# Patient Record
Sex: Female | Born: 1950 | Race: White | Hispanic: No | Marital: Married | Smoking: Never smoker
Health system: Southern US, Community
[De-identification: ages and names within clinical notes are randomized; demographics above are authoritative.]

## PROBLEM LIST (undated history)

## (undated) DIAGNOSIS — F419 Anxiety disorder, unspecified: Secondary | ICD-10-CM

## (undated) DIAGNOSIS — E785 Hyperlipidemia, unspecified: Secondary | ICD-10-CM

## (undated) DIAGNOSIS — D649 Anemia, unspecified: Secondary | ICD-10-CM

## (undated) DIAGNOSIS — C801 Malignant (primary) neoplasm, unspecified: Secondary | ICD-10-CM

## (undated) DIAGNOSIS — K649 Unspecified hemorrhoids: Secondary | ICD-10-CM

## (undated) DIAGNOSIS — M810 Age-related osteoporosis without current pathological fracture: Secondary | ICD-10-CM

## (undated) DIAGNOSIS — K5730 Diverticulosis of large intestine without perforation or abscess without bleeding: Secondary | ICD-10-CM

## (undated) DIAGNOSIS — I10 Essential (primary) hypertension: Secondary | ICD-10-CM

## (undated) DIAGNOSIS — K648 Other hemorrhoids: Secondary | ICD-10-CM

## (undated) DIAGNOSIS — H3530 Unspecified macular degeneration: Secondary | ICD-10-CM

## (undated) DIAGNOSIS — T7840XA Allergy, unspecified, initial encounter: Secondary | ICD-10-CM

## (undated) DIAGNOSIS — Z5189 Encounter for other specified aftercare: Secondary | ICD-10-CM

## (undated) DIAGNOSIS — H269 Unspecified cataract: Secondary | ICD-10-CM

## (undated) HISTORY — DX: Anemia, unspecified: D64.9

## (undated) HISTORY — PX: OTHER SURGICAL HISTORY: SHX169

## (undated) HISTORY — DX: Unspecified macular degeneration: H35.30

## (undated) HISTORY — DX: Encounter for other specified aftercare: Z51.89

## (undated) HISTORY — PX: CHOLECYSTECTOMY: SHX55

## (undated) HISTORY — DX: Other hemorrhoids: K64.8

## (undated) HISTORY — DX: Essential (primary) hypertension: I10

## (undated) HISTORY — DX: Diverticulosis of large intestine without perforation or abscess without bleeding: K57.30

## (undated) HISTORY — DX: Hyperlipidemia, unspecified: E78.5

## (undated) HISTORY — DX: Age-related osteoporosis without current pathological fracture: M81.0

## (undated) HISTORY — PX: COLONOSCOPY: SHX174

## (undated) HISTORY — PX: CATARACT EXTRACTION: SUR2

## (undated) HISTORY — DX: Malignant (primary) neoplasm, unspecified: C80.1

## (undated) HISTORY — DX: Unspecified cataract: H26.9

## (undated) HISTORY — DX: Allergy, unspecified, initial encounter: T78.40XA

## (undated) HISTORY — DX: Anxiety disorder, unspecified: F41.9

## (undated) HISTORY — PX: ABDOMINAL HYSTERECTOMY: SHX81

## (undated) HISTORY — DX: Unspecified hemorrhoids: K64.9

---

## 1974-03-19 DIAGNOSIS — Z5189 Encounter for other specified aftercare: Secondary | ICD-10-CM

## 1974-03-19 HISTORY — DX: Encounter for other specified aftercare: Z51.89

## 2001-07-07 ENCOUNTER — Encounter: Payer: Self-pay | Admitting: Internal Medicine

## 2001-07-07 ENCOUNTER — Encounter: Admission: RE | Admit: 2001-07-07 | Discharge: 2001-07-07 | Payer: Self-pay | Admitting: Internal Medicine

## 2001-09-08 ENCOUNTER — Encounter: Payer: Self-pay | Admitting: Gastroenterology

## 2001-09-08 ENCOUNTER — Encounter: Admission: RE | Admit: 2001-09-08 | Discharge: 2001-09-08 | Payer: Self-pay | Admitting: Gastroenterology

## 2004-02-02 ENCOUNTER — Ambulatory Visit: Payer: Self-pay | Admitting: Internal Medicine

## 2004-03-29 ENCOUNTER — Ambulatory Visit: Payer: Self-pay | Admitting: Internal Medicine

## 2004-07-12 ENCOUNTER — Ambulatory Visit: Payer: Self-pay | Admitting: Internal Medicine

## 2004-08-09 ENCOUNTER — Ambulatory Visit: Payer: Self-pay | Admitting: Internal Medicine

## 2005-03-28 ENCOUNTER — Ambulatory Visit: Payer: Self-pay | Admitting: Internal Medicine

## 2005-04-18 ENCOUNTER — Ambulatory Visit: Payer: Self-pay | Admitting: Internal Medicine

## 2005-05-09 ENCOUNTER — Ambulatory Visit: Payer: Self-pay | Admitting: Internal Medicine

## 2005-07-25 ENCOUNTER — Ambulatory Visit: Payer: Self-pay | Admitting: Family Medicine

## 2006-03-19 HISTORY — PX: COLON SURGERY: SHX602

## 2006-05-07 ENCOUNTER — Ambulatory Visit: Payer: Self-pay | Admitting: Family Medicine

## 2006-07-04 ENCOUNTER — Ambulatory Visit: Payer: Self-pay | Admitting: Internal Medicine

## 2006-08-02 ENCOUNTER — Ambulatory Visit: Payer: Self-pay | Admitting: Gastroenterology

## 2006-08-22 ENCOUNTER — Encounter: Payer: Self-pay | Admitting: Internal Medicine

## 2006-08-22 ENCOUNTER — Ambulatory Visit: Payer: Self-pay | Admitting: Internal Medicine

## 2006-10-02 ENCOUNTER — Ambulatory Visit: Payer: Self-pay | Admitting: Internal Medicine

## 2006-10-02 LAB — CONVERTED CEMR LAB
BUN: 13 mg/dL (ref 6–23)
Creatinine, Ser: 0.9 mg/dL (ref 0.4–1.2)

## 2006-10-07 ENCOUNTER — Ambulatory Visit: Payer: Self-pay | Admitting: Cardiovascular Disease

## 2006-12-19 ENCOUNTER — Encounter: Payer: Self-pay | Admitting: Internal Medicine

## 2006-12-19 ENCOUNTER — Ambulatory Visit: Payer: Self-pay | Admitting: Internal Medicine

## 2006-12-19 DIAGNOSIS — K6389 Other specified diseases of intestine: Secondary | ICD-10-CM

## 2006-12-19 DIAGNOSIS — I10 Essential (primary) hypertension: Secondary | ICD-10-CM | POA: Insufficient documentation

## 2007-01-09 ENCOUNTER — Inpatient Hospital Stay (HOSPITAL_COMMUNITY): Admission: RE | Admit: 2007-01-09 | Discharge: 2007-01-15 | Payer: Self-pay | Admitting: General Surgery

## 2007-01-09 ENCOUNTER — Encounter (INDEPENDENT_AMBULATORY_CARE_PROVIDER_SITE_OTHER): Payer: Self-pay | Admitting: General Surgery

## 2007-01-22 ENCOUNTER — Encounter: Payer: Self-pay | Admitting: Internal Medicine

## 2007-06-03 DIAGNOSIS — E785 Hyperlipidemia, unspecified: Secondary | ICD-10-CM

## 2007-06-03 DIAGNOSIS — Z9089 Acquired absence of other organs: Secondary | ICD-10-CM | POA: Insufficient documentation

## 2007-06-03 DIAGNOSIS — Z8659 Personal history of other mental and behavioral disorders: Secondary | ICD-10-CM

## 2007-06-03 DIAGNOSIS — Z860100 Personal history of colon polyps, unspecified: Secondary | ICD-10-CM | POA: Insufficient documentation

## 2007-06-04 ENCOUNTER — Ambulatory Visit: Payer: Self-pay | Admitting: Internal Medicine

## 2007-06-04 DIAGNOSIS — M79609 Pain in unspecified limb: Secondary | ICD-10-CM

## 2007-06-09 ENCOUNTER — Telehealth: Payer: Self-pay | Admitting: Internal Medicine

## 2007-07-16 ENCOUNTER — Encounter: Payer: Self-pay | Admitting: Internal Medicine

## 2007-07-27 ENCOUNTER — Encounter: Payer: Self-pay | Admitting: Internal Medicine

## 2007-12-15 IMAGING — CT CT PELVIS W/ CM
2 of 5 series · 16 of 46 positions shown, 18 images · IV contrast (omnipaque)
Comparison: CT abdomen and pelvis 09/08/2001 from [REDACTED].

CLINICAL DATA: Left lower quadrant abdominal pain and left pelvic pain.
History of diverticulitis. Remote history of colonic resection in 6953.

CT ABDOMEN AND PELVIS WITH CONTRAST 10/07/2006:
TECHNIQUE: Multidetector helical CT of the abdomen and pelvis was performed
during bolus administration of intravenous contrast. Oral contrast was given.
Delayed imaging through the kidneys was performed.
Contrast:  150 cc Omnipaque 300. Of note, the patient did experience difficulty
swallowing approximately 5 minutes after contrast administration for which she
was given 50 mg of oral Benadryl and 60 mg of intravenous Solu-Medrol per Dr.
Doby.

[Series 2: abd_pel_xxl 5.0 b10f st · axial · 0.95mm/px · z∈[-453,-28]mm · 13 of 96 slices shown, 15 images]
[im 6/96  soft-tissue]
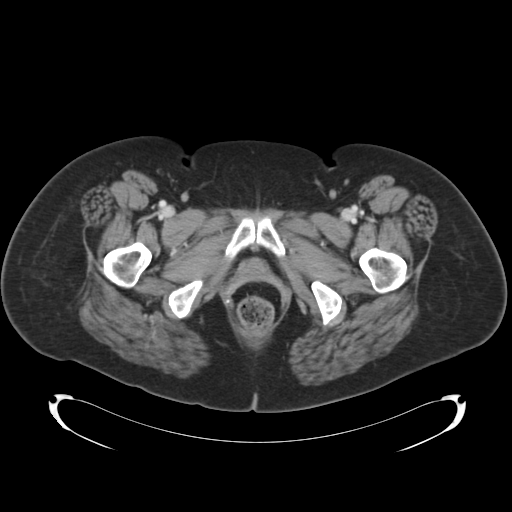
[im 6/96  bone]
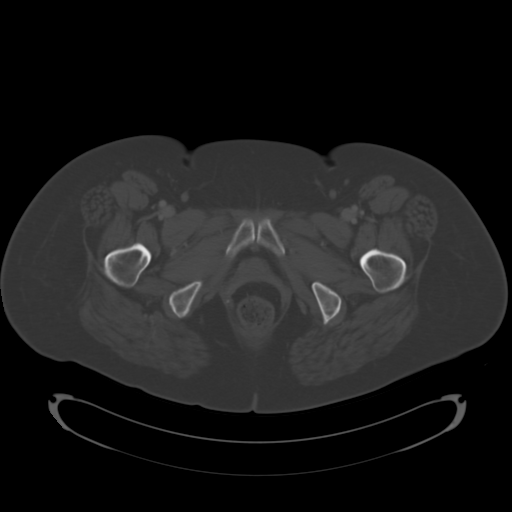
[im 16/96  soft-tissue]
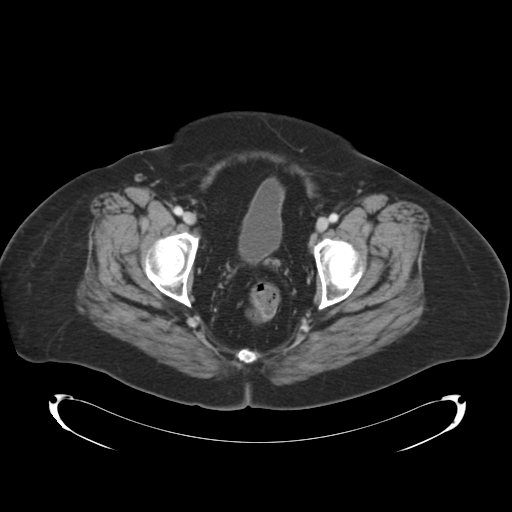
[im 21/96  soft-tissue]
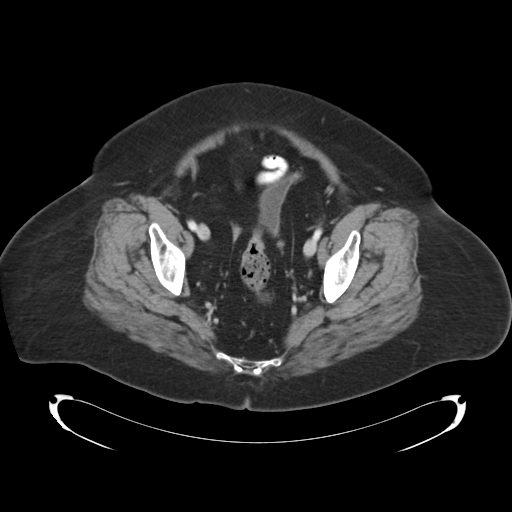
[im 26/96  soft-tissue]
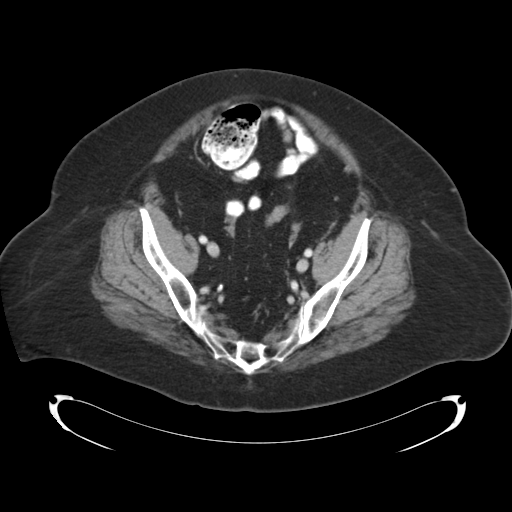
[im 36/96  soft-tissue]
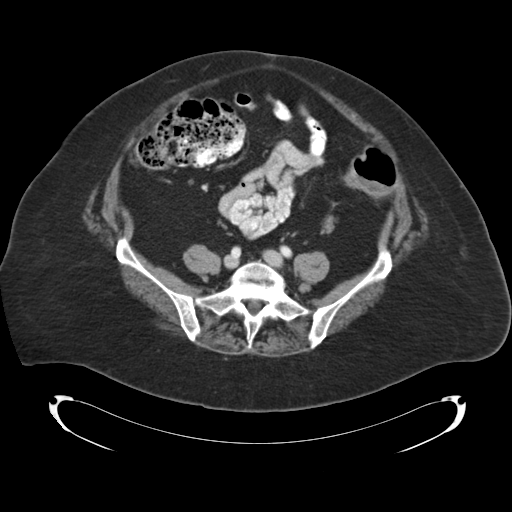
[im 41/96  soft-tissue]
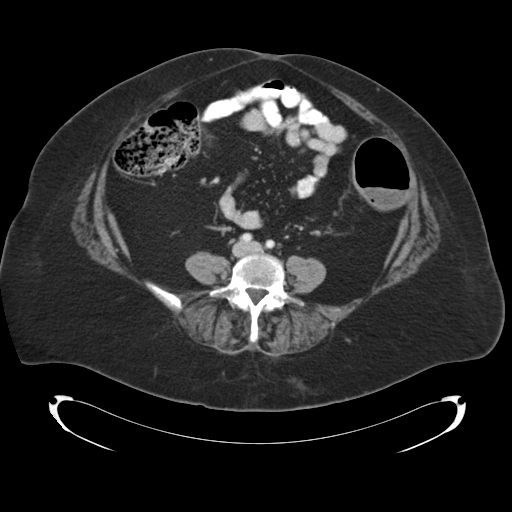
[im 51/96  soft-tissue]
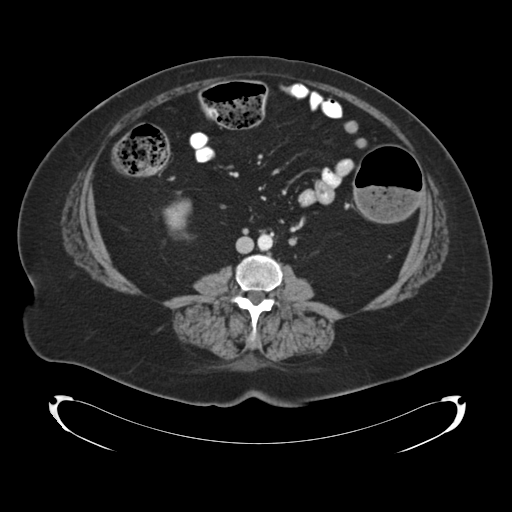
[im 56/96  soft-tissue]
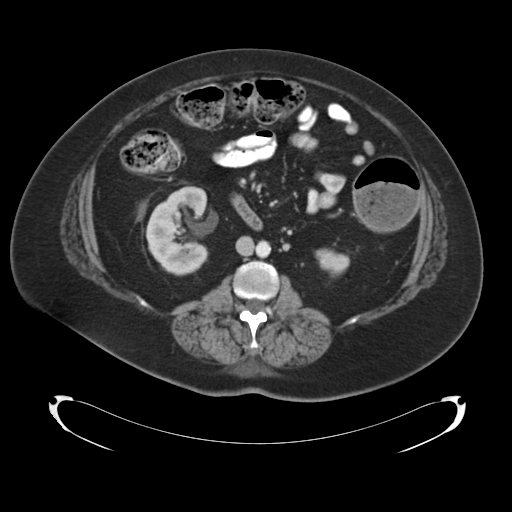
[im 61/96  soft-tissue]
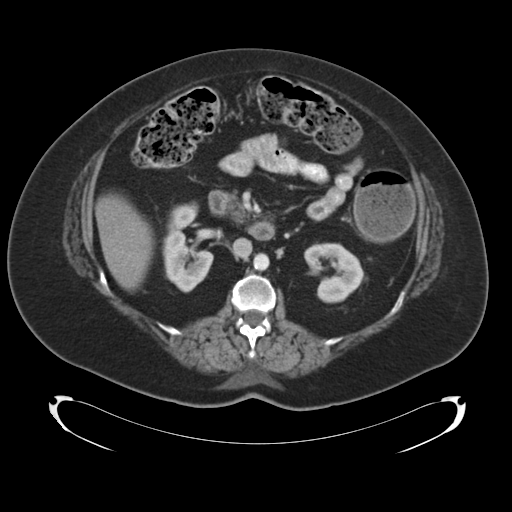
[im 61/96  bone]
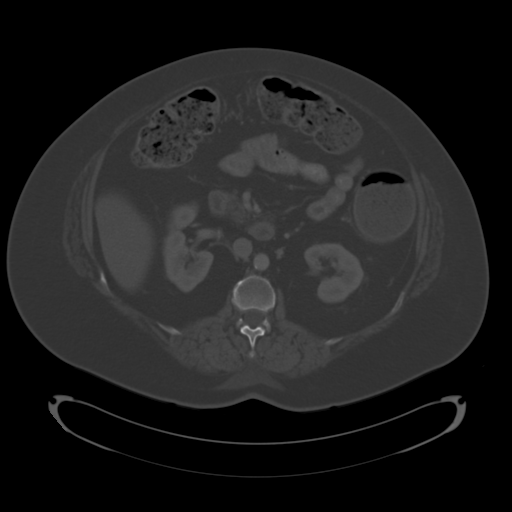
[im 71/96  soft-tissue]
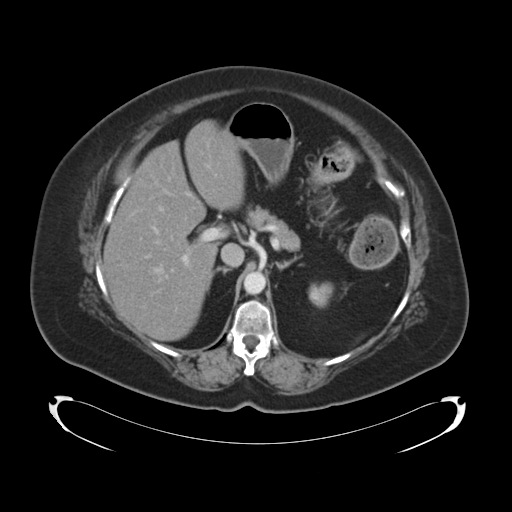
[im 76/96  soft-tissue]
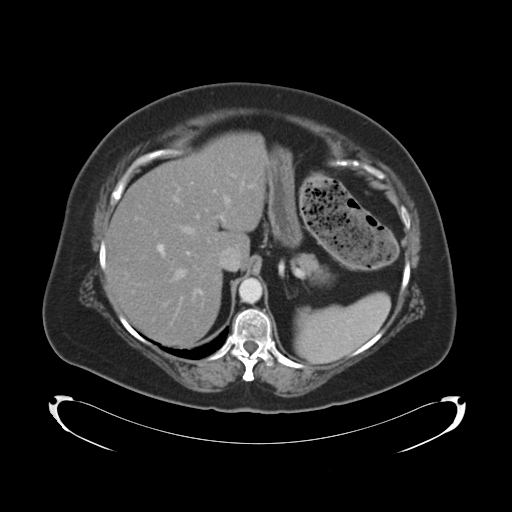
[im 81/96  soft-tissue]
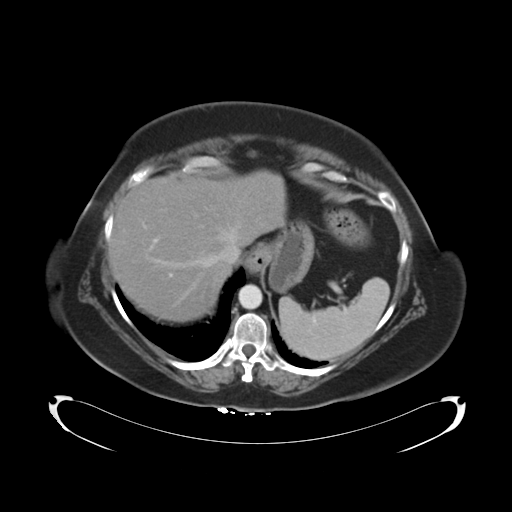
[im 91/96  soft-tissue]
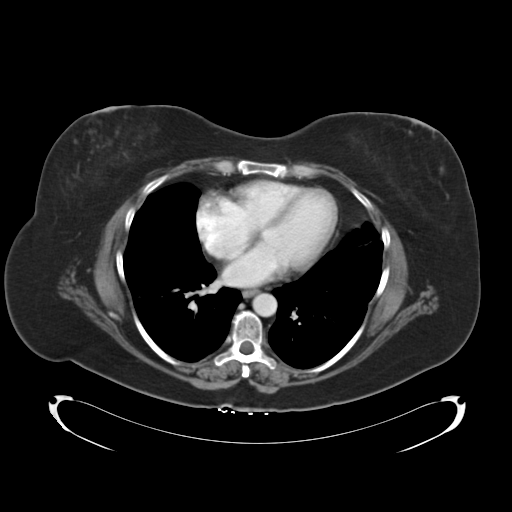

[Series 602: <mpr thick range> · coronal · 0.98mm/px · 3 of 109 slices shown]
[im 37/109  soft-tissue]
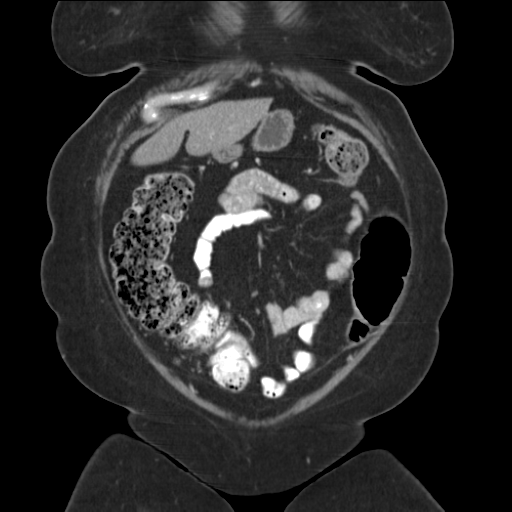
[im 49/109  soft-tissue]
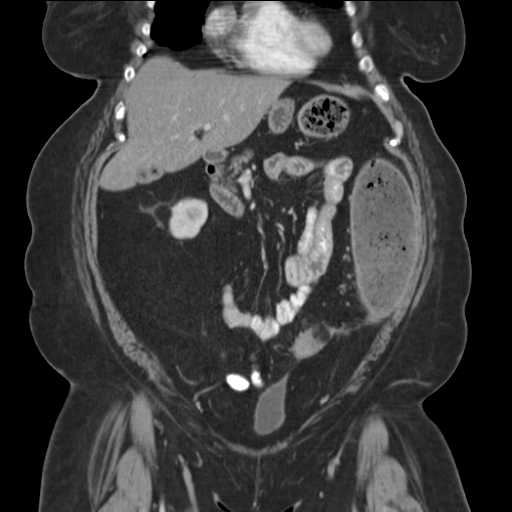
[im 61/109  soft-tissue]
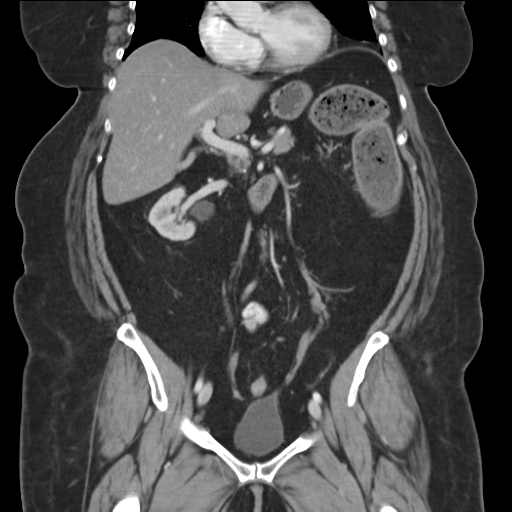

[16 of 46 positions shown; findings below may reference images not displayed]

CT ABDOMEN:

Mild diffuse fatty infiltration of the liver without focal hepatic
abnormalities. Normal appearing spleen, pancreas, adrenal glands, and kidneys.
Numerous cholesterol gallstones again demonstrated within a contracted
gallbladder. Small calcified stone within the cystic duct again demonstrated. No
CT evidence for acute cholecystitis. No biliary ductal dilation. Stomach and
visualized small bowel unremarkable. Large amount of stool throughout the
visualized colon, with scattered diverticula noted involving the ascending and
transverse colon. No ascites. Minimal abdominal aortic atherosclerosis. No
significant lymphadenopathy. Linear scarring again demonstrated in both lower
lobes, the right middle lobe, and lingula. Degenerative changes noted throughout
the lower thoracic and lumbar spine.
IMPRESSION: 1. Mild diffuse fatty infiltration of the liver without focal hepatic
abnormalities.
2. Constipation.
3. Scattered ascending and transverse colonic diverticula without evidence of
acute diverticulitis.
4. Cholelithiasis with cholesterol gallstones in the gallbladder and small
calcified gallstone in the cystic duct, unchanged since the prior CT from August 2001. No CT evidence for cholecystitis or biliary ductal dilation.

CT PELVIS:

Segmental narrowing of the proximal sigmoid colon, and several small diverticula
in this region. Scarring in the soft tissues adjacent to the sigmoid colon, but
no convincing evidence for acute inflammation to confirm acute diverticulitis.
Stool is noted within normal caliber rectum. Descending colon proximal to the
narrowed sigmoid mildly distended containing liquid stool. Visualized small
bowel unremarkable. Uterus surgically absent. No adnexal masses or free fluid.
Urinary bladder somewhat tethered by the scarring in the retroperitoneum but
otherwise unremarkable. No significant lymphadenopathy.
IMPRESSION: 1. Segmental narrowing of the proximal sigmoid colon, with mild distention of
the descending colon proximal to this. Stool is present within normal caliber
distal sigmoid colon and rectum, so high-grade obstruction not suspected. 
2. Scarring from chronic diverticulitis is favored over this representing an
acute diverticulitis, as no pericolonic edema or inflammation is identified.
3. Otherwise unremarkable CT pelvis, status post hysterectomy.

## 2008-03-03 ENCOUNTER — Ambulatory Visit: Payer: Self-pay | Admitting: Internal Medicine

## 2008-03-03 DIAGNOSIS — K432 Incisional hernia without obstruction or gangrene: Secondary | ICD-10-CM | POA: Insufficient documentation

## 2008-07-26 ENCOUNTER — Encounter: Payer: Self-pay | Admitting: Internal Medicine

## 2008-12-22 ENCOUNTER — Ambulatory Visit: Payer: Self-pay | Admitting: Internal Medicine

## 2008-12-22 LAB — CONVERTED CEMR LAB
Albumin: 4 g/dL (ref 3.5–5.2)
Basophils Absolute: 0.1 10*3/uL (ref 0.0–0.1)
Chloride: 98 meq/L (ref 96–112)
Cholesterol: 186 mg/dL (ref 0–200)
Creatinine, Ser: 0.8 mg/dL (ref 0.4–1.2)
Direct LDL: 111.1 mg/dL
HDL: 42 mg/dL (ref >39.00)
Lymphocytes Relative: 26.4 % (ref 12.0–46.0)
Monocytes Relative: 7.2 % (ref 3.0–12.0)
Platelets: 260 10*3/uL (ref 150.0–400.0)
Potassium: 3.7 meq/L (ref 3.5–5.1)
RDW: 12.5 % (ref 11.5–14.6)
Total Protein: 7.9 g/dL (ref 6.0–8.3)
Triglycerides: 268 mg/dL — ABNORMAL HIGH (ref 0.0–149.0)
VLDL: 53.6 mg/dL — ABNORMAL HIGH (ref 0.0–40.0)

## 2009-06-30 ENCOUNTER — Ambulatory Visit: Payer: Self-pay | Admitting: Internal Medicine

## 2009-06-30 DIAGNOSIS — M810 Age-related osteoporosis without current pathological fracture: Secondary | ICD-10-CM | POA: Insufficient documentation

## 2009-07-28 ENCOUNTER — Encounter: Payer: Self-pay | Admitting: Internal Medicine

## 2009-09-09 ENCOUNTER — Telehealth: Payer: Self-pay | Admitting: Internal Medicine

## 2010-04-20 NOTE — Progress Notes (Signed)
Summary: Results  Phone Note Other Incoming   Caller: pt  Summary of Call: Bone Density and mammogram results.  Initial call taken by: Ami Bullins CMA,  September 09, 2009 2:48 PM  Follow-up for Phone Call        both done a [facility] and reviewed; 1) normal bone density 2) normal mammogram Follow-up by: Jacques Navy MD,  September 09, 2009 4:05 PM  Additional Follow-up for Phone Call Additional follow up Details #1::        Patient notified, offered to mail and patient notified that she already has a copy. Additional Follow-up by: Lucious Groves,  September 09, 2009 4:10 PM

## 2010-04-20 NOTE — Assessment & Plan Note (Signed)
Summary: per pt/very stressed and want to see MD-lb   Vital Signs:  Patient profile:   61 year old female Height:      63 inches (160.02 cm) Weight:      229.50 pounds (104.32 kg) BMI:     40.80 O2 Sat:      97 % on Room air Temp:     97.8 degrees F (36.56 degrees C) oral Pulse rate:   59 / minute Pulse rhythm:   regular BP sitting:   120 / 72  (left arm) Cuff size:   large  Vitals Entered By: Brenton Grills (June 30, 2009 9:24 AM)  O2 Flow:  Room air CC: pt here to discuss stress/wants to know if she can have bone density and mammogram on same day since both are due/refill request on clorazepate/aj   Primary Care Provider:  [facility]  CC:  pt here to discuss stress/wants to know if she can have bone density and mammogram on same day since both are due/refill request on clorazepate/aj.  History of Present Illness: Patient presents for management of stress: taxes, husbands back trouble. She does take clorazepate 7.5 mg qid. With this she still is having trouble with anxiety. She does feel overwhelmed and somewhat helpless. She has a history of panic disorder with agraphobia.   She is due for DXA and needs a referral   Her gynecologist has recommended that she take vitamin D. She is not taking any supplementation. She had a pelvic/PAP last year.      Current Medications (verified): 1)  Simvastatin 10 Mg  Tabs (Simvastatin) .... Once Daily 2)  Clorazepate Dipotassium 7.5 Mg  Tabs (Clorazepate Dipotassium) .... 4 Times A Day 3)  Bisoprolol Fumarate 10 Mg Tabs (Bisoprolol Fumarate) .Marland Kitchen.. 1 By Mouth Once Daily 4)  Fosinopril Sodium-Hctz 20-12.5 Mg  Tabs (Fosinopril Sodium-Hctz) .... Once Daily 5)  Bufferin 325 Mg  Tabs (Aspirin Buf(Cacarb-Mgcarb-Mgo)) .... As Needed  Allergies: 1)  ! Sulfa 2)  ! * Minocycline 3)  ! 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) HYPERTENSION (ICD-401.9) ANXIETY, CHRONIC, HX OF (ICD-V11.2)      Past Surgical History: Last updated: 07/04/2007 * LAPAROTOMY AND REMOVAL OF COLON POLYP CHOLECYSTECTOMY, HX OF (ICD-V45.79) * SIGMOID COLECTOMY * Hx of HYSTERECTOMY  Social History: Last updated: 12/19/2006 married 1968 2 sons, 1 daughter   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  The patient denies anorexia, fever, weight loss, weight gain, chest pain, syncope, prolonged cough, abdominal pain, severe indigestion/heartburn, hematuria, muscle weakness, difficulty walking, depression, enlarged lymph nodes, and angioedema.    Physical Exam  General:  alert, well-developed, well-nourished, and well-hydrated.   Head:  normocephalic and atraumatic.   Eyes:  vision grossly intact, pupils equal, pupils round, corneas and lenses clear, and no injection.   Neck:  supple, full ROM, and no thyromegaly.   Lungs:  normal respiratory effort and normal breath sounds.   Heart:  normal rate and regular rhythm.   Abdomen:  obese, BS +, no tenderness Msk:  normal ROM, no joint swelling, no joint warmth, and no joint instability.   Pulses:  2+ radial pulse Neurologic:  alert & oriented X3, cranial nerves II-XII intact, gait normal, and DTRs symmetrical and normal.   Skin:  turgor normal, color normal, no rashes, and no suspicious lesions.   Cervical Nodes:  no anterior cervical adenopathy and no posterior cervical adenopathy.   Psych:  Oriented X3, memory intact for recent and remote, normally interactive, and good eye  contact.     Impression & Recommendations:  Problem # 1:  ANXIETY, CHRONIC, HX OF (ICD-V11.2) Patient with increased anxiety and worry.  Plan - start sertraline 50mg  once daily            continue clorazepate 7.5  Problem # 2:  Preventive Health Care (ICD-V70.0) recommend Vit D supplementation 800iu daily  Complete Medication List: 1)  Simvastatin 10 Mg Tabs (Simvastatin) .... Once daily 2)  Clorazepate Dipotassium 7.5 Mg Tabs (Clorazepate dipotassium) .... 4 times a day 3)  Bisoprolol Fumarate 10 Mg Tabs (Bisoprolol fumarate) .Marland Kitchen.. 1 by mouth once daily 4)  Fosinopril Sodium-hctz 20-12.5 Mg Tabs (Fosinopril sodium-hctz) .... Once daily 5)  Bufferin 325 Mg Tabs (Aspirin buf(cacarb-mgcarb-mgo)) .... As needed 6)  Sertraline Hcl 50 Mg Tabs (Sertraline hcl) .Marland Kitchen.. 1 by mouth once daily  Other Orders: Radiology Referral (Radiology)  Patient Instructions: 1)  anxiety - too much stress and worry. Plan - continue clorazepate. Add sertraline 50mg  daily. 2)  Bone health - you should be getting 1200mg  daily of calcium as diet and supplement combined. You need Vitamin D 400iu twice a day (800 international units daily) Prescriptions: CLORAZEPATE DIPOTASSIUM 7.5 MG  TABS (CLORAZEPATE DIPOTASSIUM) 4 times a day  #360 x 1   Entered and Authorized by:   Jacques Navy MD   Signed by:   Jacques Navy MD on 06/30/2009   Method used:   Print then Give to Patient   RxID:   [id number] SERTRALINE HCL 50 MG TABS (SERTRALINE HCL) 1 by mouth once daily  #30 x 12   Entered and Authorized by:   Jacques Navy MD   Signed by:   Jacques Navy MD on 06/30/2009   Method used:   Print then Give to Patient   RxID:   [id number]

## 2010-05-25 ENCOUNTER — Other Ambulatory Visit: Payer: 59

## 2010-05-25 ENCOUNTER — Encounter: Payer: Self-pay | Admitting: Internal Medicine

## 2010-05-25 ENCOUNTER — Other Ambulatory Visit: Payer: Self-pay | Admitting: Internal Medicine

## 2010-05-25 ENCOUNTER — Ambulatory Visit (INDEPENDENT_AMBULATORY_CARE_PROVIDER_SITE_OTHER): Payer: 59 | Admitting: Internal Medicine

## 2010-05-25 DIAGNOSIS — Z8659 Personal history of other mental and behavioral disorders: Secondary | ICD-10-CM

## 2010-05-25 DIAGNOSIS — N39498 Other specified urinary incontinence: Secondary | ICD-10-CM

## 2010-05-25 DIAGNOSIS — I10 Essential (primary) hypertension: Secondary | ICD-10-CM

## 2010-05-25 DIAGNOSIS — E785 Hyperlipidemia, unspecified: Secondary | ICD-10-CM

## 2010-05-25 DIAGNOSIS — K644 Residual hemorrhoidal skin tags: Secondary | ICD-10-CM

## 2010-05-25 LAB — CBC WITH DIFFERENTIAL/PLATELET
Basophils Relative: 0.6 % (ref 0.0–3.0)
Eosinophils Relative: 3.6 % (ref 0.0–5.0)
Lymphocytes Relative: 24.9 % (ref 12.0–46.0)
MCV: 92.2 fl (ref 78.0–100.0)
Monocytes Relative: 7.9 % (ref 3.0–12.0)
Neutrophils Relative %: 63 % (ref 43.0–77.0)
RBC: 4.2 Mil/uL (ref 3.87–5.11)
WBC: 5.1 10*3/uL (ref 4.5–10.5)

## 2010-05-25 LAB — LIPID PANEL
Cholesterol: 173 mg/dL (ref 0–200)
HDL: 43.5 mg/dL (ref >39.00)
VLDL: 60.4 mg/dL — ABNORMAL HIGH (ref 0.0–40.0)

## 2010-05-25 LAB — HEPATIC FUNCTION PANEL
Albumin: 3.9 g/dL (ref 3.5–5.2)
Alkaline Phosphatase: 82 U/L (ref 39–117)
Total Protein: 7.3 g/dL (ref 6.0–8.3)

## 2010-05-25 LAB — BASIC METABOLIC PANEL
Chloride: 103 mEq/L (ref 96–112)
Creatinine, Ser: 0.9 mg/dL (ref 0.4–1.2)
GFR: 65.26 mL/min (ref >60.00)

## 2010-05-30 NOTE — Assessment & Plan Note (Signed)
Summary: DISCUSS BLADDER LEAKAGE---STC   Vital Signs:  Patient profile:   60 year old female Height:      63 inches Weight:      231 pounds BMI:     41.07 O2 Sat:      97 % on Room air Temp:     98.1 degrees F oral Pulse rate:   63 / minute BP sitting:   132 / 82  (left arm) Cuff size:   large  Vitals Entered By: Bill Salinas CMA (May 25, 2010 10:49 AM)  O2 Flow:  Room air CC: ov to discuss bladder leakage.ab   Primary Care Provider:  Trinitee Horgan  CC:  ov to discuss bladder leakage.ab.  History of Present Illness: Mrs. Paulson prsents with a complaint of incontinence especially when she first gets up. She also has urinary frequency. She is concerned about irritible bladder. No signs of infection.  She last had lab in October '10 and is due. She has been taking her medications.   She is current with mammograms and bone density study.  She has no general medical complaints except as above.   Current Medications (verified): 1)  Simvastatin 10 Mg  Tabs (Simvastatin) .... Once Daily 2)  Clorazepate Dipotassium 7.5 Mg  Tabs (Clorazepate Dipotassium) .... 4 Times A Day 3)  Bisoprolol Fumarate 10 Mg Tabs (Bisoprolol Fumarate) .Marland Kitchen.. 1 By Mouth Once Daily 4)  Fosinopril Sodium-Hctz 20-12.5 Mg  Tabs (Fosinopril Sodium-Hctz) .... Once Daily 5)  Bufferin 325 Mg  Tabs (Aspirin Buf(Cacarb-Mgcarb-Mgo)) .... As Needed 6)  Sertraline Hcl 50 Mg Tabs (Sertraline Hcl) .Marland Kitchen.. 1 By Mouth Once Daily  Allergies (verified): 1)  ! Sulfa 2)  ! * Minocycline 3)  ! 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) HYPERTENSION (ICD-401.9) ANXIETY, CHRONIC, HX OF (ICD-V11.2)      Past Surgical History: Last updated: 07/04/2007 *  LAPAROTOMY AND REMOVAL OF COLON POLYP CHOLECYSTECTOMY, HX OF (ICD-V45.79) * SIGMOID COLECTOMY * Hx of HYSTERECTOMY  Family History: Last updated: July 04, 2009 father - deceased @ 14: CAD/MI, DM mother - 1932: dillusional disorder, fibromyalgia, DDD, HTN Neg- colon cancer MAunt - breast cancer Brother - DM  Social History: Last updated: 12/19/2006 married 1968 2 sons, 1 daughter   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    Physical Exam  General:  overweight white woman in no distress Head:  normocephalic and atraumatic.   Eyes:  C&S clear Neck:  supple and full ROM.   Lungs:  normal respiratory effort, normal breath sounds, no crackles, and no wheezes.   Heart:  normal rate and regular rhythm.   Abdomen:  Bowel sounds positive,abdomen soft and non-tender without masses, organomegaly or hernias noted. Msk:  normal ROM, no joint tenderness, no joint swelling, and no joint warmth.   Pulses:  2+  radial pulse Extremities:  No peripheral edema Neurologic:  alert & oriented X3, cranial nerves II-XII intact, strength normal in all extremities, gait normal, and DTRs symmetrical and normal.   Skin:  turgor normal and color normal.   Psych:  Oriented X3, memory intact for recent and remote, normally interactive,  and good eye contact.     Impression & Recommendations:  Problem # 1:  DEPRESSION, CHRONIC, HX OF (ICD-V11.8) Stable - meds renewed  Problem # 2:  DYSLIPIDEMIA (ICD-272.4) Due for lab with recommendations to follow.  Her updated medication list for this problem includes:    Simvastatin 10 Mg Tabs (Simvastatin) ..... Once daily  Orders: TLB-Lipid Panel (80061-LIPID) TLB-Hepatic/Liver Function Pnl  (80076-HEPATIC)  Addendum - LDL 99 - at goal  Plan - continue present meds  Problem # 3:  HYPERTENSION (ICD-401.9)  Her updated medication list for this problem includes:    Bisoprolol Fumarate 10 Mg Tabs (Bisoprolol fumarate) .Marland Kitchen... 1 by mouth once daily    Fosinopril Sodium-hctz 20-12.5 Mg Tabs (Fosinopril sodium-hctz) ..... Once daily  Orders: TLB-BMP (Basic Metabolic Panel-BMET) (80048-METABOL)  BP today: 132/82 Prior BP: 120/72 (06/30/2009)  Adequate control. Will check basic metabolic panel  Problem # 4:  STRESS INCONTINENCE (ICD-788.39) By descripton patient has stress incontinence. she is concerned for irritible bladder having seen an add and clipped a coupon for a 30 day supply of vesicare.   Plan - patient needs to do [place] exercise (available by going to [employer])           trial of vesicare 5mg  once daily (#7 given as sample)  Complete Medication List: 1)  Simvastatin 10 Mg Tabs (Simvastatin) .... Once daily 2)  Clorazepate Dipotassium 7.5 Mg Tabs (Clorazepate dipotassium) .... 4 times a day 3)  Bisoprolol Fumarate 10 Mg Tabs (Bisoprolol fumarate) .Marland Kitchen.. 1 by mouth once daily 4)  Fosinopril Sodium-hctz 20-12.5 Mg Tabs (Fosinopril sodium-hctz) .... Once daily 5)  Bufferin 325 Mg Tabs (Aspirin buf(cacarb-mgcarb-mgo)) .... As needed 6)  Sertraline Hcl 50 Mg Tabs (Sertraline hcl) .Marland Kitchen.. 1 by mouth once daily  Other Orders: TLB-CBC Platelet - w/Differential (85025-CBCD)  Patient: Stephanie Pham Note: All result statuses are Final unless otherwise noted.  Tests: (1) Lipid Panel (LIPID)   Cholesterol               173 mg/dL                   0-454     ATP III Classification            Desirable:  < 200 mg/dL                    Borderline High:  200 - 239 mg/dL               High:  > = 240 mg/dL   Triglycerides        [H]  302.0 mg/dL                 0.9-811.9     Normal:  <150 mg/dL     Borderline High:  147 - 199 mg/dL   HDL                       82.95 mg/dL                  >62.13   VLDL Cholesterol     [H]  60.4 mg/dL                  0.8-65.7  CHO/HDL Ratio:  CHD Risk                             4  Men          Women     1/2 Average Risk     3.4          3.3     Average Risk          5.0          4.4     2X Average Risk          9.6          7.1     3X Average Risk          15.0          11.0                           Tests: (2) Hepatic/Liver Function Panel (HEPATIC)   Total Bilirubin           0.9 mg/dL                   7.5-6.4   Direct Bilirubin          0.2 mg/dL                   3.3-2.9   Alkaline Phosphatase      82 U/L                      39-117   AST                  [H]  57 U/L                      0-37   ALT                  [H]  67 U/L                      0-35   Total Protein             7.3 g/dL                    5.1-8.8   Albumin                   3.9 g/dL                    4.1-6.6  Tests: (3) BMP (METABOL)   Sodium                    141 mEq/L                   135-145   Potassium                 3.7 mEq/L                   3.5-5.1   Chloride                  103 mEq/L                   96-112   Carbon Dioxide            29 mEq/L                    19-32   Glucose  99 mg/dL                    16-10   BUN                       10 mg/dL                    9-60   Creatinine                0.9 mg/dL                   4.5-4.0   Calcium                   9.0 mg/dL                   9.8-11.9   GFR                       65.26 mL/min                >60.00  Tests: (4) CBC Platelet w/Diff (CBCD)   White Cell Count          5.1 K/uL                    4.5-10.5   Red Cell Count            4.20 Mil/uL                 3.87-5.11   Hemoglobin                13.5 g/dL                   14.7-82.9   Hematocrit                38.8 %                      36.0-46.0   MCV                       92.2 fl                     78.0-100.0   MCHC                      34.8 g/dL                   56.2-13.0   RDW                        13.1 %                      11.5-14.6   Platelet Count            265.0 K/uL                  150.0-400.0   Neutrophil %              63.0 %                      43.0-77.0   Lymphocyte %              24.9 %  12.0-46.0   Monocyte %                7.9 %                       3.0-12.0   Eosinophils%              3.6 %                       0.0-5.0   Basophils %               0.6 %                       0.0-3.0   Neutrophill Absolute      3.2 K/uL                    1.4-7.7   Lymphocyte Absolute       1.3 K/uL                    0.7-4.0   Monocyte Absolute         0.4 K/uL                    0.1-1.0  Eosinophils, Absolute                             0.2 K/uL                    0.0-0.7   Basophils Absolute        0.0 K/uL                    0.0-0.1  Tests: (5) Cholesterol LDL - Direct (DIRLDL)  Cholesterol LDL - Direct                             99.7 mg/dL     Optimal:  <161 mg/dL     Near or Above Optimal:  100-129 mg/dL     Borderline High:  096-045 mg/dL     High:  409-811 mg/dL     Very High:  >914 mg/dL Prescriptions: CLORAZEPATE DIPOTASSIUM 7.5 MG  TABS (CLORAZEPATE DIPOTASSIUM) 4 times a day  #360 x 1   Entered and Authorized by:   Jacques Navy MD   Signed by:   Jacques Navy MD on 05/25/2010   Method used:   Print then Give to Patient   RxID:   [id number]    Orders Added: 1)  TLB-Lipid Panel [80061-LIPID] 2)  TLB-Hepatic/Liver Function Pnl [80076-HEPATIC] 3)  TLB-BMP (Basic Metabolic Panel-BMET) [80048-METABOL] 4)  TLB-CBC Platelet - w/Differential [85025-CBCD] 5)  Est. Patient Level IV [69629]

## 2010-06-05 ENCOUNTER — Telehealth: Payer: Self-pay | Admitting: Internal Medicine

## 2010-06-15 NOTE — Progress Notes (Signed)
Summary: Rx request  Phone Note Call from Patient Call back at Home Phone [phone]   Caller: Patient Reason for Call: Refill Medication Summary of Call: Pt states that she was given samples of Vesicare to try for bladder incontinence. Pt states that this med is working and is requesting that a #30 Rx be called into [facility] [phone]. Is Ok to send Rx to pharmacy.? Initial call taken by: Burnard Leigh [facility]),  June 05, 2010 10:49 AM  Follow-up for Phone Call        ok for vesicare 5mg  # 30 , sig 1 by mouth once daily; as needed refills Follow-up by: Jacques Navy MD,  June 05, 2010 12:57 PM    New/Updated Medications: VESICARE 5 MG TABS (SOLIFENACIN SUCCINATE) 1 once daily Prescriptions: VESICARE 5 MG TABS (SOLIFENACIN SUCCINATE) 1 once daily  #90 x 3   Entered by:   Lamar Sprinkles, CMA   Authorized by:   Jacques Navy MD   Signed by:   Lamar Sprinkles, CMA on 06/05/2010   Method used:   Electronically to        [facility]  [address] [phone]* (retail)       [address]       [place], [place]  [address]       Ph: [phone]       Fax: [phone]   RxID:   [id number]

## 2010-08-01 NOTE — Discharge Summary (Signed)
Stephanie Pham, Stephanie Pham              ACCOUNT NO.:  [id number]   MEDICAL RECORD NO.:  [id number]          PATIENT TYPE:  INP   LOCATION:  5742                         FACILITY:  [facility]   PHYSICIAN:  Gabrielle Dare. Janee Morn, M.D.DATE OF BIRTH:  [birth year]   DATE OF ADMISSION:  01/09/2007  DATE OF DISCHARGE:  01/15/2007                               DISCHARGE SUMMARY   DISCHARGE DIAGNOSES:  1. Symptomatic cholelithiasis.  2. 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  3. Status post cholecystectomy and sigmoid colectomy.  4. Hypertension.  5. Anxiety disorder.   HISTORY OF PRESENT ILLNESS:  Stephanie Pham is a 60 year old female who I  evaluated in the office for several mild bouts of diverticulitis.  A CT  scan revealed a stricture in her proximal sigmoid colon.  In addition,  she has had gallstones for some time with symptoms on an intermittent  basis.  She presented for elective cholecystectomy and sigmoid  colectomy.   HOSPITAL COURSE:  The patient underwent an uncomplicated cholecystectomy  and sigmoid colectomy, but did require some adhesiolysis and  mobilization of the splenic flexure.  However, primary anastomosis was  done.  Postoperatively, pain was well controlled.  She had the expected  postoperative ileus for 3-4 days.  Nasogastric tube was left in  overnight and removed the following day.  Hypertension was controlled by  phasing in her bisoprolol/HCT and fosinopril.  However initially, these  needed to be held as her pressure ran around 95 systolic.  She continued  to maintain excellent urine output and was afebrile throughout her stay.  Follow up CBCs were normal.  Her ileus resolved.  She tolerated gradual  advancement of her diet and mobilized quite well.  She had multiple  bowel movements.  She is discharged on postoperative day 6 in stable  condition.   DISCHARGE DIET:  Regular.   DISCHARGE ACTIVITY:  No lifting.   DISCHARGE MEDICATIONS:  1. Percocet 5/325 one  every 6 hours as needed for pain.  2. In addition, she is to resume her home medications:  Simvastatin 10      mg daily.  3. Clorazepate 7.5 mg p.o. q.i.d.  4. Bisoprolol/HCT 10/6.25 one daily.  5. Fosinopril 20/12.5 daily.  6. Nexium 40 mg daily.   FOLLOW UP:  Followup is in 1 week with myself.      Gabrielle Dare Janee Morn, M.D.  Electronically Signed     BET/MEDQ  D:  01/15/2007  T:  01/15/2007  Job:  [id number]   cc:   Rosalyn Gess. Norins, MD  Iva Boop, MD,FACG

## 2010-08-01 NOTE — Op Note (Signed)
NAMEKEMARIA, DEDIC              ACCOUNT NO.:  [id number]   MEDICAL RECORD NO.:  [id number]          PATIENT TYPE:  INP   LOCATION:  2550                         FACILITY:  [facility]   PHYSICIAN:  Gabrielle Dare. Janee Morn, M.D.DATE OF BIRTH:  May 07, 1950   DATE OF PROCEDURE:  01/09/2007  DATE OF DISCHARGE:                               OPERATIVE REPORT   PREOPERATIVE DIAGNOSES:  1. Symptomatic cholelithiasis.  2. 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   POSTOPERATIVE DIAGNOSES:  1. Symptomatic cholelithiasis.  2. 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   PROCEDURE:  1. Cholecystectomy.  2. Sigmoid colectomy.  3. Mobilization of splenic flexure.  4. Lysis of adhesions for 30 minutes.   SURGEON:  Gabrielle Dare. Janee Morn, M.D.   ASSISTANT:  Adolph Pollack, M.D.   ANESTHESIA:  General.   HISTORY OF PRESENT ILLNESS:  Ms.  Zarling is a 60 year old female who I  evaluated in the office for several mild bouts of diverticulitis.  A CT  scan revealed a stricture in her proximal sigmoid colon.  In addition,  she has had gallstones for some time and has been having intermittent  symptoms from those.  She underwent a bowel prep which she had some  difficulty tolerating, but was able to take and presents for elective  cholecystectomy and sigmoid colon resection.   PROCEDURE IN DETAIL:  Informed consent was obtained.  The patient was  identified in the preoperative holding area.  She received intravenous  antibiotics.  She was brought to the operating room.  General anesthesia  was administered by the anesthesia staff.  She had very poor intravenous  access so the anesthesia staff went on to place an internal jugular  central line as well.  Her abdomen was prepped and draped in a sterile  fashion.  Midline incision was made.  Subcutaneous tissues were  dissected down revealing the anterior fascia.  This was divided sharply  and the peritoneal cavity was gradually entered  under direct vision  without difficulty.  The fascia was opened to the length of the  incision.  Exploration revealed a large amount of filmy adhesions on  both sides of the abdomen.  These were mostly to the omentum.  Gradual  careful adhesiolysis was then accomplished for a total of approximately  30 minutes.  We initially were able to gain access into the right upper  quadrant.  Further adhesiolysis from her previous colon surgery was  necessary.  We then placed the Bookwalter retractor.  The dome of the  gallbladder was retracted superiorly and the gallbladder was dissected  off the liver bed with Bovie cautery getting excellent hemostasis.  We  continued down to the infundibulum.  The cystic artery was identified  and clipped 3 times proximally and then divided distally.  The cystic  duct was still quite large as it tapered from the infundibulum so it did  not seem safe to occlude it with a clip.  We did visualize further down  to the junction of the common bile duct.  We stayed well above the  cystic duct and then divided the cystic  duct with a TA-30 stapler.  The  anterior branch of the cystic artery was then clipped twice proximally  achieving excellent hemostasis.  The gallbladder was removed.  The area  around the liver bed was copiously irrigated.  Hemostasis was assured.   Attention was then directed to the left lower quadrant.  There was still  a matted wad of omentum adherent to her proximal sigmoid colon with  evidence of extreme chronic inflammation with the colon proximal to this  area being quite dilated.  The proximal colon was checked and a healthy  area was selected.  It was circumferentially dissected above the woody  inflammation and divided with a GIA-75 stapler.  We then carefully  gradually took down the inflamed sigmoid colon from the lateral  peritoneal attachments.  We stayed right along the colon wall especially  along the deep area to avoid the ureter.   There has been a significant  amount of chronic inflammation in this area.  We continued this careful,  slow dissection, taking the mesentery down with the LigaSure and clamps  and 2-0 silk ties as needed.  The dissection continued down until the  healthy distal sigmoid that was not diseased whatsoever.  This was  dissected circumferentially.  The mesentery taken down as previously and  then we divided the area below all of the disease with a GIA-75 stapler.  The specimen was marked for pathology and passed off.   At this time inspecting each end of the colon, it was felt unsafe to do  an end-to-end hand sewn anastomosis due to the disparity of the size of  the lumens, therefore, we mobilized the left colon from its lateral  peritoneal attachments and we gradually worked our way up and mobilized  the splenic flexure, staying away from the spleen and not having any  bleeding complications, bringing down the splenic flexure.  This gave [place]  a lot of length of left colon facilitating a side to side anastomosis.  We then created a side-to-side anastomosis of the left colon to the  distal sigmoid colon with a GIA-75 stapler.  A crotch stitch of 2-0 silk  was placed prior to firing.  The resultant enterotomy was then closed in  a hand sewn fashion as this was then safest choice due to the anatomy.  This was done in 2 layers with the enterotomy closed with running 3-0  Vicryl suture and then a series of interrupted 3-0 silk Lemberts were  placed, burying that suture line completely.  The anastomosis was patent  and palpable.  Hemostasis in the area was achieved.  There was no  significant mesenteric rent to close.  Please note the addition that  prior to completing our side-to-side anastomosis, the distal 2 cm of the  left colon which had appeared very slightly dusky was again divided off  with the GIA-75 stapler, leaving a viable and healthy appearing end of  the colon.  The abdomen was then  copiously irrigated.  All laparotomy  packs were removed.  Left upper quadrant and right upper quadrant were  doubly checked for hemostasis.  Prior to irrigating, we changed our  gloves.  Bowel was returned to anatomic position.  The NG tube was  verified to be in good position.  The omentum was brought back down over  the bowel and the distal portion was draped over our anastomosis.  The  fascia was then closed with 2 links of running #1 looped PDS including a  repair of her small umbilical hernia during the closure and the  subcutaneous tissues were irrigated and the skin was closed with  staples.  Sponge, needle, and instrument counts were all correct.  Sterile dressings were applied.  The patient tolerated the procedure  well without apparent complication and was taken to the recovery room in  stable condition.      Gabrielle Dare Janee Morn, M.D.  Electronically Signed     BET/MEDQ  D:  01/09/2007  T:  01/10/2007  Job:  [id number]   cc:   Rosalyn Gess. Norins, MD  Iva Boop, MD,FACG

## 2010-08-01 NOTE — Letter (Signed)
December 19, 2006    Gabrielle Dare. Janee Morn, M.D.  [facility]  [address] [place], [place] [address]   RE:  [name], [name]  MRN:  [id number]  /  DOB:  [birth year]   Dear Dr. Janee Morn:   I understand [name] is being scheduled for resection of her colonic  stricture with a cholecystectomy and hernia repair, at the same  intervention.   I have seen the patient today and examined her.  I show that she is  medically fit and stable for general anesthesia and surgery.  She did  have a 12-lead electrocardiogram in our office in 2007 which was normal.  A followup EKG by anesthesiology pre-op would certainly be appropriate.   Enclosed with this letter, would be a copy of my computer-generated  office note from December 19, 2006, which outlines the patient's past  medical history.   If I can provide any additional information, please do not hesitate to  contact me.    Sincerely,      Stephanie Gess. Norins, MD  Electronically Signed    MEN/MedQ  DD: 12/19/2006  DT: 12/19/2006  Job #: [id number]

## 2010-08-01 NOTE — Assessment & Plan Note (Signed)
[facility]                            CARDIOLOGY OFFICE NOTE   NAME:Kleinman, EVOLETH NORDMEYER                     MRN:          [id number]  DATE:10/07/2006                            DOB:          03-Sep-1950    [name] is a 61 year old female that presented to the office today  for a CT scan of her abdomen for possible diverticulitis.  The study was  performed with contrast.  Immediately following the study she complained  of difficulty swallowing.  She also felt that her tongue was swelling.  There was no chest pain or shortness of breath.  She had no other  complaints.   She had a blood pressure of 130/82 and her pulse was 76.  Her  respiratory rate was 16.  Her saturations were 96 to 97%.  HEENT:  Normal on exam.  NECK:  No stridor.  CHEST:  Clear with no evidence of wheezing.  CARDIOVASCULAR:  She had a regular rate and rhythm.  EXTREMITIES:  Showed no edema.   We initially gave the patient Benadryl 50 mg p.o. as her IV had been  removed. However, her symptoms persisted and an IV was placed and she  received 60 mg of Solu Medrol for probable contrast allergy. She was  watched in the office for approximately 1-2 hours following the  procedure. At the time of discharge her symptoms of difficulty  swallowing and tongue swelling had improved.  She continued to deny any  shortness of breath.  We discussed that this was most likely a contrast  allergy and that she should be premedicated in the future prior to any  procedures that involve contrast.     Madolyn Frieze. Jens Som, MD, [facility]  Electronically Signed    BSC/MedQ  DD: 10/07/2006  DT: 10/07/2006  Job #: [id number]   cc:   Iva Boop, MD,FACG

## 2010-08-01 NOTE — Assessment & Plan Note (Signed)
[facility]                         GASTROENTEROLOGY OFFICE NOTE   NAME:Stephanie Pham, Stephanie Pham                     MRN:          [id number]  DATE:10/02/2006                            DOB:          1950/06/22    GASTROENTEROLOGY NOTE   CHIEF COMPLAINT:  Abdominal pain, diverticulosis, and colonic stricture.   I had Ms. Noy see me.  I performed a colonoscopy August 22, 2006 for  surveillance of colorectal cancer many years ago.  Dr. Corinda Gubler and I, as  well as Dr. Debby Bud have questioned whether or not she truly has had  colon cancer based upon today's standards.  She had a right colon  resection.  She has a stricture in the sigmoid colon and has lots of  recurrent pain in the left lower quadrant and worsening constipation.  She has had diverticulitis in the past.  Biopsies from the stricture  showed no active colitis.  She had some inflammatory changes there.  The  colon was very fixed in that area.  She had a tubular adenoma in the  cecum.   Her GI review of systems at this time is notable for bloating,  constipation, and gas.  She has some history of heartburn and  indigestion as well.  The heartburn is rare and intermittent.  She does  report a history of gall stones, though I do not know that they are  symptomatic at this point.   MEDICATIONS:  1. Simvastatin 10 mg daily.  2. Clorazepate 7.5 mg 4 daily.  3. Bisoprolol hydrochlorothiazide 10/6.25 mg daily.  4. Lisinopril 20 mg daily.   ALLERGIES:  SULFA and MINOCYCLINE.   PAST MEDICAL HISTORY:  1. Chronic anxiety.  2. Hypertension.  3. Prior hysterectomy.  4. Prior laparotomy and removal of colon polyp, question cancer at      hepatic flexure many years ago.  We do not have pathology on that.      She was told she had cancer.  5. Diverticulosis with stricture formation in the sigmoid.  6. Internal hemorrhoids.  7. Adenomatous colon polyp.  8. Obesity.  9. Dyslipidemia.   FAMILY HISTORY:   Breast cancer in an aunt.  Father and brother had  diabetes and heart disease in her father.   ADDITIONAL MEDICAL PROBLEMS:  Chronic depression and anxiety.   SOCIAL HISTORY:  She is married.  She is on disability.  She lives with  her husband, who is about to probably have C spine surgery again.  No  alcohol, tobacco, or drugs.   REVIEW OF SYSTEMS:  Allergies, pedal edema, dyspnea at times.  All other  systems negative.   PHYSICAL EXAM:  Reveals an obese white woman.  Height 5 feet 4 inches, weight 230 pounds, blood pressure 124/78, pulse  60.  EYES:  Anicteric.  ABDOMEN:  Obese and soft.  She is tender in the left lower quadrant and  groin/pelvic area with deep palpation.  There is no rebound.  There are  multiple surgical scars.  SKIN:  Looks to be free of any acute rash in that area.  She is alert and oriented x3.  Ears and nose look normal to external inspection.   ASSESSMENT:  This lady has a diverticular stricture, I think.  Probably  from repeated episodes of diverticulitis.  She may even have some  diverticulitis now.  She had some tenderness and worsening bowel habits  related to this.  I think surgical evaluation would be appropriate.  She  also relates to me a history of gall stones, though I do not have that  information at hand right now.  They do not seem to be symptomatic.  She  says that she has had repeated abnormal urinalyses at Dr. Marzetta Board  office with her gynecologic visits.  Perhaps that could be related to  sympathetic irritation of the bladder from the diverticular disease.   RECOMMENDATIONS AND PLAN:  1. CT of the abdomen and pelvis with IV and oral contrast to evaluate      for persistent diverticulitis.  2. She may very well need a barium enema.  3. Surgical referral to Dr. Violeta Gelinas for consideration of      surgical treatment of her disease.  She was told to ask him about      the gall stone issue (i.e., if she had a laparotomy, would       cholecystectomy make sense).  4. Further plans pending clinical course.  She may need a round of      antibiotics.  I would not do that empirically at this time.  5. Repeat colonoscopy in 5 years because of the adenomatous polyp in      the cecum.  6. MiraLax to be used daily to help promote better bowel movements at      this time.     Iva Boop, MD,FACG  Electronically Signed    CEG/MedQ  DD: 10/02/2006  DT: 10/03/2006  Job #: [id number]   cc:   Rosalyn Gess. Norins, MD  Ilda Mori, M.D.  Gabrielle Dare Janee Morn, M.D.

## 2010-08-07 ENCOUNTER — Encounter: Payer: Self-pay | Admitting: Internal Medicine

## 2010-11-07 ENCOUNTER — Ambulatory Visit (INDEPENDENT_AMBULATORY_CARE_PROVIDER_SITE_OTHER): Payer: 59 | Admitting: Internal Medicine

## 2010-11-07 ENCOUNTER — Encounter: Payer: Self-pay | Admitting: Internal Medicine

## 2010-11-07 DIAGNOSIS — E785 Hyperlipidemia, unspecified: Secondary | ICD-10-CM

## 2010-11-07 DIAGNOSIS — Z8659 Personal history of other mental and behavioral disorders: Secondary | ICD-10-CM

## 2010-11-07 MED ORDER — FOSINOPRIL SODIUM-HCTZ 20-12.5 MG PO TABS: 1.0000 | ORAL_TABLET | Freq: Every day | ORAL | Status: DC

## 2010-11-07 MED ORDER — CLORAZEPATE DIPOTASSIUM 7.5 MG PO TABS: 7.5000 mg | ORAL_TABLET | Freq: Four times a day (QID) | ORAL | Status: DC

## 2010-11-07 MED ORDER — BISOPROLOL FUMARATE 10 MG PO TABS: 10.0000 mg | ORAL_TABLET | Freq: Every day | ORAL | Status: DC

## 2010-11-07 MED ORDER — SIMVASTATIN 10 MG PO TABS: 10.0000 mg | ORAL_TABLET | Freq: Every day | ORAL | Status: DC

## 2010-11-08 NOTE — Assessment & Plan Note (Signed)
Heightened anxiety with husbands up-coming surgery. Refill on meds.

## 2010-11-08 NOTE — Assessment & Plan Note (Signed)
Due for lab in 6-8 weeks. Recommendation to follow - watching tgy's

## 2010-11-08 NOTE — Progress Notes (Signed)
  Subjective:    Patient ID: Stephanie Pham, female    DOB: [birth year], 60 y.o.   MRN: [id number]  HPI Stephanie Pham presents for medication refill only. She has been doing well with no change in her medical condition. Her husband has been ill and is having TKR August 24th. She was last seen March 8, '12. Labs at that time revealed elevated Tgy and minimal elevation of AST/ALT. She will be due for lab in September or October.   Review of Systems System review is negative for any constitutional, cardiac, pulmonary, GI or neuro symptoms or complaints       Objective:   Physical Exam Vitals stable Neuro - A&O x 3 Cor- RRR Chest- Clear        Assessment & Plan:

## 2010-12-11 ENCOUNTER — Other Ambulatory Visit: Payer: Self-pay

## 2010-12-11 MED ORDER — SOLIFENACIN SUCCINATE 5 MG PO TABS: 5.0000 mg | ORAL_TABLET | Freq: Every day | ORAL | Status: DC

## 2010-12-11 NOTE — Telephone Encounter (Signed)
Pt informed

## 2010-12-11 NOTE — Telephone Encounter (Signed)
Ok to renew vesicare Rx #11

## 2010-12-11 NOTE — Telephone Encounter (Signed)
Pt called requesting Rx for Vesicare 5 mg # 30 x 5 to [facility] [address]. Pt was previous given samples. Okay to fill?

## 2010-12-27 LAB — COMPREHENSIVE METABOLIC PANEL
ALT: 81 — ABNORMAL HIGH
Albumin: 4.1
BUN: 8
CO2: 27
Calcium: 8.2 — ABNORMAL LOW
Creatinine, Ser: 0.8
Creatinine, Ser: 0.82
GFR calc non Af Amer: >60
Glucose, Bld: 167 — ABNORMAL HIGH
Sodium: 138
Total Bilirubin: 0.9
Total Protein: 7.5

## 2010-12-27 LAB — CBC
HCT: 38.3
Hemoglobin: 10.7 — ABNORMAL LOW
Hemoglobin: 11.2 — ABNORMAL LOW
MCHC: 33.7
MCHC: 33.9
MCHC: 34.1
MCV: 92.1
MCV: 92.7
Platelets: 298
Platelets: 334
RBC: 3.36 — ABNORMAL LOW
RBC: 3.38 — ABNORMAL LOW
RBC: 3.6 — ABNORMAL LOW
RDW: 13.2
WBC: 10.9 — ABNORMAL HIGH
WBC: 9.4

## 2010-12-27 LAB — BASIC METABOLIC PANEL
Calcium: 8.7
Creatinine, Ser: 0.82
GFR calc Af Amer: >60

## 2010-12-27 LAB — DIFFERENTIAL
Basophils Relative: 0
Basophils Relative: 0
Lymphs Abs: 1
Lymphs Abs: 1.5
Monocytes Absolute: 0.6
Monocytes Relative: 6
Monocytes Relative: 7
Neutro Abs: 7.6
Neutro Abs: 8.7 — ABNORMAL HIGH
Neutrophils Relative %: 79 — ABNORMAL HIGH

## 2011-07-16 ENCOUNTER — Encounter: Payer: Self-pay | Admitting: Internal Medicine

## 2011-09-07 ENCOUNTER — Encounter: Payer: Self-pay | Admitting: Internal Medicine

## 2011-10-04 ENCOUNTER — Other Ambulatory Visit: Payer: Self-pay | Admitting: *Deleted

## 2011-10-04 ENCOUNTER — Ambulatory Visit (INDEPENDENT_AMBULATORY_CARE_PROVIDER_SITE_OTHER): Payer: Medicare Other | Admitting: Internal Medicine

## 2011-10-04 ENCOUNTER — Encounter: Payer: Self-pay | Admitting: Internal Medicine

## 2011-10-04 VITALS — BP 124/80 | HR 60 | Temp 98.6°F | Resp 16 | Wt 236.0 lb

## 2011-10-04 DIAGNOSIS — N951 Menopausal and female climacteric states: Secondary | ICD-10-CM

## 2011-10-04 DIAGNOSIS — R899 Unspecified abnormal finding in specimens from other organs, systems and tissues: Secondary | ICD-10-CM

## 2011-10-04 DIAGNOSIS — M899 Disorder of bone, unspecified: Secondary | ICD-10-CM

## 2011-10-04 DIAGNOSIS — Z8659 Personal history of other mental and behavioral disorders: Secondary | ICD-10-CM

## 2011-10-04 DIAGNOSIS — E785 Hyperlipidemia, unspecified: Secondary | ICD-10-CM

## 2011-10-04 DIAGNOSIS — I10 Essential (primary) hypertension: Secondary | ICD-10-CM

## 2011-10-04 DIAGNOSIS — R6889 Other general symptoms and signs: Secondary | ICD-10-CM

## 2011-10-04 MED ORDER — SIMVASTATIN 10 MG PO TABS: 10.0000 mg | ORAL_TABLET | Freq: Every day | ORAL | Status: DC

## 2011-10-04 MED ORDER — CLORAZEPATE DIPOTASSIUM 7.5 MG PO TABS: 7.5000 mg | ORAL_TABLET | Freq: Four times a day (QID) | ORAL | Status: DC

## 2011-10-04 MED ORDER — FOSINOPRIL SODIUM-HCTZ 20-12.5 MG PO TABS: 1.0000 | ORAL_TABLET | Freq: Every day | ORAL | Status: DC

## 2011-10-04 MED ORDER — SOLIFENACIN SUCCINATE 5 MG PO TABS: 5.0000 mg | ORAL_TABLET | Freq: Every day | ORAL | Status: DC

## 2011-10-04 MED ORDER — BISOPROLOL FUMARATE 10 MG PO TABS: 10.0000 mg | ORAL_TABLET | Freq: Every day | ORAL | Status: DC

## 2011-10-04 NOTE — Assessment & Plan Note (Addendum)
BP Readings from Last 3 Encounters:  10/04/11 124/80  11/07/10 110/72  05/25/10 132/82   Good control on present medications. Declines labs today. Bmet on file to drawn at her convenience

## 2011-10-04 NOTE — Assessment & Plan Note (Signed)
Lab Results  Component Value Date   CHOL 173 05/25/2010   HDL 43.50 05/25/2010   LDLDIRECT 99.7 05/25/2010   TRIG 302.0* 05/25/2010   CHOLHDL 4 05/25/2010   Tolerating Zocor well. Declines lab today - order on file for later draw at her convenience.  Continue present medications.

## 2011-10-04 NOTE — Assessment & Plan Note (Signed)
Had DXA July 17th - recommendations will be based on results

## 2011-10-04 NOTE — Progress Notes (Signed)
Subjective:    Patient ID: Stephanie Pham, female    DOB: [birth year], 61 y.o.   MRN: [id number]  HPI Stephanie Pham presents for medication refills. She has had a lot of stress: her mother-in-law died recently; her mother died unexpectedly 2022/08/11 - cardiac arrest/acute respiratory failure. 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 Her health has been OK. She had mammogram and DXA July 17th. She is scheduled for f/u colonoscopy.   Past Medical History  Diagnosis Date  . Chronic depression   . Dyslipidemia   . Internal hemorrhoid   . Diverticulosis of colon   . Hypertension   . Chronic anxiety    Past Surgical History  Procedure Date  . Laparotomy and removal of polyp   . Cholecystectomy   . Sigmoid colectomy   . Abdominal hysterectomy    Family History  Problem Relation Age of Onset  . Fibromyalgia Mother   . Hypertension Mother   . Heart disease Father   . Diabetes Father   . Cancer Maternal Aunt     Breast cancer   History   Social History  . Marital Status: Married    Spouse Name: N/A    Number of Children: N/A  . Years of Education: N/A   Occupational History  . Not on file.   Social History Main Topics  . Smoking status: Never Smoker   . Smokeless tobacco: Not on file  . Alcohol Use: Not on file  . Drug Use: Not on file  . Sexually Active: Not on file   Other Topics Concern  . 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 aspirin 325 MG tablet Take 325 mg by mouth daily.        . bisoprolol (ZEBETA) 10 MG tablet Take 1 tablet (10 mg total) by mouth daily.  90 tablet  3  . clorazepate (TRANXENE) 7.5 MG tablet Take 1 tablet (7.5 mg total) by mouth 4 (four) times daily.  360 tablet  1  . fosinopril-hydrochlorothiazide (MONOPRIL-HCT) 20-12.5 MG per tablet Take 1 tablet by  mouth daily.  90 tablet  3  . simvastatin (ZOCOR) 10 MG tablet Take 1 tablet (10 mg total) by mouth at bedtime.  90 tablet  3  . solifenacin (VESICARE) 5 MG tablet Take 1 tablet (5 mg total) by mouth daily.  30 tablet  11      Review of Systems System review is negative for any constitutional, cardiac, pulmonary, GI or neuro symptoms or complaints other than as described in the HPI.     Objective:   Physical Exam Filed Vitals:   10/04/11 1057  BP: 124/80  Pulse: 60  Temp: 98.6 F (37 C)  Resp: 16   Wt Readings from Last 3 Encounters:  10/04/11 236 lb (107.049 kg)  11/07/10 232 lb (105.235 kg)  05/25/10 231 lb (104.781 kg)   Gen'l - obese white woman in no distress HEENT- C&S clear, PERRLA Cor 2+ radial pulse, RRR Pulm - normal respirations. Neuro - A&O x 3, normal cognition, normal gait.   Lab Results  Component Value Date   WBC 5.1 05/25/2010   HGB 13.5 05/25/2010   HCT 38.8 05/25/2010   PLT 265.0 05/25/2010   GLUCOSE 99 05/25/2010   CHOL 173 05/25/2010   TRIG 302.0*  05/25/2010   HDL 43.50 05/25/2010   LDLDIRECT 99.7 05/25/2010   ALT 67* 05/25/2010   AST 57* 05/25/2010   NA 141 05/25/2010   K 3.7 05/25/2010   CL 103 05/25/2010   CREATININE 0.9 05/25/2010   BUN 10 05/25/2010   CO2 29 05/25/2010         Assessment & Plan:

## 2011-10-04 NOTE — Assessment & Plan Note (Signed)
Continues on tranxene. She has increased stress (see HPI) but seems to be doing well.

## 2011-10-04 NOTE — Telephone Encounter (Signed)
Pt needs rx for Tran[facility]ene to [facility] [address]. Okay to refill-please advise.

## 2011-10-05 ENCOUNTER — Other Ambulatory Visit: Payer: Self-pay

## 2011-10-05 ENCOUNTER — Other Ambulatory Visit: Payer: Self-pay | Admitting: *Deleted

## 2011-10-05 MED ORDER — CLORAZEPATE DIPOTASSIUM 7.5 MG PO TABS: 7.5000 mg | ORAL_TABLET | Freq: Four times a day (QID) | ORAL | Status: DC

## 2011-10-05 NOTE — Telephone Encounter (Signed)
Personally called in Rx

## 2011-10-10 ENCOUNTER — Telehealth: Payer: Self-pay | Admitting: *Deleted

## 2011-10-10 ENCOUNTER — Encounter: Payer: Self-pay | Admitting: Internal Medicine

## 2011-10-10 ENCOUNTER — Ambulatory Visit (AMBULATORY_SURGERY_CENTER): Payer: Medicare Other | Admitting: *Deleted

## 2011-10-10 VITALS — Ht 63.0 in | Wt 236.0 lb

## 2011-10-10 DIAGNOSIS — Z1211 Encounter for screening for malignant neoplasm of colon: Secondary | ICD-10-CM

## 2011-10-10 MED ORDER — MOVIPREP 100 G PO SOLR: ORAL | Status: DC

## 2011-10-10 NOTE — Telephone Encounter (Signed)
[facility]  LEFT MESSAGE ON PATIENT MOBILE VOICE MAIL OF THIS

## 2011-10-24 ENCOUNTER — Encounter: Payer: 59 | Admitting: Internal Medicine

## 2011-10-29 ENCOUNTER — Encounter: Payer: Self-pay | Admitting: Internal Medicine

## 2011-10-30 ENCOUNTER — Encounter: Payer: Self-pay | Admitting: Internal Medicine

## 2011-11-06 ENCOUNTER — Ambulatory Visit (AMBULATORY_SURGERY_CENTER): Payer: Medicare Other | Admitting: Internal Medicine

## 2011-11-06 ENCOUNTER — Encounter: Payer: Self-pay | Admitting: Internal Medicine

## 2011-11-06 VITALS — BP 125/93 | HR 65 | Temp 97.3°F | Resp 14 | Ht 63.0 in | Wt 236.0 lb

## 2011-11-06 DIAGNOSIS — Z85038 Personal history of other malignant neoplasm of large intestine: Secondary | ICD-10-CM

## 2011-11-06 DIAGNOSIS — D126 Benign neoplasm of colon, unspecified: Secondary | ICD-10-CM

## 2011-11-06 DIAGNOSIS — Z860100 Personal history of colon polyps, unspecified: Secondary | ICD-10-CM

## 2011-11-06 DIAGNOSIS — Z1211 Encounter for screening for malignant neoplasm of colon: Secondary | ICD-10-CM

## 2011-11-06 LAB — HM COLONOSCOPY

## 2011-11-06 MED ORDER — SODIUM CHLORIDE 0.9 % IV SOLN: 500.0000 mL | INTRAVENOUS | Status: DC

## 2011-11-06 NOTE — Progress Notes (Signed)
Patient did not experience any of the following events: a burn prior to discharge; a fall within the facility; wrong site/side/patient/procedure/implant event; or a hospital transfer or hospital admission upon discharge from the facility. (G8907) Patient did not have preoperative order for IV antibiotic SSI prophylaxis. (G8918)  

## 2011-11-06 NOTE — Op Note (Addendum)
[facility] [address] [place], [address]   COLONOSCOPY PROCEDURE REPORT  PATIENT: Stephanie Pham, Stephanie Pham  MR#: [id number] BIRTHDATE: Jun 18, 1950 , 60  yrs. old GENDER: Female ENDOSCOPIST: Iva Boop, MD, [facility] REFERRED BY: PROCEDURE DATE:  11/06/2011 PROCEDURE:   Colonoscopy with biopsy ASA CLASS:   Class II INDICATIONS:high risk patient with personal history of colonic polyps and high risk patient with personal history of colon cancer. resection 1976 MEDICATIONS: MAC sedation, administered by CRNA, These medications were titrated to patient response per physician's verbal order, propofol (Diprivan) 150mg  IV  DESCRIPTION OF PROCEDURE:   After the risks benefits and alternatives of the procedure were thoroughly explained, informed consent was obtained.  A digital rectal exam revealed no abnormalities of the rectum.   The LB CF-[id number]  endoscope was introduced through the anus  and advanced to the cecum, which was identified by both the appendix and ileocecal valve , limited by No adverse events experienced.   The quality of the prep was excellent, using MoviPrep . The instrument was then slowly withdrawn as the colon was fully examined.      COLON FINDINGS: There was evidence of a prior surgical anastomosis. In left colon.  She is s/p segmental resection in 1976 (cancer?)and 2008 (and benign stricture)A diminutive polyp was found in the transverse colon.  A biopsy was performed using cold forceps. Removing the polyp  The colon mucosa was otherwise normal. Retroflexed views revealed no abnormalities. Rectum and right colon. The time to cecum = 0.55 minutes . The scope was then withdrawn in 6:09 minutes   from the cecum and the procedure completed. COMPLICATIONS: There were no complications. ENDOSCOPIC IMPRESSION: 1.   There was evidence of a prior surgical anastomosis 2.   Diminutive polyp was found in the transverse colon; biopsy was performed  using cold forceps polyp removed 3.   The colon mucosa was otherwise normal Excellent prep. 4.   Personal history of colon polyps - 2 adenomas 2008 and possible colorectal cancer 1976  RECOMMENDATIONS: 1.  Await biopsy results 2.   will notify.  eSigned:  Iva Boop, MD, [facility] 11/06/2011 8:33 AM   cc:The Patient   PATIENT NAME:  Stephanie Pham, Stephanie Pham MR#: [id number]

## 2011-11-06 NOTE — Patient Instructions (Addendum)
One tiny polyp was removed. I will let you know the results. Expect next colonoscopy in about 5 years.  Thank you for choosing me and [facility].  Iva Boop, MD, FACG YOU HAD AN ENDOSCOPIC PROCEDURE TODAY AT THE  ENDOSCOPY CENTER: Refer to the procedure report that was given to you for any specific questions about what was found during the examination.  If the procedure report does not answer your questions, please call your gastroenterologist to clarify.  If you requested that your care partner not be given the details of your procedure findings, then the procedure report has been included in a sealed envelope for you to review at your convenience later.  YOU SHOULD EXPECT: Some feelings of bloating in the abdomen. Passage of more gas than usual.  Walking can help get rid of the air that was put into your GI tract during the procedure and reduce the bloating. If you had a lower endoscopy (such as a colonoscopy or flexible sigmoidoscopy) you may notice spotting of blood in your stool or on the toilet paper. If you underwent a bowel prep for your procedure, then you may not have a normal bowel movement for a few days.  DIET: Your first meal following the procedure should be a light meal and then it is ok to progress to your normal diet.  A half-sandwich or bowl of soup is an example of a good first meal.  Heavy or fried foods are harder to digest and may make you feel nauseous or bloated.  Likewise meals heavy in dairy and vegetables can cause extra gas to form and this can also increase the bloating.  Drink plenty of fluids but you should avoid alcoholic beverages for 24 hours.  ACTIVITY: Your care partner should take you home directly after the procedure.  You should plan to take it easy, moving slowly for the rest of the day.  You can resume normal activity the day after the procedure however you should NOT DRIVE or use heavy machinery for 24 hours (because of the sedation  medicines used during the test).    SYMPTOMS TO REPORT IMMEDIATELY: A gastroenterologist can be reached at any hour.  During normal business hours, 8:30 AM to 5:00 PM Monday through Friday, call [phone].  After hours and on weekends, please call the GI answering service at [phone] who will take a message and have the physician on call contact you.   Following lower endoscopy (colonoscopy or flexible sigmoidoscopy):  Excessive amounts of blood in the stool  Significant tenderness or worsening of abdominal pains  Swelling of the abdomen that is new, acute  Fever of 100F or higher  Following upper endoscopy (EGD)  Vomiting of blood or coffee ground material  New chest pain or pain under the shoulder blades  Painful or persistently difficult swallowing  New shortness of breath  Fever of 100F or higher  Black, tarry-looking stools  FOLLOW UP: If any biopsies were taken you will be contacted by phone or by letter within the next 1-3 weeks.  Call your gastroenterologist if you have not heard about the biopsies in 3 weeks.  Our staff will call the home number listed on your records the next business day following your procedure to check on you and address any questions or concerns that you may have at that time regarding the information given to you following your procedure. This is a courtesy call and so if there is no answer at the home  number and we have not heard from you through the emergency physician on call, we will assume that you have returned to your regular daily activities without incident.  SIGNATURES/CONFIDENTIALITY: You and/or your care partner have signed paperwork which will be entered into your electronic medical record.  These signatures attest to the fact that that the information above on your After Visit Summary has been reviewed and is understood.  Full responsibility of the confidentiality of this discharge information lies with you and/or your care-partner.

## 2011-11-07 ENCOUNTER — Telehealth: Payer: Self-pay | Admitting: *Deleted

## 2011-11-07 NOTE — Telephone Encounter (Signed)
  Follow up Call-  Call back number 11/06/2011  Post procedure Call Back phone  # [phone]  Permission to leave phone message Yes     Patient questions:  Do you have a fever, pain , or abdominal swelling? no Pain Score  0 *  Have you tolerated food without any problems? yes  Have you been able to return to your normal activities? yes  Do you have any questions about your discharge instructions: Diet   no Medications  no Follow up visit  no  Do you have questions or concerns about your Care? no  Actions: * If pain score is 4 or above: No action needed, pain <4.

## 2011-11-14 ENCOUNTER — Encounter: Payer: Self-pay | Admitting: Internal Medicine

## 2011-11-14 NOTE — Progress Notes (Signed)
Quick Note:  Diminutive adenoma Repeat colonoscopy about 11/2016 ______

## 2011-12-07 ENCOUNTER — Other Ambulatory Visit: Payer: Self-pay | Admitting: *Deleted

## 2011-12-07 MED ORDER — CLORAZEPATE DIPOTASSIUM 7.5 MG PO TABS: 7.5000 mg | ORAL_TABLET | Freq: Four times a day (QID) | ORAL | Status: DC

## 2012-01-10 ENCOUNTER — Telehealth: Payer: Self-pay | Admitting: Internal Medicine

## 2012-01-10 NOTE — Telephone Encounter (Signed)
[facility] in [place], [place] informed.

## 2012-01-10 NOTE — Telephone Encounter (Signed)
Caller: Christina/Pharmacist [facility]; Patient Name: Stephanie Pham; PCP: Sonda Primes (Adults only); Best Callback Phone Number: [phone] Pharmacist Trula Ore  is calling because pt had a rx for Tranxene faxed to local pharmacy in [place] that has now been faxed  to them in [place], [place] to fill.  They want to verify rx because it is a transfer across state lines and it  was written in 11/2011.  Per EPIC rx was written 12/07/11, message sent to make sure provider has no concerns with her getting rx filled  out of town.

## 2012-01-10 NOTE — Telephone Encounter (Signed)
Please advise on behalf of Dr.Plotnikov 

## 2012-01-10 NOTE — Telephone Encounter (Signed)
pls ask Dr Debby Bud if ok Thx

## 2012-01-10 NOTE — Telephone Encounter (Signed)
Yes, we did write this prescription and it is ok to fill.

## 2012-06-29 ENCOUNTER — Other Ambulatory Visit: Payer: Self-pay | Admitting: Internal Medicine

## 2012-06-30 NOTE — Telephone Encounter (Signed)
Clorazepate called to pharmacy

## 2012-08-21 ENCOUNTER — Other Ambulatory Visit: Payer: Self-pay | Admitting: Internal Medicine

## 2012-08-27 ENCOUNTER — Ambulatory Visit (INDEPENDENT_AMBULATORY_CARE_PROVIDER_SITE_OTHER): Payer: Medicare Other

## 2012-08-27 DIAGNOSIS — N951 Menopausal and female climacteric states: Secondary | ICD-10-CM

## 2012-08-27 DIAGNOSIS — E785 Hyperlipidemia, unspecified: Secondary | ICD-10-CM

## 2012-08-27 DIAGNOSIS — R899 Unspecified abnormal finding in specimens from other organs, systems and tissues: Secondary | ICD-10-CM

## 2012-08-27 DIAGNOSIS — I10 Essential (primary) hypertension: Secondary | ICD-10-CM

## 2012-08-27 DIAGNOSIS — R6889 Other general symptoms and signs: Secondary | ICD-10-CM

## 2012-08-27 LAB — COMPREHENSIVE METABOLIC PANEL
ALT: 57 U/L — ABNORMAL HIGH (ref 0–35)
AST: 60 U/L — ABNORMAL HIGH (ref 0–37)
CO2: 26 mEq/L (ref 19–32)
Chloride: 106 mEq/L (ref 96–112)
GFR: 74.91 mL/min (ref >60.00)
Sodium: 141 mEq/L (ref 135–145)
Total Bilirubin: 0.6 mg/dL (ref 0.3–1.2)
Total Protein: 7.7 g/dL (ref 6.0–8.3)

## 2012-08-27 LAB — LIPID PANEL
HDL: 43.8 mg/dL (ref >39.00)
Total CHOL/HDL Ratio: 4
Triglycerides: 242 mg/dL — ABNORMAL HIGH (ref 0.0–149.0)
VLDL: 48.4 mg/dL — ABNORMAL HIGH (ref 0.0–40.0)

## 2012-08-27 LAB — HEPATIC FUNCTION PANEL
AST: 60 U/L — ABNORMAL HIGH (ref 0–37)
Albumin: 3.8 g/dL (ref 3.5–5.2)
Alkaline Phosphatase: 66 U/L (ref 39–117)
Total Protein: 7.7 g/dL (ref 6.0–8.3)

## 2012-08-27 LAB — LDL CHOLESTEROL, DIRECT: Direct LDL: 100.6 mg/dL

## 2012-08-27 LAB — HEMOGLOBIN A1C: Hgb A1c MFr Bld: 5.8 % (ref 4.6–6.5)

## 2012-08-29 ENCOUNTER — Encounter: Payer: Self-pay | Admitting: Internal Medicine

## 2012-10-27 ENCOUNTER — Other Ambulatory Visit: Payer: Self-pay

## 2012-10-27 MED ORDER — FOSINOPRIL SODIUM-HCTZ 20-12.5 MG PO TABS: 1.0000 | ORAL_TABLET | Freq: Every day | ORAL | Status: DC

## 2012-10-27 MED ORDER — BISOPROLOL FUMARATE 10 MG PO TABS: 10.0000 mg | ORAL_TABLET | Freq: Every day | ORAL | Status: DC

## 2012-10-27 MED ORDER — SIMVASTATIN 10 MG PO TABS: 10.0000 mg | ORAL_TABLET | Freq: Every day | ORAL | Status: DC

## 2012-11-14 ENCOUNTER — Encounter: Payer: Self-pay | Admitting: Internal Medicine

## 2012-11-20 ENCOUNTER — Other Ambulatory Visit: Payer: Self-pay | Admitting: Internal Medicine

## 2012-11-20 NOTE — Telephone Encounter (Signed)
Clorazepate called to pharmacy

## 2013-02-09 ENCOUNTER — Encounter: Payer: Self-pay | Admitting: Internal Medicine

## 2013-02-09 ENCOUNTER — Ambulatory Visit (INDEPENDENT_AMBULATORY_CARE_PROVIDER_SITE_OTHER): Payer: Medicare Other | Admitting: Internal Medicine

## 2013-02-09 VITALS — BP 136/92 | HR 64 | Temp 97.5°F | Ht 64.0 in | Wt 243.0 lb

## 2013-02-09 DIAGNOSIS — Z0000 Encounter for general adult medical examination without abnormal findings: Secondary | ICD-10-CM | POA: Insufficient documentation

## 2013-02-09 DIAGNOSIS — I10 Essential (primary) hypertension: Secondary | ICD-10-CM

## 2013-02-09 DIAGNOSIS — Z8659 Personal history of other mental and behavioral disorders: Secondary | ICD-10-CM

## 2013-02-09 DIAGNOSIS — E785 Hyperlipidemia, unspecified: Secondary | ICD-10-CM

## 2013-02-09 DIAGNOSIS — Z23 Encounter for immunization: Secondary | ICD-10-CM

## 2013-02-09 MED ORDER — ATENOLOL 50 MG PO TABS: 50.0000 mg | ORAL_TABLET | Freq: Every day | ORAL | Status: DC

## 2013-02-09 MED ORDER — CLORAZEPATE DIPOTASSIUM 7.5 MG PO TABS: ORAL_TABLET | ORAL | Status: DC

## 2013-02-09 NOTE — Assessment & Plan Note (Signed)
BP Readings from Last 3 Encounters:  02/09/13 136/92  11/06/11 125/93  10/04/11 124/80   Good enough control. Due to coverage will change from Bystolic 10 mg to atenolol 50 mg

## 2013-02-09 NOTE — Assessment & Plan Note (Signed)
In good spirits and doing well.

## 2013-02-09 NOTE — Assessment & Plan Note (Signed)
Discussed with patient - she is aware that she is obese and that this is a health risk.  Plan Diet management: smart food choices, PORTION SIZE CONTROL, regular exercise. Goal - to loose 1-2 lbs.month. [employer] weight - 175 lbs ( 5 year program)

## 2013-02-09 NOTE — Assessment & Plan Note (Signed)
Interval medical history is unremarkable. Physical exam, sans breast and pelvic, is notable for obesity but otherwise normal. Labs reviewed an OK. She is current with colorectal and breast cancer screening. Immunizations - Tetanus today, she will check on coverage for shingles, due for Pneumonia vaccines at 64. She is advised to cut back on starch and to develop a regular exercise program.  In summary A nice woman who is medically stable with her biggest health risk being her weight.

## 2013-02-09 NOTE — Progress Notes (Signed)
Subjective:    Patient ID: Stephanie Pham, female    DOB: 04-18-1950, 62 y.o.   MRN: [id number]  HPI The patient is here for annual Medicare wellness examination and management of other chronic and acute problems.  Mr. Marner in the interval has had normal gyn exam, had mammogram and has seen her ophthalmologist and she is due for bilateral cataract surgery. She has been to her dentist.    The risk factors are reflected in the social history.  The roster of all physicians providing medical care to patient - is listed in the Snapshot section of the chart.  Activities of daily living:  The patient is 100% inedpendent in all ADLs: dressing, toileting, feeding as well as independent mobility  Home safety : The patient has smoke detectors in the home. Needs to change batteries in detectors. They wear seatbelts. No firearms at home. There is no violence in the home.   There is no risks for hepatitis, STDs or HIV. There is no history of blood transfusion. They have no travel history to infectious disease endemic areas of the world.  The patient has seen their dentist in the last six month. They have seen their eye doctor in the last year. They deny any hearing difficulty and have not had audiologic testing in the last year.    They do not  have excessive sun exposure. Discussed the need for sun protection: hats, long sleeves and use of sunscreen if there is significant sun exposure.   Diet: the importance of a healthy diet is discussed. They do have a unhealthy-high fat/fast food diet. Too much starch in the diet.   The patient has no regular exercise program..  The benefits of regular aerobic exercise were discussed.  Depression screen: there are no signs or vegative symptoms of depression- irritability, change in appetite, anhedonia, sadness/tearfullness.  Cognitive assessment: the patient manages all their financial and personal affairs and is actively engaged.   The following portions  of the patient's history were reviewed and updated as appropriate: allergies, current medications, past family history, past medical history,  past surgical history, past social history  and problem list.  Vision, hearing, body mass index were assessed and reviewed.   During the course of the visit the patient was educated and counseled about appropriate screening and preventive services including : fall prevention , diabetes screening, nutrition counseling, colorectal cancer screening, and recommended immunizations.  Past Medical History  Diagnosis Date  . Chronic depression   . Dyslipidemia   . Internal hemorrhoid   . Diverticulosis of colon   . Hypertension   . Chronic anxiety    Past Surgical History  Procedure Laterality Date  . Laparotomy and removal of polyp    . Cholecystectomy    . Sigmoid colectomy    . Abdominal hysterectomy     Family History  Problem Relation Age of Onset  . Fibromyalgia Mother   . Hypertension Mother   . Heart disease Father   . Diabetes Father   . Cancer Maternal Aunt     Breast cancer  . Colon cancer Neg Hx   . Rectal cancer Neg Hx   . Stomach cancer Neg Hx   . Esophageal cancer Neg Hx    History   Social History  . Marital Status: Married    Spouse Name: N/A    Number of Children: N/A  . Years of Education: N/A   Occupational History  . Not on file.   Social History  Main Topics  . Smoking status: Never Smoker   . Smokeless tobacco: Never Used  . Alcohol Use: No  . Drug Use: No  . Sexual Activity: Not on file   Other Topics Concern  . Not on file   Social History Narrative   Married 1968   2 sons, 1 daughter lost one son in MVA   Full-time homemaker    Current Outpatient Prescriptions on File Prior to Visit  Medication Sig Dispense Refill  . acetaminophen (TYLENOL) 325 MG tablet Take 650 mg by mouth every 6 (six) hours as needed.      . bisoprolol (ZEBETA) 10 MG tablet Take 1 tablet (10 mg total) by mouth daily.  90  tablet  3  . clorazepate (TRANXENE) 7.5 MG tablet TAKE 1 TABLET BY MOUTH 4 TIMES A DAY  360 tablet  0  . fosinopril-hydrochlorothiazide (MONOPRIL-HCT) 20-12.5 MG per tablet Take 1 tablet by mouth daily.  90 tablet  3  . simvastatin (ZOCOR) 10 MG tablet Take 1 tablet (10 mg total) by mouth at bedtime.  90 tablet  3   No current facility-administered medications on file prior to visit.     Review of Systems Constitutional:  Negative for fever, chills, activity change and unexpected weight change.  HEENT:  Negative for hearing loss, ear pain, congestion, neck stiffness and postnasal drip. Negative for sore throat or swallowing problems. Negative for dental complaints.   Eyes: Negative for vision loss or change in visual acuity.  Respiratory: Negative for chest tightness and wheezing. Negative for DOE.   Cardiovascular: Negative for chest pain or palpitations. No decreased exercise tolerance Gastrointestinal: No change in bowel habit. No bloating or gas. No reflux or indigestion Genitourinary: Negative for urgency, frequency, flank pain and difficulty urinating.  Musculoskeletal: Negative for myalgias, back pain, arthralgias and gait problem.  Neurological: Negative for dizziness, tremors, weakness and headaches.  Hematological: Negative for adenopathy.  Psychiatric/Behavioral: Negative for behavioral problems and dysphoric mood.       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 C&S clear, PERRLA, fundi - normal Neck - supple, no  thyromegaly Nodes- negative submental, cervical, supraclavicular regions Chest - no deformity, no CVAT Lungs - clear without rales, wheezes. No increased work of breathing Breast - deferred to mammography Cardiovascular - regular rate and rhythm, quiet precordium, no murmurs, rubs or gallops, 2+ radial, DP and PT pulses Abdomen - BS+ x 4, no HSM, no guarding or rebound or tenderness Pelvic - deferred to gyn Rectal - deferred to gyn Extremities - no clubbing, cyanosis, edema or deformity.  Neuro - A&O x 3, CN II-XII normal, motor strength normal and equal, DTRs 2+ and symmetrical biceps, radial, and patellar tendons. Cerebellar - no tremor, no rigidity, fluid movement and normal gait. Derm - Head, neck, back, abdomen and extremities without suspicious lesions  Reviewed labs from June '14 - in normal range with LDL 100.6 HDL 43.8, Tgy 242       Assessment & Plan:

## 2013-02-09 NOTE — Patient Instructions (Signed)
Thanks for coming in. Congratulations on the great-grandbaby  Your exam is ok except for the weight.  Your labs were good but watch the fats and really watch the starch  Immunizations : Flu and tetanus today. Please check on coverage for shingles vaccine.  You are current with health maintenance.  Please sign up for MyChart.

## 2013-02-09 NOTE — Progress Notes (Signed)
Pre visit review using our clinic review tool, if applicable. No additional management support is needed unless otherwise documented below in the visit note. 

## 2013-02-09 NOTE — Assessment & Plan Note (Signed)
Last lab in June with good control except minor elevation in Tgy.  Plan Continue present meds.

## 2013-05-27 ENCOUNTER — Encounter: Payer: Self-pay | Admitting: Internal Medicine

## 2013-05-27 ENCOUNTER — Ambulatory Visit (INDEPENDENT_AMBULATORY_CARE_PROVIDER_SITE_OTHER): Payer: Medicare Other | Admitting: Internal Medicine

## 2013-05-27 VITALS — BP 120/90 | HR 70 | Temp 97.8°F | Resp 16 | Ht 64.0 in | Wt 241.5 lb

## 2013-05-27 DIAGNOSIS — M25571 Pain in right ankle and joints of right foot: Secondary | ICD-10-CM

## 2013-05-27 DIAGNOSIS — M25579 Pain in unspecified ankle and joints of unspecified foot: Secondary | ICD-10-CM

## 2013-05-27 DIAGNOSIS — R252 Cramp and spasm: Secondary | ICD-10-CM

## 2013-05-27 MED ORDER — UNABLE TO FIND: Status: DC

## 2013-05-27 NOTE — Patient Instructions (Signed)
Heel pain is likely minor achilles tendonitis. Plan Ankle stretch exercises  Topical rub, e.g. aspercreme  Hand cramping - normal exam, i.e. No sign of carpal tunnel or any neuropathy. Plan  use a roller ball instead of a mouse    New PCP - Dr. Dorita Sciara.

## 2013-05-27 NOTE — Progress Notes (Signed)
Subjective:    Patient ID: Stephanie Pham, female    DOB: [birth year], 63 y.o.   MRN: [id number]  HPI Ms. Neiss presents for minor ankle pain left - at the attachment of the achilles. She has also had right hand cramping and tremor with intentions. She does have a h/o essential tremor.  She wants to know who to see.   Past Medical History  Diagnosis Date  . Chronic depression   . Dyslipidemia   . Internal hemorrhoid   . Diverticulosis of colon   . Hypertension   . Chronic anxiety    Past Surgical History  Procedure Laterality Date  . Laparotomy and removal of polyp    . Cholecystectomy    . Sigmoid colectomy    . Abdominal hysterectomy    . Cataract extraction      left eye 05-13-2013   Family History  Problem Relation Age of Onset  . Fibromyalgia Mother   . Hypertension Mother   . Heart disease Father   . Diabetes Father   . Cancer Maternal Aunt     Breast cancer  . Colon cancer Neg Hx   . Rectal cancer Neg Hx   . Stomach cancer Neg Hx   . Esophageal cancer Neg Hx    History   Social History  . Marital Status: Married    Spouse Name: N/A    Number of Children: N/A  . Years of Education: N/A   Occupational History  . Not on file.   Social History Main Topics  . Smoking status: Never Smoker   . Smokeless tobacco: Never Used  . Alcohol Use: No  . Drug Use: No  . Sexual Activity: Not on file   Other Topics Concern  . Not on file   Social History Narrative   Married 1968   2 sons, 1 daughter lost one son in [place]   Full-time homemaker    Current Outpatient Prescriptions on File Prior to Visit  Medication Sig Dispense Refill  . acetaminophen (TYLENOL) 325 MG tablet Take 650 mg by mouth every 6 (six) hours as needed.      Marland Kitchen atenolol (TENORMIN) 50 MG tablet Take 1 tablet (50 mg total) by mouth daily.  90 tablet  3  . clorazepate (TRANXENE) 7.5 MG tablet TAKE 1 TABLET BY MOUTH 4 TIMES A DAY  360 tablet  3  . fosinopril-hydrochlorothiazide  (MONOPRIL-HCT) 20-12.5 MG per tablet Take 1 tablet by mouth daily.  90 tablet  3  . simvastatin (ZOCOR) 10 MG tablet Take 1 tablet (10 mg total) by mouth at bedtime.  90 tablet  3   No current facility-administered medications on file prior to visit.      Review of Systems System review is negative for any constitutional, cardiac, pulmonary, GI or neuro symptoms or complaints other than as described in the HPI.     Objective:   Physical Exam Filed Vitals:   05/27/13 0917  BP: 120/90  Pulse: 70  Temp: 97.8 F (36.6 C)  Resp: 16   Wt Readings from Last 3 Encounters:  05/27/13 241 lb 8 oz (109.544 kg)  02/09/13 243 lb (110.224 kg)  11/06/11 236 lb (107.049 kg)   Gen'l- obese woman in no acute distress HEENT - normal Cor - RRR Pulm - normal respirations\ MSK - tender at the calcaneal insertion of the achilles right ankle       Assessment & Plan:  Heel pain is likely minor achilles tendonitis. Plan Ankle  stretch exercises  Topical rub, e.g. aspercreme  Hand cramping - normal exam, i.e. No sign of carpal tunnel or any neuropathy. Plan  use a roller ball instead of a mouse

## 2013-05-27 NOTE — Progress Notes (Signed)
Pre visit review using our clinic review tool, if applicable. No additional management support is needed unless otherwise documented below in the visit note. 

## 2013-07-09 ENCOUNTER — Other Ambulatory Visit: Payer: Self-pay

## 2013-07-09 MED ORDER — FOSINOPRIL SODIUM-HCTZ 20-12.5 MG PO TABS: 1.0000 | ORAL_TABLET | Freq: Every day | ORAL | Status: DC

## 2013-07-09 MED ORDER — SIMVASTATIN 10 MG PO TABS: 10.0000 mg | ORAL_TABLET | Freq: Every day | ORAL | Status: DC

## 2013-07-16 ENCOUNTER — Telehealth: Payer: Self-pay | Admitting: Internal Medicine

## 2013-07-16 NOTE — Telephone Encounter (Signed)
Patient states [facility] has tried to refill clorazepate with Dr. Ronnald Ramp but has not been able to contact.  Patient is almost out of meds.  Please contact patient in regards.

## 2013-07-17 NOTE — Telephone Encounter (Signed)
Spoke with Stephanie Pham and informed her she needed an appt to refill her script. She was transferred to [facility] to sched that appt.

## 2013-07-22 ENCOUNTER — Encounter: Payer: Self-pay | Admitting: Internal Medicine

## 2013-07-22 ENCOUNTER — Other Ambulatory Visit (INDEPENDENT_AMBULATORY_CARE_PROVIDER_SITE_OTHER): Payer: Medicare Other

## 2013-07-22 ENCOUNTER — Ambulatory Visit (INDEPENDENT_AMBULATORY_CARE_PROVIDER_SITE_OTHER): Payer: Medicare Other | Admitting: Internal Medicine

## 2013-07-22 VITALS — BP 122/80 | HR 65 | Temp 97.4°F | Resp 16 | Ht 64.0 in | Wt 238.0 lb

## 2013-07-22 DIAGNOSIS — R7309 Other abnormal glucose: Secondary | ICD-10-CM

## 2013-07-22 DIAGNOSIS — R739 Hyperglycemia, unspecified: Secondary | ICD-10-CM | POA: Insufficient documentation

## 2013-07-22 DIAGNOSIS — F411 Generalized anxiety disorder: Secondary | ICD-10-CM

## 2013-07-22 DIAGNOSIS — K760 Fatty (change of) liver, not elsewhere classified: Secondary | ICD-10-CM | POA: Insufficient documentation

## 2013-07-22 DIAGNOSIS — K7689 Other specified diseases of liver: Secondary | ICD-10-CM

## 2013-07-22 DIAGNOSIS — I10 Essential (primary) hypertension: Secondary | ICD-10-CM

## 2013-07-22 DIAGNOSIS — R251 Tremor, unspecified: Secondary | ICD-10-CM

## 2013-07-22 DIAGNOSIS — R259 Unspecified abnormal involuntary movements: Secondary | ICD-10-CM

## 2013-07-22 LAB — COMPREHENSIVE METABOLIC PANEL
ALT: 39 U/L — ABNORMAL HIGH (ref 0–35)
AST: 36 U/L (ref 0–37)
Albumin: 4.2 g/dL (ref 3.5–5.2)
Alkaline Phosphatase: 88 U/L (ref 39–117)
BUN: 14 mg/dL (ref 6–23)
CHLORIDE: 102 meq/L (ref 96–112)
CO2: 27 mEq/L (ref 19–32)
Calcium: 9.3 mg/dL (ref 8.4–10.5)
Creatinine, Ser: 1 mg/dL (ref 0.4–1.2)
GFR: 60.09 mL/min (ref >60.00)
Glucose, Bld: 105 mg/dL — ABNORMAL HIGH (ref 70–99)
Potassium: 3.5 mEq/L (ref 3.5–5.1)
Sodium: 138 mEq/L (ref 135–145)
Total Bilirubin: 0.5 mg/dL (ref 0.2–1.2)
Total Protein: 7.9 g/dL (ref 6.0–8.3)

## 2013-07-22 LAB — HEMOGLOBIN A1C: Hgb A1c MFr Bld: 5.8 % (ref 4.6–6.5)

## 2013-07-22 MED ORDER — CLORAZEPATE DIPOTASSIUM 7.5 MG PO TABS: ORAL_TABLET | ORAL | Status: DC

## 2013-07-22 NOTE — Progress Notes (Signed)
Subjective:    Patient ID: Stephanie Pham, female    DOB: 1950-12-07, 63 y.o.   MRN: [id number]  Hypertension This is a chronic problem. The current episode started more than 1 year ago. The problem has been gradually improving since onset. The problem is controlled. Associated symptoms include anxiety. Pertinent negatives include no blurred vision, chest pain, headaches, malaise/fatigue, neck pain, orthopnea, palpitations, peripheral edema, PND, shortness of breath or sweats. There are no associated agents to hypertension. Past treatments include beta blockers, ACE inhibitors and diuretics. The current treatment provides moderate improvement. Compliance problems include exercise and diet.       Review of Systems  Constitutional: Negative.  Negative for fever, chills, malaise/fatigue, diaphoresis, appetite change and fatigue.  HENT: Negative.   Eyes: Negative.  Negative for blurred vision.  Respiratory: Negative.  Negative for cough, choking, chest tightness, shortness of breath, wheezing and stridor.   Cardiovascular: Negative.  Negative for chest pain, palpitations, orthopnea, leg swelling and PND.  Gastrointestinal: Negative.  Negative for nausea, vomiting, abdominal pain, diarrhea, constipation and blood in stool.  Endocrine: Negative.   Genitourinary: Negative.   Musculoskeletal: Negative.  Negative for neck pain.  Skin: Negative.   Allergic/Immunologic: Negative.   Neurological: Positive for tremors (head and hands). Negative for dizziness, light-headedness and headaches.  Hematological: Negative.  Negative for adenopathy. Does not bruise/bleed easily.  Psychiatric/Behavioral: Positive for sleep disturbance. Negative for suicidal ideas, hallucinations, behavioral problems, confusion, self-injury, dysphoric mood, decreased concentration and agitation. The patient is nervous/anxious. The patient is not hyperactive.        Objective:   Physical Exam  Vitals  reviewed. Constitutional: She is oriented to person, place, and time. She appears well-developed and well-nourished. No distress.  HENT:  Head: Normocephalic and atraumatic.  Mouth/Throat: Oropharynx is clear and moist. No oropharyngeal exudate.  Eyes: Conjunctivae are normal. Right eye exhibits no discharge. Left eye exhibits no discharge. No scleral icterus.  Neck: Normal range of motion. Neck supple. No JVD present. No tracheal deviation present. No thyromegaly present.  Cardiovascular: Normal rate, regular rhythm, normal heart sounds and intact distal pulses.  Exam reveals no gallop and no friction rub.   No murmur heard. Pulmonary/Chest: Effort normal and breath sounds normal. No stridor. No respiratory distress. She has no wheezes. She has no rales. She exhibits no tenderness.  Abdominal: Soft. Bowel sounds are normal. She exhibits no distension and no mass. There is no tenderness. There is no rebound and no guarding.  Musculoskeletal: Normal range of motion. She exhibits no edema and no tenderness.  Lymphadenopathy:    She has no cervical adenopathy.  Neurological: She is alert and oriented to person, place, and time. She has normal strength. She displays tremor. She displays no atrophy. No sensory deficit. She exhibits normal muscle tone. She displays a negative Romberg sign. She displays no seizure activity. Coordination and gait normal.  Rest tremor noted in head and hands  Skin: Skin is warm and dry. No rash noted. She is not diaphoretic. No erythema. No pallor.  Psychiatric: She has a normal mood and affect. Her speech is normal and behavior is normal. Judgment and thought content normal. Her mood appears not anxious. Her affect is not angry, not blunt, not labile and not inappropriate. Cognition and memory are normal. She does not exhibit a depressed mood. She expresses no homicidal and no suicidal ideation. She expresses no suicidal plans and no homicidal plans.     Lab Results   Component Value Date  WBC 5.1 05/25/2010   HGB 13.5 05/25/2010   HCT 38.8 05/25/2010   PLT 265.0 05/25/2010   GLUCOSE 114* 08/27/2012   CHOL 181 08/27/2012   TRIG 242.0* 08/27/2012   HDL 43.80 08/27/2012   LDLDIRECT 100.6 08/27/2012   ALT 57* 08/27/2012   ALT 57* 08/27/2012   AST 60* 08/27/2012   AST 60* 08/27/2012   NA 141 08/27/2012   K 3.8 08/27/2012   CL 106 08/27/2012   CREATININE 0.8 08/27/2012   BUN 9 08/27/2012   CO2 26 08/27/2012   HGBA1C 5.8 08/27/2012       Assessment & Plan:

## 2013-07-22 NOTE — Progress Notes (Signed)
Pre visit review using our clinic review tool, if applicable. No additional management support is needed unless otherwise documented below in the visit note. 

## 2013-07-22 NOTE — Patient Instructions (Signed)
Tremor  Tremor is a rhythmic, involuntary muscular contraction characterized by oscillations (to-and-fro movements) of a part of the body. The most common of all involuntary movements, tremor can affect various body parts such as the hands, head, facial structures, vocal cords, trunk, and legs; most tremors, however, occur in the hands. Tremor often accompanies neurological disorders associated with aging. Although the disorder is not life-threatening, it can be responsible for functional disability and social embarrassment.  TREATMENT   There are many types of tremor and several ways in which tremor is classified. The most common classification is by behavioral context or position. There are five categories of tremor within this classification: resting, postural, kinetic, task-specific, and psychogenic. Resting or static tremor occurs when the muscle is at rest, for example when the hands are lying on the lap. This type of tremor is often seen in patients with Parkinson's disease. Postural tremor occurs when a patient attempts to maintain posture, such as holding the hands outstretched. Postural tremors include physiological tremor, essential tremor, tremor with basal ganglia disease (also seen in patients with Parkinson's disease), cerebellar postural tremor, tremor with peripheral neuropathy, post-traumatic tremor, and alcoholic tremor. Kinetic or intention (action) tremor occurs during purposeful movement, for example during finger-to-nose testing. Task-specific tremor appears when performing goal-oriented tasks such as handwriting, speaking, or standing. This group consists of primary writing tremor, vocal tremor, and orthostatic tremor. Psychogenic tremor occurs in both older and younger patients. The key feature of this tremor is that it dramatically lessens or disappears when the patient is distracted.  PROGNOSIS  There are some treatment options available for tremor; the appropriate treatment depends on  accurate diagnosis of the cause. Some tremors respond to treatment of the underlying condition, for example in some cases of psychogenic tremor treating the patient's underlying mental problem may cause the tremor to disappear. Also, patients with tremor due to Parkinson's disease may be treated with Levodopa drug therapy. Symptomatic drug therapy is available for several other tremors as well. For those cases of tremor in which there is no effective drug treatment, physical measures such as teaching the patient to brace the affected limb during the tremor are sometimes useful. Surgical intervention such as thalamotomy or deep brain stimulation may be useful in certain cases.  Document Released: 02/23/2002 Document Revised: 05/28/2011 Document Reviewed: 03/05/2005  [employer]® Patient Information ©2014 [employer].

## 2013-07-23 ENCOUNTER — Encounter: Payer: Self-pay | Admitting: Internal Medicine

## 2013-07-23 NOTE — Assessment & Plan Note (Signed)
She is pre-diabetic and will work on her lifestyle modifications

## 2013-07-23 NOTE — Assessment & Plan Note (Signed)
She will cont tranxene as needed 

## 2013-07-23 NOTE — Assessment & Plan Note (Signed)
Her BP is well controlled Lytes and renal function are stable 

## 2013-07-23 NOTE — Assessment & Plan Note (Signed)
This is starting to interfere with her writing - will refer to neurology for further evaluation

## 2013-11-05 LAB — HM MAMMOGRAPHY: HM MAMMO: NORMAL

## 2013-11-05 LAB — HM DEXA SCAN: HM Dexa Scan: -1.3

## 2013-11-13 ENCOUNTER — Encounter: Payer: Self-pay | Admitting: Internal Medicine

## 2013-11-17 ENCOUNTER — Encounter: Payer: Self-pay | Admitting: Internal Medicine

## 2013-11-17 ENCOUNTER — Ambulatory Visit (INDEPENDENT_AMBULATORY_CARE_PROVIDER_SITE_OTHER): Payer: Medicare Other | Admitting: Internal Medicine

## 2013-11-17 VITALS — BP 140/90 | HR 88 | Temp 98.3°F | Resp 16 | Ht 64.0 in | Wt 236.0 lb

## 2013-11-17 DIAGNOSIS — M949 Disorder of cartilage, unspecified: Secondary | ICD-10-CM

## 2013-11-17 DIAGNOSIS — I10 Essential (primary) hypertension: Secondary | ICD-10-CM

## 2013-11-17 DIAGNOSIS — Z23 Encounter for immunization: Secondary | ICD-10-CM

## 2013-11-17 DIAGNOSIS — E785 Hyperlipidemia, unspecified: Secondary | ICD-10-CM

## 2013-11-17 DIAGNOSIS — F411 Generalized anxiety disorder: Secondary | ICD-10-CM

## 2013-11-17 DIAGNOSIS — M899 Disorder of bone, unspecified: Secondary | ICD-10-CM

## 2013-11-17 MED ORDER — ATENOLOL 50 MG PO TABS: 50.0000 mg | ORAL_TABLET | Freq: Every day | ORAL | Status: DC

## 2013-11-17 MED ORDER — CLORAZEPATE DIPOTASSIUM 7.5 MG PO TABS: ORAL_TABLET | ORAL | Status: DC

## 2013-11-17 NOTE — Patient Instructions (Signed)

## 2013-11-17 NOTE — Progress Notes (Signed)
Pre visit review using our clinic review tool, if applicable. No additional management support is needed unless otherwise documented below in the visit note. 

## 2013-11-17 NOTE — Progress Notes (Signed)
   Subjective:    Patient ID: Stephanie Pham, female    DOB: [birth year], 63 y.o.   MRN: [id number]  Hypertension This is a chronic problem. The current episode started more than 1 year ago. The problem has been rapidly improving since onset. The problem is controlled. Associated symptoms include anxiety. Pertinent negatives include no blurred vision, chest pain, headaches, neck pain, orthopnea, palpitations, peripheral edema, PND, shortness of breath or sweats. There are no associated agents to hypertension. Risk factors for coronary artery disease include obesity. Past treatments include beta blockers, angiotensin blockers and diuretics. The current treatment provides significant improvement. Compliance problems include diet and exercise.  Hypertensive end-organ damage includes kidney disease. Identifiable causes of hypertension include chronic renal disease.      Review of Systems  Constitutional: Negative.  Negative for fever, chills, diaphoresis, appetite change and fatigue.  HENT: Negative.   Eyes: Negative.  Negative for blurred vision.  Respiratory: Negative.  Negative for apnea, cough, choking, chest tightness, shortness of breath, wheezing and stridor.   Cardiovascular: Negative.  Negative for chest pain, palpitations, orthopnea, leg swelling and PND.  Gastrointestinal: Negative.  Negative for nausea, vomiting, abdominal pain, diarrhea, constipation and blood in stool.  Endocrine: Negative.   Genitourinary: Negative.   Musculoskeletal: Negative.  Negative for arthralgias, back pain, neck pain and neck stiffness.  Skin: Negative.  Negative for rash.  Allergic/Immunologic: Negative.   Neurological: Negative.  Negative for dizziness, syncope, speech difficulty, weakness, light-headedness, numbness and headaches.  Hematological: Negative.  Negative for adenopathy.  Psychiatric/Behavioral: Negative.        Objective:   Physical Exam  Vitals reviewed. Constitutional: She is  oriented to person, place, and time. She appears well-developed and well-nourished. No distress.  HENT:  Head: Normocephalic and atraumatic.  Mouth/Throat: Oropharynx is clear and moist. No oropharyngeal exudate.  Eyes: Conjunctivae are normal. Right eye exhibits no discharge. Left eye exhibits no discharge. No scleral icterus.  Neck: Normal range of motion. Neck supple. No JVD present. No tracheal deviation present. No thyromegaly present.  Cardiovascular: Normal rate, regular rhythm, normal heart sounds and intact distal pulses.  Exam reveals no gallop and no friction rub.   No murmur heard. Pulmonary/Chest: Effort normal and breath sounds normal. No stridor. No respiratory distress. She has no wheezes. She has no rales. She exhibits no tenderness.  Abdominal: Soft. Bowel sounds are normal. She exhibits no distension and no mass. There is no tenderness. There is no rebound and no guarding.  Musculoskeletal: Normal range of motion. She exhibits no edema and no tenderness.  Lymphadenopathy:    She has no cervical adenopathy.  Neurological: She is oriented to person, place, and time.  Skin: Skin is warm and dry. No rash noted. She is not diaphoretic. No erythema. No pallor.     Lab Results  Component Value Date   WBC 5.1 05/25/2010   HGB 13.5 05/25/2010   HCT 38.8 05/25/2010   PLT 265.0 05/25/2010   GLUCOSE 105* 07/22/2013   CHOL 181 08/27/2012   TRIG 242.0* 08/27/2012   HDL 43.80 08/27/2012   LDLDIRECT 100.6 08/27/2012   ALT 39* 07/22/2013   AST 36 07/22/2013   NA 138 07/22/2013   K 3.5 07/22/2013   CL 102 07/22/2013   CREATININE 1.0 07/22/2013   BUN 14 07/22/2013   CO2 27 07/22/2013   HGBA1C 5.8 07/22/2013       Assessment & Plan:

## 2013-11-19 ENCOUNTER — Encounter: Payer: Self-pay | Admitting: Internal Medicine

## 2013-11-19 NOTE — Assessment & Plan Note (Signed)
Her BP is well controlled I will monitor her lytes and renal function 

## 2013-11-19 NOTE — Assessment & Plan Note (Signed)
She is doing well on zocor I will recheck her FLP to see if she has achieved her goal

## 2014-02-05 ENCOUNTER — Other Ambulatory Visit (INDEPENDENT_AMBULATORY_CARE_PROVIDER_SITE_OTHER): Payer: Medicare Other

## 2014-02-05 ENCOUNTER — Encounter: Payer: Self-pay | Admitting: Internal Medicine

## 2014-02-05 DIAGNOSIS — E785 Hyperlipidemia, unspecified: Secondary | ICD-10-CM

## 2014-02-05 DIAGNOSIS — I10 Essential (primary) hypertension: Secondary | ICD-10-CM

## 2014-02-05 LAB — COMPREHENSIVE METABOLIC PANEL
ALK PHOS: 84 U/L (ref 39–117)
ALT: 37 U/L — AB (ref 0–35)
AST: 35 U/L (ref 0–37)
Albumin: 4.4 g/dL (ref 3.5–5.2)
BILIRUBIN TOTAL: 0.8 mg/dL (ref 0.2–1.2)
BUN: 14 mg/dL (ref 6–23)
CO2: 27 meq/L (ref 19–32)
CREATININE: 0.8 mg/dL (ref 0.4–1.2)
Calcium: 9.6 mg/dL (ref 8.4–10.5)
Chloride: 101 mEq/L (ref 96–112)
GFR: 78.99 mL/min (ref >60.00)
Glucose, Bld: 85 mg/dL (ref 70–99)
Potassium: 3.7 mEq/L (ref 3.5–5.1)
SODIUM: 140 meq/L (ref 135–145)
Total Protein: 8.6 g/dL — ABNORMAL HIGH (ref 6.0–8.3)

## 2014-02-05 LAB — LIPID PANEL
CHOLESTEROL: 209 mg/dL — AB (ref 0–200)
HDL: 47.1 mg/dL (ref >39.00)
NonHDL: 161.9
Total CHOL/HDL Ratio: 4
Triglycerides: 318 mg/dL — ABNORMAL HIGH (ref 0.0–149.0)
VLDL: 63.6 mg/dL — AB (ref 0.0–40.0)

## 2014-02-05 LAB — LDL CHOLESTEROL, DIRECT: LDL DIRECT: 111.2 mg/dL

## 2014-02-08 ENCOUNTER — Telehealth: Payer: Self-pay | Admitting: Internal Medicine

## 2014-02-08 NOTE — Telephone Encounter (Signed)
emmi emailed °

## 2014-02-16 ENCOUNTER — Ambulatory Visit (INDEPENDENT_AMBULATORY_CARE_PROVIDER_SITE_OTHER): Payer: Medicare Other | Admitting: Internal Medicine

## 2014-02-16 ENCOUNTER — Encounter: Payer: Self-pay | Admitting: Internal Medicine

## 2014-02-16 VITALS — BP 112/80 | HR 67 | Temp 98.3°F | Resp 16 | Ht 64.0 in | Wt 239.0 lb

## 2014-02-16 DIAGNOSIS — I10 Essential (primary) hypertension: Secondary | ICD-10-CM

## 2014-02-16 DIAGNOSIS — Z0000 Encounter for general adult medical examination without abnormal findings: Secondary | ICD-10-CM

## 2014-02-16 NOTE — Progress Notes (Signed)
Subjective:    Patient ID: Stephanie Pham, female    DOB: Jun 08, 1950, 63 y.o.   MRN: [id number]  Hypertension This is a chronic problem. The current episode started more than 1 year ago. The problem has been gradually improving since onset. The problem is controlled. Associated symptoms include anxiety. Pertinent negatives include no blurred vision, chest pain, headaches, malaise/fatigue, neck pain, orthopnea, palpitations, peripheral edema, PND, shortness of breath or sweats. Past treatments include beta blockers, ACE inhibitors and diuretics. The current treatment provides moderate improvement. Compliance problems include exercise and diet.       Review of Systems  Constitutional: Negative.  Negative for fever, chills, malaise/fatigue, diaphoresis, appetite change, fatigue and unexpected weight change.  HENT: Negative.   Eyes: Negative.  Negative for blurred vision.  Respiratory: Negative.  Negative for cough, choking, chest tightness, shortness of breath and stridor.   Cardiovascular: Negative.  Negative for chest pain, palpitations, orthopnea, leg swelling and PND.  Gastrointestinal: Negative.  Negative for nausea, vomiting, abdominal pain, diarrhea, constipation and blood in stool.  Endocrine: Negative.   Genitourinary: Negative.   Musculoskeletal: Negative.  Negative for back pain, arthralgias and neck pain.  Skin: Negative.   Allergic/Immunologic: Negative.   Neurological: Negative.  Negative for dizziness, tremors, syncope, light-headedness and headaches.  Hematological: Negative.  Negative for adenopathy. Does not bruise/bleed easily.  Psychiatric/Behavioral: Negative.        Objective:   Physical Exam  Constitutional: She is oriented to person, place, and time. She appears well-developed and well-nourished. No distress.  HENT:  Head: Normocephalic and atraumatic.  Mouth/Throat: Oropharynx is clear and moist. No oropharyngeal exudate.  Eyes: Conjunctivae are normal.  Right eye exhibits no discharge. Left eye exhibits no discharge. No scleral icterus.  Neck: Normal range of motion. Neck supple. No JVD present. No tracheal deviation present. No thyromegaly present.  Cardiovascular: Normal rate, regular rhythm, normal heart sounds and intact distal pulses.  Exam reveals no gallop and no friction rub.   No murmur heard. Pulmonary/Chest: Effort normal and breath sounds normal. No stridor. No respiratory distress. She has no wheezes. She has no rales. She exhibits no tenderness.  Abdominal: Soft. Bowel sounds are normal. She exhibits no distension and no mass. There is no tenderness. There is no rebound and no guarding. Hernia confirmed negative in the right inguinal area and confirmed negative in the left inguinal area.  Genitourinary: Rectum normal. Rectal exam shows no external hemorrhoid, no internal hemorrhoid, no fissure, no mass, no tenderness and anal tone normal. Guaiac negative stool. No breast swelling, tenderness, discharge or bleeding. Pelvic exam was performed with patient supine. There is no rash, tenderness, lesion or injury on the right labia. There is no rash, tenderness, lesion or injury on the left labia. Cervix exhibits no discharge. Right adnexum displays no mass, no tenderness and no fullness. Left adnexum displays no mass, no tenderness and no fullness. No erythema, tenderness or bleeding in the vagina. No foreign body around the vagina. No signs of injury around the vagina. No vaginal discharge found.  Musculoskeletal: Normal range of motion. She exhibits no edema or tenderness.  Lymphadenopathy:    She has no cervical adenopathy.       Right: No inguinal adenopathy present.       Left: No inguinal adenopathy present.  Neurological: She is oriented to person, place, and time.  Skin: Skin is warm and dry. No rash noted. She is not diaphoretic. No erythema. No pallor.  Psychiatric: She has a  normal mood and affect. Her behavior is normal. Judgment  and thought content normal.  Vitals reviewed.     Lab Results  Component Value Date   WBC 5.1 05/25/2010   HGB 13.5 05/25/2010   HCT 38.8 05/25/2010   PLT 265.0 05/25/2010   GLUCOSE 85 02/05/2014   CHOL 209* 02/05/2014   TRIG 318.0* 02/05/2014   HDL 47.10 02/05/2014   LDLDIRECT 111.2 02/05/2014   ALT 37* 02/05/2014   AST 35 02/05/2014   NA 140 02/05/2014   K 3.7 02/05/2014   CL 101 02/05/2014   CREATININE 0.8 02/05/2014   BUN 14 02/05/2014   CO2 27 02/05/2014   HGBA1C 5.8 07/22/2013      Assessment & Plan:

## 2014-02-16 NOTE — Progress Notes (Signed)
Pre visit review using our clinic review tool, if applicable. No additional management support is needed unless otherwise documented below in the visit note. 

## 2014-02-16 NOTE — Patient Instructions (Signed)
Preventive Care for Adults A healthy lifestyle and preventive care can promote health and wellness. Preventive health guidelines for women include the following key practices.  A routine yearly physical is a good way to check with your health care provider about your health and preventive screening. It is a chance to share any concerns and updates on your health and to receive a thorough exam.  Visit your dentist for a routine exam and preventive care every 6 months. Brush your teeth twice a day and floss once a day. Good oral hygiene prevents tooth decay and gum disease.  The frequency of eye exams is based on your age, health, family medical history, use of contact lenses, and other factors. Follow your health care provider's recommendations for frequency of eye exams.  Eat a healthy diet. Foods like vegetables, fruits, whole grains, low-fat dairy products, and lean protein foods contain the nutrients you need without too many calories. Decrease your intake of foods high in solid fats, added sugars, and salt. Eat the right amount of calories for you.Get information about a proper diet from your health care provider, if necessary.  Regular physical exercise is one of the most important things you can do for your health. Most adults should get at least 150 minutes of moderate-intensity exercise (any activity that increases your heart rate and causes you to sweat) each week. In addition, most adults need muscle-strengthening exercises on 2 or more days a week.  Maintain a healthy weight. The body mass index (BMI) is a screening tool to identify possible weight problems. It provides an estimate of body fat based on height and weight. Your health care provider can find your BMI and can help you achieve or maintain a healthy weight.For adults 20 years and older:  A BMI below 18.5 is considered underweight.  A BMI of 18.5 to 24.9 is normal.  A BMI of 25 to 29.9 is considered overweight.  A BMI of  30 and above is considered obese.  Maintain normal blood lipids and cholesterol levels by exercising and minimizing your intake of saturated fat. Eat a balanced diet with plenty of fruit and vegetables. Blood tests for lipids and cholesterol should begin at age 76 and be repeated every 5 years. If your lipid or cholesterol levels are high, you are over 50, or you are at high risk for heart disease, you may need your cholesterol levels checked more frequently.Ongoing high lipid and cholesterol levels should be treated with medicines if diet and exercise are not working.  If you smoke, find out from your health care provider how to quit. If you do not use tobacco, do not start.  Lung cancer screening is recommended for adults aged 22-80 years who are at high risk for developing lung cancer because of a history of smoking. A yearly low-dose CT scan of the lungs is recommended for people who have at least a 30-pack-year history of smoking and are a current smoker or have quit within the past 15 years. A pack year of smoking is smoking an average of 1 pack of cigarettes a day for 1 year (for example: 1 pack a day for 30 years or 2 packs a day for 15 years). Yearly screening should continue until the smoker has stopped smoking for at least 15 years. Yearly screening should be stopped for people who develop a health problem that would prevent them from having lung cancer treatment.  If you are pregnant, do not drink alcohol. If you are breastfeeding,  be very cautious about drinking alcohol. If you are not pregnant and choose to drink alcohol, do not have more than 1 drink per day. One drink is considered to be 12 ounces (355 mL) of beer, 5 ounces (148 mL) of wine, or 1.5 ounces (44 mL) of liquor.  Avoid use of street drugs. Do not share needles with anyone. Ask for help if you need support or instructions about stopping the use of drugs.  High blood pressure causes heart disease and increases the risk of  stroke. Your blood pressure should be checked at least every 1 to 2 years. Ongoing high blood pressure should be treated with medicines if weight loss and exercise do not work.  If you are 75-52 years old, ask your health care provider if you should take aspirin to prevent strokes.  Diabetes screening involves taking a blood sample to check your fasting blood sugar level. This should be done once every 3 years, after age 15, if you are within normal weight and without risk factors for diabetes. Testing should be considered at a younger age or be carried out more frequently if you are overweight and have at least 1 risk factor for diabetes.  Breast cancer screening is essential preventive care for women. You should practice "breast self-awareness." This means understanding the normal appearance and feel of your breasts and may include breast self-examination. Any changes detected, no matter how small, should be reported to a health care provider. Women in their 58s and 30s should have a clinical breast exam (CBE) by a health care provider as part of a regular health exam every 1 to 3 years. After age 16, women should have a CBE every year. Starting at age 53, women should consider having a mammogram (breast X-ray test) every year. Women who have a family history of breast cancer should talk to their health care provider about genetic screening. Women at a high risk of breast cancer should talk to their health care providers about having an MRI and a mammogram every year.  Breast cancer gene (BRCA)-related cancer risk assessment is recommended for women who have family members with BRCA-related cancers. BRCA-related cancers include breast, ovarian, tubal, and peritoneal cancers. Having family members with these cancers may be associated with an increased risk for harmful changes (mutations) in the breast cancer genes BRCA1 and BRCA2. Results of the assessment will determine the need for genetic counseling and  BRCA1 and BRCA2 testing.  Routine pelvic exams to screen for cancer are no longer recommended for nonpregnant women who are considered low risk for cancer of the pelvic organs (ovaries, uterus, and vagina) and who do not have symptoms. Ask your health care provider if a screening pelvic exam is right for you.  If you have had past treatment for cervical cancer or a condition that could lead to cancer, you need Pap tests and screening for cancer for at least 20 years after your treatment. If Pap tests have been discontinued, your risk factors (such as having a new sexual partner) need to be reassessed to determine if screening should be resumed. Some women have medical problems that increase the chance of getting cervical cancer. In these cases, your health care provider may recommend more frequent screening and Pap tests.  The HPV test is an additional test that may be used for cervical cancer screening. The HPV test looks for the virus that can cause the cell changes on the cervix. The cells collected during the Pap test can be  tested for HPV. The HPV test could be used to screen women aged 30 years and older, and should be used in women of any age who have unclear Pap test results. After the age of 30, women should have HPV testing at the same frequency as a Pap test.  Colorectal cancer can be detected and often prevented. Most routine colorectal cancer screening begins at the age of 50 years and continues through age 75 years. However, your health care provider may recommend screening at an earlier age if you have risk factors for colon cancer. On a yearly basis, your health care provider may provide home test kits to check for hidden blood in the stool. Use of a small camera at the end of a tube, to directly examine the colon (sigmoidoscopy or colonoscopy), can detect the earliest forms of colorectal cancer. Talk to your health care provider about this at age 50, when routine screening begins. Direct  exam of the colon should be repeated every 5-10 years through age 75 years, unless early forms of pre-cancerous polyps or small growths are found.  People who are at an increased risk for hepatitis B should be screened for this virus. You are considered at high risk for hepatitis B if:  You were born in a country where hepatitis B occurs often. Talk with your health care provider about which countries are considered high risk.  Your parents were born in a high-risk country and you have not received a shot to protect against hepatitis B (hepatitis B vaccine).  You have HIV or AIDS.  You use needles to inject street drugs.  You live with, or have sex with, someone who has hepatitis B.  You get hemodialysis treatment.  You take certain medicines for conditions like cancer, organ transplantation, and autoimmune conditions.  Hepatitis C blood testing is recommended for all people born from 1945 through 1965 and any individual with known risks for hepatitis C.  Practice safe sex. Use condoms and avoid high-risk sexual practices to reduce the spread of sexually transmitted infections (STIs). STIs include gonorrhea, chlamydia, syphilis, trichomonas, herpes, HPV, and human immunodeficiency virus (HIV). Herpes, HIV, and HPV are viral illnesses that have no cure. They can result in disability, cancer, and death.  You should be screened for sexually transmitted illnesses (STIs) including gonorrhea and chlamydia if:  You are sexually active and are younger than 24 years.  You are older than 24 years and your health care provider tells you that you are at risk for this type of infection.  Your sexual activity has changed since you were last screened and you are at an increased risk for chlamydia or gonorrhea. Ask your health care provider if you are at risk.  If you are at risk of being infected with HIV, it is recommended that you take a prescription medicine daily to prevent HIV infection. This is  called preexposure prophylaxis (PrEP). You are considered at risk if:  You are a heterosexual woman, are sexually active, and are at increased risk for HIV infection.  You take drugs by injection.  You are sexually active with a partner who has HIV.  Talk with your health care provider about whether you are at high risk of being infected with HIV. If you choose to begin PrEP, you should first be tested for HIV. You should then be tested every 3 months for as long as you are taking PrEP.  Osteoporosis is a disease in which the bones lose minerals and strength   with aging. This can result in serious bone fractures or breaks. The risk of osteoporosis can be identified using a bone density scan. Women ages 65 years and over and women at risk for fractures or osteoporosis should discuss screening with their health care providers. Ask your health care provider whether you should take a calcium supplement or vitamin D to reduce the rate of osteoporosis.  Menopause can be associated with physical symptoms and risks. Hormone replacement therapy is available to decrease symptoms and risks. You should talk to your health care provider about whether hormone replacement therapy is right for you.  Use sunscreen. Apply sunscreen liberally and repeatedly throughout the day. You should seek shade when your shadow is shorter than you. Protect yourself by wearing long sleeves, pants, a wide-brimmed hat, and sunglasses year round, whenever you are outdoors.  Once a month, do a whole body skin exam, using a mirror to look at the skin on your back. Tell your health care provider of new moles, moles that have irregular borders, moles that are larger than a pencil eraser, or moles that have changed in shape or color.  Stay current with required vaccines (immunizations).  Influenza vaccine. All adults should be immunized every year.  Tetanus, diphtheria, and acellular pertussis (Td, Tdap) vaccine. Pregnant women should  receive 1 dose of Tdap vaccine during each pregnancy. The dose should be obtained regardless of the length of time since the last dose. Immunization is preferred during the 27th-36th week of gestation. An adult who has not previously received Tdap or who does not know her vaccine status should receive 1 dose of Tdap. This initial dose should be followed by tetanus and diphtheria toxoids (Td) booster doses every 10 years. Adults with an unknown or incomplete history of completing a 3-dose immunization series with Td-containing vaccines should begin or complete a primary immunization series including a Tdap dose. Adults should receive a Td booster every 10 years.  Varicella vaccine. An adult without evidence of immunity to varicella should receive 2 doses or a second dose if she has previously received 1 dose. Pregnant females who do not have evidence of immunity should receive the first dose after pregnancy. This first dose should be obtained before leaving the health care facility. The second dose should be obtained 4-8 weeks after the first dose.  Human papillomavirus (HPV) vaccine. Females aged 13-26 years who have not received the vaccine previously should obtain the 3-dose series. The vaccine is not recommended for use in pregnant females. However, pregnancy testing is not needed before receiving a dose. If a female is found to be pregnant after receiving a dose, no treatment is needed. In that case, the remaining doses should be delayed until after the pregnancy. Immunization is recommended for any person with an immunocompromised condition through the age of 26 years if she did not get any or all doses earlier. During the 3-dose series, the second dose should be obtained 4-8 weeks after the first dose. The third dose should be obtained 24 weeks after the first dose and 16 weeks after the second dose.  Zoster vaccine. One dose is recommended for adults aged 60 years or older unless certain conditions are  present.  Measles, mumps, and rubella (MMR) vaccine. Adults born before 1957 generally are considered immune to measles and mumps. Adults born in 1957 or later should have 1 or more doses of MMR vaccine unless there is a contraindication to the vaccine or there is laboratory evidence of immunity to   each of the three diseases. A routine second dose of MMR vaccine should be obtained at least 28 days after the first dose for students attending postsecondary schools, health care workers, or international travelers. People who received inactivated measles vaccine or an unknown type of measles vaccine during 1963-1967 should receive 2 doses of MMR vaccine. People who received inactivated mumps vaccine or an unknown type of mumps vaccine before 1979 and are at high risk for mumps infection should consider immunization with 2 doses of MMR vaccine. For females of childbearing age, rubella immunity should be determined. If there is no evidence of immunity, females who are not pregnant should be vaccinated. If there is no evidence of immunity, females who are pregnant should delay immunization until after pregnancy. Unvaccinated health care workers born before 1957 who lack laboratory evidence of measles, mumps, or rubella immunity or laboratory confirmation of disease should consider measles and mumps immunization with 2 doses of MMR vaccine or rubella immunization with 1 dose of MMR vaccine.  Pneumococcal 13-valent conjugate (PCV13) vaccine. When indicated, a person who is uncertain of her immunization history and has no record of immunization should receive the PCV13 vaccine. An adult aged 19 years or older who has certain medical conditions and has not been previously immunized should receive 1 dose of PCV13 vaccine. This PCV13 should be followed with a dose of pneumococcal polysaccharide (PPSV23) vaccine. The PPSV23 vaccine dose should be obtained at least 8 weeks after the dose of PCV13 vaccine. An adult aged 19  years or older who has certain medical conditions and previously received 1 or more doses of PPSV23 vaccine should receive 1 dose of PCV13. The PCV13 vaccine dose should be obtained 1 or more years after the last PPSV23 vaccine dose.  Pneumococcal polysaccharide (PPSV23) vaccine. When PCV13 is also indicated, PCV13 should be obtained first. All adults aged 65 years and older should be immunized. An adult younger than age 65 years who has certain medical conditions should be immunized. Any person who resides in a nursing home or long-term care facility should be immunized. An adult smoker should be immunized. People with an immunocompromised condition and certain other conditions should receive both PCV13 and PPSV23 vaccines. People with human immunodeficiency virus (HIV) infection should be immunized as soon as possible after diagnosis. Immunization during chemotherapy or radiation therapy should be avoided. Routine use of PPSV23 vaccine is not recommended for American Indians, [place] Natives, or people younger than 65 years unless there are medical conditions that require PPSV23 vaccine. When indicated, people who have unknown immunization and have no record of immunization should receive PPSV23 vaccine. One-time revaccination 5 years after the first dose of PPSV23 is recommended for people aged 19-64 years who have chronic kidney failure, nephrotic syndrome, asplenia, or immunocompromised conditions. People who received 1-2 doses of PPSV23 before age 65 years should receive another dose of PPSV23 vaccine at age 65 years or later if at least 5 years have passed since the previous dose. Doses of PPSV23 are not needed for people immunized with PPSV23 at or after age 65 years.  Meningococcal vaccine. Adults with asplenia or persistent complement component deficiencies should receive 2 doses of quadrivalent meningococcal conjugate (MenACWY-D) vaccine. The doses should be obtained at least 2 months apart.  Microbiologists working with certain meningococcal bacteria, military recruits, people at risk during an outbreak, and people who travel to or live in countries with a high rate of meningitis should be immunized. A first-year college student up through age   21 years who is living in a residence hall should receive a dose if she did not receive a dose on or after her 16th birthday. Adults who have certain high-risk conditions should receive one or more doses of vaccine.  Hepatitis A vaccine. Adults who wish to be protected from this disease, have certain high-risk conditions, work with hepatitis A-infected animals, work in hepatitis A research labs, or travel to or work in countries with a high rate of hepatitis A should be immunized. Adults who were previously unvaccinated and who anticipate close contact with an international adoptee during the first 60 days after arrival in the [place] from a country with a high rate of hepatitis A should be immunized.  Hepatitis B vaccine. Adults who wish to be protected from this disease, have certain high-risk conditions, may be exposed to blood or other infectious body fluids, are household contacts or sex partners of hepatitis B positive people, are clients or workers in certain care facilities, or travel to or work in countries with a high rate of hepatitis B should be immunized.  Haemophilus influenzae type b (Hib) vaccine. A previously unvaccinated person with asplenia or sickle cell disease or having a scheduled splenectomy should receive 1 dose of Hib vaccine. Regardless of previous immunization, a recipient of a hematopoietic stem cell transplant should receive a 3-dose series 6-12 months after her successful transplant. Hib vaccine is not recommended for adults with HIV infection. Preventive Services / Frequency Ages 64 to 68 years  Blood pressure check.** / Every 1 to 2 years.  Lipid and cholesterol check.** / Every 5 years beginning at age  22.  Clinical breast exam.** / Every 3 years for women in their 88s and 53s.  BRCA-related cancer risk assessment.** / For women who have family members with a BRCA-related cancer (breast, ovarian, tubal, or peritoneal cancers).  Pap test.** / Every 2 years from ages 90 through 51. Every 3 years starting at age 21 through age 56 or 3 with a history of 3 consecutive normal Pap tests.  HPV screening.** / Every 3 years from ages 24 through ages 1 to 46 with a history of 3 consecutive normal Pap tests.  Hepatitis C blood test.** / For any individual with known risks for hepatitis C.  Skin self-exam. / Monthly.  Influenza vaccine. / Every year.  Tetanus, diphtheria, and acellular pertussis (Tdap, Td) vaccine.** / Consult your health care provider. Pregnant women should receive 1 dose of Tdap vaccine during each pregnancy. 1 dose of Td every 10 years.  Varicella vaccine.** / Consult your health care provider. Pregnant females who do not have evidence of immunity should receive the first dose after pregnancy.  HPV vaccine. / 3 doses over 6 months, if 72 and younger. The vaccine is not recommended for use in pregnant females. However, pregnancy testing is not needed before receiving a dose.  Measles, mumps, rubella (MMR) vaccine.** / You need at least 1 dose of MMR if you were born in 1957 or later. You may also need a 2nd dose. For females of childbearing age, rubella immunity should be determined. If there is no evidence of immunity, females who are not pregnant should be vaccinated. If there is no evidence of immunity, females who are pregnant should delay immunization until after pregnancy.  Pneumococcal 13-valent conjugate (PCV13) vaccine.** / Consult your health care provider.  Pneumococcal polysaccharide (PPSV23) vaccine.** / 1 to 2 doses if you smoke cigarettes or if you have certain conditions.  Meningococcal vaccine.** /  1 dose if you are age 19 to 21 years and a first-year college  student living in a residence hall, or have one of several medical conditions, you need to get vaccinated against meningococcal disease. You may also need additional booster doses.  Hepatitis A vaccine.** / Consult your health care provider.  Hepatitis B vaccine.** / Consult your health care provider.  Haemophilus influenzae type b (Hib) vaccine.** / Consult your health care provider. Ages 40 to 64 years  Blood pressure check.** / Every 1 to 2 years.  Lipid and cholesterol check.** / Every 5 years beginning at age 20 years.  Lung cancer screening. / Every year if you are aged 55-80 years and have a 30-pack-year history of smoking and currently smoke or have quit within the past 15 years. Yearly screening is stopped once you have quit smoking for at least 15 years or develop a health problem that would prevent you from having lung cancer treatment.  Clinical breast exam.** / Every year after age 40 years.  BRCA-related cancer risk assessment.** / For women who have family members with a BRCA-related cancer (breast, ovarian, tubal, or peritoneal cancers).  Mammogram.** / Every year beginning at age 40 years and continuing for as long as you are in good health. Consult with your health care provider.  Pap test.** / Every 3 years starting at age 30 years through age 65 or 70 years with a history of 3 consecutive normal Pap tests.  HPV screening.** / Every 3 years from ages 30 years through ages 65 to 70 years with a history of 3 consecutive normal Pap tests.  Fecal occult blood test (FOBT) of stool. / Every year beginning at age 50 years and continuing until age 75 years. You may not need to do this test if you get a colonoscopy every 10 years.  Flexible sigmoidoscopy or colonoscopy.** / Every 5 years for a flexible sigmoidoscopy or every 10 years for a colonoscopy beginning at age 50 years and continuing until age 75 years.  Hepatitis C blood test.** / For all people born from 1945 through  1965 and any individual with known risks for hepatitis C.  Skin self-exam. / Monthly.  Influenza vaccine. / Every year.  Tetanus, diphtheria, and acellular pertussis (Tdap/Td) vaccine.** / Consult your health care provider. Pregnant women should receive 1 dose of Tdap vaccine during each pregnancy. 1 dose of Td every 10 years.  Varicella vaccine.** / Consult your health care provider. Pregnant females who do not have evidence of immunity should receive the first dose after pregnancy.  Zoster vaccine.** / 1 dose for adults aged 60 years or older.  Measles, mumps, rubella (MMR) vaccine.** / You need at least 1 dose of MMR if you were born in 1957 or later. You may also need a 2nd dose. For females of childbearing age, rubella immunity should be determined. If there is no evidence of immunity, females who are not pregnant should be vaccinated. If there is no evidence of immunity, females who are pregnant should delay immunization until after pregnancy.  Pneumococcal 13-valent conjugate (PCV13) vaccine.** / Consult your health care provider.  Pneumococcal polysaccharide (PPSV23) vaccine.** / 1 to 2 doses if you smoke cigarettes or if you have certain conditions.  Meningococcal vaccine.** / Consult your health care provider.  Hepatitis A vaccine.** / Consult your health care provider.  Hepatitis B vaccine.** / Consult your health care provider.  Haemophilus influenzae type b (Hib) vaccine.** / Consult your health care provider. Ages 65   years and over  Blood pressure check.** / Every 1 to 2 years.  Lipid and cholesterol check.** / Every 5 years beginning at age 21 years.  Lung cancer screening. / Every year if you are aged 55-80 years and have a 30-pack-year history of smoking and currently smoke or have quit within the past 15 years. Yearly screening is stopped once you have quit smoking for at least 15 years or develop a health problem that would prevent you from having lung cancer  treatment.  Clinical breast exam.** / Every year after age 70 years.  BRCA-related cancer risk assessment.** / For women who have family members with a BRCA-related cancer (breast, ovarian, tubal, or peritoneal cancers).  Mammogram.** / Every year beginning at age 26 years and continuing for as long as you are in good health. Consult with your health care provider.  Pap test.** / Every 3 years starting at age 66 years through age 90 or 46 years with 3 consecutive normal Pap tests. Testing can be stopped between 65 and 70 years with 3 consecutive normal Pap tests and no abnormal Pap or HPV tests in the past 10 years.  HPV screening.** / Every 3 years from ages 40 years through ages 72 or 58 years with a history of 3 consecutive normal Pap tests. Testing can be stopped between 65 and 70 years with 3 consecutive normal Pap tests and no abnormal Pap or HPV tests in the past 10 years.  Fecal occult blood test (FOBT) of stool. / Every year beginning at age 35 years and continuing until age 31 years. You may not need to do this test if you get a colonoscopy every 10 years.  Flexible sigmoidoscopy or colonoscopy.** / Every 5 years for a flexible sigmoidoscopy or every 10 years for a colonoscopy beginning at age 86 years and continuing until age 5 years.  Hepatitis C blood test.** / For all people born from [birth year] through 1966 and any individual with known risks for hepatitis C.  Osteoporosis screening.** / A one-time screening for women ages 74 years and over and women at risk for fractures or osteoporosis.  Skin self-exam. / Monthly.  Influenza vaccine. / Every year.  Tetanus, diphtheria, and acellular pertussis (Tdap/Td) vaccine.** / 1 dose of Td every 10 years.  Varicella vaccine.** / Consult your health care provider.  Zoster vaccine.** / 1 dose for adults aged 23 years or older.  Pneumococcal 13-valent conjugate (PCV13) vaccine.** / Consult your health care provider.  Pneumococcal  polysaccharide (PPSV23) vaccine.** / 1 dose for all adults aged 77 years and older.  Meningococcal vaccine.** / Consult your health care provider.  Hepatitis A vaccine.** / Consult your health care provider.  Hepatitis B vaccine.** / Consult your health care provider.  Haemophilus influenzae type b (Hib) vaccine.** / Consult your health care provider. ** Family history and personal history of risk and conditions may change your health care provider's recommendations. Document Released: 05/01/2001 Document Revised: 07/20/2013 Document Reviewed: 07/31/2010 [facility] Patient Information 2015 [place], [place]. This information is not intended to replace advice given to you by your health care provider. Make sure you discuss any questions you have with your health care provider. Food Choices to Lower Your Triglycerides  Triglycerides are a type of fat in your blood. High levels of triglycerides can increase the risk of heart disease and stroke. If your triglyceride levels are high, the foods you eat and your eating habits are very important. Choosing the right foods can help lower your  triglycerides.  WHAT GENERAL GUIDELINES DO I NEED TO FOLLOW?  Lose weight if you are overweight.   Limit or avoid alcohol.   Fill one half of your plate with vegetables and green salads.   Limit fruit to two servings a day. Choose fruit instead of juice.   Make one fourth of your plate whole grains. Look for the word "whole" as the first word in the ingredient list.  Fill one fourth of your plate with lean protein foods.  Enjoy fatty fish (such as salmon, mackerel, sardines, and tuna) three times a week.   Choose healthy fats.   Limit foods high in starch and sugar.  Eat more home-cooked food and less restaurant, buffet, and fast food.  Limit fried foods.  Cook foods using methods other than frying.  Limit saturated fats.  Check ingredient lists to avoid foods with partially hydrogenated oils  (trans fats) in them. WHAT FOODS CAN I EAT?  Grains Whole grains, such as whole wheat or whole grain breads, crackers, cereals, and pasta. Unsweetened oatmeal, bulgur, barley, quinoa, or brown rice. Corn or whole wheat flour tortillas.  Vegetables Fresh or frozen vegetables (raw, steamed, roasted, or grilled). Green salads. Fruits All fresh, canned (in natural juice), or frozen fruits. Meat and Other Protein Products Ground beef (85% or leaner), grass-fed beef, or beef trimmed of fat. Skinless chicken or [place]. Ground chicken or [place]. Pork trimmed of fat. All fish and seafood. Eggs. Dried beans, peas, or lentils. Unsalted nuts or seeds. Unsalted canned or dry beans. Dairy Low-fat dairy products, such as skim or 1% milk, 2% or reduced-fat cheeses, low-fat ricotta or cottage cheese, or plain low-fat yogurt. Fats and Oils Tub margarines without trans fats. Light or reduced-fat mayonnaise and salad dressings. Avocado. Safflower, olive, or canola oils. Natural peanut or almond butter. The items listed above may not be a complete list of recommended foods or beverages. Contact your dietitian for more options. WHAT FOODS ARE NOT RECOMMENDED?  Grains White bread. White pasta. White rice. Cornbread. Bagels, pastries, and croissants. Crackers that contain trans fat. Vegetables White potatoes. Corn. Creamed or fried vegetables. Vegetables in a cheese sauce. Fruits Dried fruits. Canned fruit in light or heavy syrup. Fruit juice. Meat and Other Protein Products Fatty cuts of meat. Ribs, chicken wings, bacon, sausage, bologna, salami, chitterlings, fatback, hot dogs, bratwurst, and packaged luncheon meats. Dairy Whole or 2% milk, cream, half-and-half, and cream cheese. Whole-fat or sweetened yogurt. Full-fat cheeses. Nondairy creamers and whipped toppings. Processed cheese, cheese spreads, or cheese curds. Sweets and Desserts Corn syrup, sugars, honey, and molasses. Candy. Jam and jelly. Syrup.  Sweetened cereals. Cookies, pies, cakes, donuts, muffins, and ice cream. Fats and Oils Butter, stick margarine, lard, shortening, ghee, or bacon fat. Coconut, palm kernel, or palm oils. Beverages Alcohol. Sweetened drinks (such as sodas, lemonade, and fruit drinks or punches). The items listed above may not be a complete list of foods and beverages to avoid. Contact your dietitian for more information. Document Released: 12/22/2003 Document Revised: 03/10/2013 Document Reviewed: 01/07/2013 [facility] Patient Information 2015 [place], [place]. This information is not intended to replace advice given to you by your health care provider. Make sure you discuss any questions you have with your health care provider.

## 2014-02-19 NOTE — Assessment & Plan Note (Signed)
Her BP is well controlled 

## 2014-02-19 NOTE — Assessment & Plan Note (Signed)
Exam done Vaccines were reviewed and updated Labs reviewed Pt ed material was given

## 2014-06-21 ENCOUNTER — Ambulatory Visit (INDEPENDENT_AMBULATORY_CARE_PROVIDER_SITE_OTHER): Payer: Medicare Other | Admitting: Internal Medicine

## 2014-06-21 VITALS — BP 114/74 | HR 62 | Temp 97.8°F | Resp 16 | Ht 64.0 in | Wt 234.0 lb

## 2014-06-21 DIAGNOSIS — I10 Essential (primary) hypertension: Secondary | ICD-10-CM

## 2014-06-21 DIAGNOSIS — E785 Hyperlipidemia, unspecified: Secondary | ICD-10-CM | POA: Diagnosis not present

## 2014-06-21 DIAGNOSIS — F411 Generalized anxiety disorder: Secondary | ICD-10-CM

## 2014-06-21 MED ORDER — CLORAZEPATE DIPOTASSIUM 7.5 MG PO TABS: ORAL_TABLET | ORAL | Status: DC

## 2014-06-21 MED ORDER — SIMVASTATIN 10 MG PO TABS: 10.0000 mg | ORAL_TABLET | Freq: Every day | ORAL | Status: DC

## 2014-06-21 MED ORDER — FOSINOPRIL SODIUM-HCTZ 20-12.5 MG PO TABS: 1.0000 | ORAL_TABLET | Freq: Every day | ORAL | Status: DC

## 2014-06-21 NOTE — Patient Instructions (Signed)

## 2014-06-21 NOTE — Progress Notes (Signed)
   Subjective:    Patient ID: Stephanie Pham, female    DOB: [birth year], 64 y.o.   MRN: [id number]  Hypertension This is a chronic problem. The current episode started more than 1 year ago. The problem is unchanged. The problem is controlled. Pertinent negatives include no anxiety, blurred vision, chest pain, headaches, malaise/fatigue, neck pain, orthopnea, palpitations, peripheral edema, PND, shortness of breath or sweats. Past treatments include beta blockers, ACE inhibitors and diuretics. The current treatment provides significant improvement. Compliance problems include diet and exercise.       Review of Systems  Constitutional: Negative for fever, chills, malaise/fatigue, diaphoresis, appetite change and fatigue.  HENT: Negative.   Eyes: Negative.  Negative for blurred vision.  Respiratory: Negative.  Negative for cough, choking, chest tightness, shortness of breath and stridor.   Cardiovascular: Negative.  Negative for chest pain, palpitations, orthopnea, leg swelling and PND.  Gastrointestinal: Negative.  Negative for nausea, vomiting, abdominal pain, diarrhea, constipation and blood in stool.  Endocrine: Negative.   Genitourinary: Negative.  Negative for dysuria, urgency, hematuria, decreased urine volume and difficulty urinating.  Musculoskeletal: Negative.  Negative for myalgias, back pain, arthralgias and neck pain.  Skin: Negative.   Allergic/Immunologic: Negative.   Neurological: Negative.  Negative for dizziness, syncope, speech difficulty, weakness, light-headedness, numbness and headaches.  Hematological: Negative.  Negative for adenopathy. Does not bruise/bleed easily.  Psychiatric/Behavioral: Negative for sleep disturbance, dysphoric mood and decreased concentration. The patient is nervous/anxious.        Objective:   Physical Exam  Constitutional: She is oriented to person, place, and time. She appears well-developed and well-nourished. No distress.  HENT:  Head:  Normocephalic and atraumatic.  Mouth/Throat: Oropharynx is clear and moist. No oropharyngeal exudate.  Eyes: Conjunctivae are normal. Right eye exhibits no discharge. Left eye exhibits no discharge. No scleral icterus.  Neck: Normal range of motion. Neck supple. No JVD present. No tracheal deviation present. No thyromegaly present.  Cardiovascular: Normal rate, regular rhythm, normal heart sounds and intact distal pulses.  Exam reveals no gallop and no friction rub.   No murmur heard. Pulmonary/Chest: Effort normal and breath sounds normal. No stridor. No respiratory distress. She has no wheezes. She has no rales. She exhibits no tenderness.  Abdominal: Soft. Bowel sounds are normal. She exhibits no distension and no mass. There is no tenderness. There is no rebound and no guarding.  Musculoskeletal: Normal range of motion. She exhibits no edema or tenderness.  Lymphadenopathy:    She has no cervical adenopathy.  Neurological: She is oriented to person, place, and time.  Skin: Skin is warm and dry. No rash noted. She is not diaphoretic. No erythema. No pallor.  Vitals reviewed.    Lab Results  Component Value Date   WBC 5.1 05/25/2010   HGB 13.5 05/25/2010   HCT 38.8 05/25/2010   PLT 265.0 05/25/2010   GLUCOSE 85 02/05/2014   CHOL 209* 02/05/2014   TRIG 318.0* 02/05/2014   HDL 47.10 02/05/2014   LDLDIRECT 111.2 02/05/2014   ALT 37* 02/05/2014   AST 35 02/05/2014   NA 140 02/05/2014   K 3.7 02/05/2014   CL 101 02/05/2014   CREATININE 0.8 02/05/2014   BUN 14 02/05/2014   CO2 27 02/05/2014   HGBA1C 5.8 07/22/2013       Assessment & Plan:

## 2014-06-22 ENCOUNTER — Encounter: Payer: Self-pay | Admitting: Internal Medicine

## 2014-06-22 NOTE — Assessment & Plan Note (Signed)
She is working on her lifestyle modifications to lower her trigs  She is not fasting today Will recheck this next visit while fasting

## 2014-06-22 NOTE — Assessment & Plan Note (Signed)
Her BP is well controlled Lytes and renal function are stable 

## 2014-06-23 DIAGNOSIS — L821 Other seborrheic keratosis: Secondary | ICD-10-CM | POA: Diagnosis not present

## 2014-06-23 DIAGNOSIS — D225 Melanocytic nevi of trunk: Secondary | ICD-10-CM | POA: Diagnosis not present

## 2014-06-23 DIAGNOSIS — D485 Neoplasm of uncertain behavior of skin: Secondary | ICD-10-CM | POA: Diagnosis not present

## 2014-06-23 DIAGNOSIS — L814 Other melanin hyperpigmentation: Secondary | ICD-10-CM | POA: Diagnosis not present

## 2014-07-13 DIAGNOSIS — H3531 Nonexudative age-related macular degeneration: Secondary | ICD-10-CM | POA: Diagnosis not present

## 2014-07-13 DIAGNOSIS — Z961 Presence of intraocular lens: Secondary | ICD-10-CM | POA: Diagnosis not present

## 2014-07-13 DIAGNOSIS — H5213 Myopia, bilateral: Secondary | ICD-10-CM | POA: Diagnosis not present

## 2014-07-13 DIAGNOSIS — H3500 Unspecified background retinopathy: Secondary | ICD-10-CM | POA: Diagnosis not present

## 2014-11-16 DIAGNOSIS — Z1231 Encounter for screening mammogram for malignant neoplasm of breast: Secondary | ICD-10-CM | POA: Diagnosis not present

## 2014-11-16 LAB — HM MAMMOGRAPHY

## 2014-11-17 ENCOUNTER — Encounter: Payer: Self-pay | Admitting: Internal Medicine

## 2014-12-21 ENCOUNTER — Telehealth: Payer: Self-pay

## 2014-12-21 NOTE — Telephone Encounter (Signed)
Call to introduce AWV as CPE is next week. The  patient stated she was going to be in [facility] tomorrow am and can come in the morning; apt made for 10/05 at 10am/ call call if spouse's MRI runs over etc;

## 2014-12-22 ENCOUNTER — Ambulatory Visit (INDEPENDENT_AMBULATORY_CARE_PROVIDER_SITE_OTHER): Payer: Medicare Other

## 2014-12-22 DIAGNOSIS — Z23 Encounter for immunization: Secondary | ICD-10-CM

## 2014-12-22 DIAGNOSIS — Z0000 Encounter for general adult medical examination without abnormal findings: Secondary | ICD-10-CM | POA: Diagnosis not present

## 2014-12-22 NOTE — Patient Instructions (Signed)
Stephanie Pham , Thank you for taking time to come for your Medicare Wellness Visit. I appreciate your ongoing commitment to your health goals. Please review the following plan we discussed and let me know if I can assist you in the future.   Will take prevnar next week;  Will take flu shot today Will discuss recent loss of brother with Dr. Ronnald Ramp; Ua issues with Dr. Ronnald Ramp  Discussed better "low fat options"   Referral to [facility]  [facility]  at [facility]   These are the goals we discussed: Goals    . Exercise 150 minutes per week (moderate activity)     Will start stationary bike riding and build up to 30 min 3 to 4 times per day  Fat free or low fat dairy products Fish high in omega-3 acids ( salmon, tuna, trout) Fruits, such as apples, bananas, oranges, pears, prunes Legumes, such as kidney beans, lentils, checkpeas, black-eyed peas and lima beans Vegetables; broccoli, cabbage, carrots Whole grains;   Plant fats are better; decrease "white" foods as pasta, rice, bread and desserts, sugar; Avoid red meat (limiting) palm and coconut oils; sugary foods and beverages  Two nutrients that raise blood chol levels are saturated fats and trans fat; in hydrogenated oils and fats, as stick margarine, baked goods (cookes, cakes, pies, crackers; frosting; and coffee creamers;   Some Fats lower cholesterol: Monounsaturated and polyunsaturated  Avocados Corn, sunflower, and soybean oils Nuts and seeds, such as walnuts Olive, canola, peanut, safflower, and sesame oils Peanut butter Salmon and trout Tofu          This is a list of the screening recommended for you and due dates:  Health Maintenance  Topic Date Due  .  Hepatitis C: One time screening is recommended by Center for Disease Control  (CDC) for  adults born from [birth year] through 1966.   13-Nov-1950  . HIV Screening  12/10/1964  . Pap Smear  12/11/1970  . Shingles Vaccine  12/10/2009  . Flu Shot  10/18/2014  . Pneumonia vaccines (1 of  2 - PCV13) 12/11/2014  . Colon Cancer Screening  11/05/2016  . Mammogram  11/15/2016  . Tetanus Vaccine  02/10/2023  . DEXA scan (bone density measurement)  Completed    Menopause is a normal process in which your reproductive ability comes to an end. This process happens gradually over a span of months to years, usually between the ages of 41 and 7. Menopause is complete when you have missed 12 consecutive menstrual periods. It is important to talk with your health care provider about some of the most common conditions that affect postmenopausal women, such as heart disease, cancer, and bone loss (osteoporosis). Adopting a healthy lifestyle and getting preventive care can help to promote your health and wellness. Those actions can also lower your chances of developing some of these common conditions. WHAT SHOULD I KNOW ABOUT MENOPAUSE? During menopause, you may experience a number of symptoms, such as:  Moderate-to-severe hot flashes.  Night sweats.  Decrease in sex drive.  Mood swings.  Headaches.  Tiredness.  Irritability.  Memory problems.  Insomnia. Choosing to treat or not to treat menopausal changes is an individual decision that you make with your health care provider. WHAT SHOULD I KNOW ABOUT HORMONE REPLACEMENT THERAPY AND SUPPLEMENTS? Hormone therapy products are effective for treating symptoms that are associated with menopause, such as hot flashes and night sweats. Hormone replacement carries certain risks, especially as you become older. If you are thinking about  using estrogen or estrogen with progestin treatments, discuss the benefits and risks with your health care provider. WHAT SHOULD I KNOW ABOUT HEART DISEASE AND STROKE? Heart disease, heart attack, and stroke become more likely as you age. This may be due, in part, to the hormonal changes that your body experiences during menopause. These can affect how your body processes dietary fats, triglycerides, and  cholesterol. Heart attack and stroke are both medical emergencies. There are many things that you can do to help prevent heart disease and stroke:  Have your blood pressure checked at least every 1-2 years. High blood pressure causes heart disease and increases the risk of stroke.  If you are 61-59 years old, ask your health care provider if you should take aspirin to prevent a heart attack or a stroke.  Do not use any tobacco products, including cigarettes, chewing tobacco, or electronic cigarettes. If you need help quitting, ask your health care provider.  It is important to eat a healthy diet and maintain a healthy weight.  Be sure to include plenty of vegetables, fruits, low-fat dairy products, and lean protein.  Avoid eating foods that are high in solid fats, added sugars, or salt (sodium).  Get regular exercise. This is one of the most important things that you can do for your health.  Try to exercise for at least 150 minutes each week. The type of exercise that you do should increase your heart rate and make you sweat. This is known as moderate-intensity exercise.  Try to do strengthening exercises at least twice each week. Do these in addition to the moderate-intensity exercise.  Know your numbers.Ask your health care provider to check your cholesterol and your blood glucose. Continue to have your blood tested as directed by your health care provider. WHAT SHOULD I KNOW ABOUT CANCER SCREENING? There are several types of cancer. Take the following steps to reduce your risk and to catch any cancer development as early as possible. Breast Cancer  Practice breast self-awareness.  This means understanding how your breasts normally appear and feel.  It also means doing regular breast self-exams. Let your health care provider know about any changes, no matter how small.  If you are 10 or older, have a clinician do a breast exam (clinical breast exam or CBE) every year. Depending on  your age, family history, and medical history, it may be recommended that you also have a yearly breast X-ray (mammogram).  If you have a family history of breast cancer, talk with your health care provider about genetic screening.  If you are at high risk for breast cancer, talk with your health care provider about having an MRI and a mammogram every year.  Breast cancer (BRCA) gene test is recommended for women who have family members with BRCA-related cancers. Results of the assessment will determine the need for genetic counseling and BRCA1 and for BRCA2 testing. BRCA-related cancers include these types:  Breast. This occurs in males or females.  Ovarian.  Tubal. This may also be called fallopian tube cancer.  Cancer of the abdominal or pelvic lining (peritoneal cancer).  Prostate.  Pancreatic. Cervical, Uterine, and Ovarian Cancer Your health care provider may recommend that you be screened regularly for cancer of the pelvic organs. These include your ovaries, uterus, and vagina. This screening involves a pelvic exam, which includes checking for microscopic changes to the surface of your cervix (Pap test).  For women ages 21-65, health care providers may recommend a pelvic exam and a  Pap test every three years. For women ages 4-65, they may recommend the Pap test and pelvic exam, combined with testing for human papilloma virus (HPV), every five years. Some types of HPV increase your risk of cervical cancer. Testing for HPV may also be done on women of any age who have unclear Pap test results.  Other health care providers may not recommend any screening for nonpregnant women who are considered low risk for pelvic cancer and have no symptoms. Ask your health care provider if a screening pelvic exam is right for you.  If you have had past treatment for cervical cancer or a condition that could lead to cancer, you need Pap tests and screening for cancer for at least 20 years after your  treatment. If Pap tests have been discontinued for you, your risk factors (such as having a new sexual partner) need to be reassessed to determine if you should start having screenings again. Some women have medical problems that increase the chance of getting cervical cancer. In these cases, your health care provider may recommend that you have screening and Pap tests more often.  If you have a family history of uterine cancer or ovarian cancer, talk with your health care provider about genetic screening.  If you have vaginal bleeding after reaching menopause, tell your health care provider.  There are currently no reliable tests available to screen for ovarian cancer. Lung Cancer Lung cancer screening is recommended for adults 63-80 years old who are at high risk for lung cancer because of a history of smoking. A yearly low-dose CT scan of the lungs is recommended if you:  Currently smoke.  Have a history of at least 30 pack-years of smoking and you currently smoke or have quit within the past 15 years. A pack-year is smoking an average of one pack of cigarettes per day for one year. Yearly screening should:  Continue until it has been 15 years since you quit.  Stop if you develop a health problem that would prevent you from having lung cancer treatment. Colorectal Cancer  This type of cancer can be detected and can often be prevented.  Routine colorectal cancer screening usually begins at age 61 and continues through age 18.  If you have risk factors for colon cancer, your health care provider may recommend that you be screened at an earlier age.  If you have a family history of colorectal cancer, talk with your health care provider about genetic screening.  Your health care provider may also recommend using home test kits to check for hidden blood in your stool.  A small camera at the end of a tube can be used to examine your colon directly (sigmoidoscopy or colonoscopy). This is  done to check for the earliest forms of colorectal cancer.  Direct examination of the colon should be repeated every 5-10 years until age 2. However, if early forms of precancerous polyps or small growths are found or if you have a family history or genetic risk for colorectal cancer, you may need to be screened more often. Skin Cancer  Check your skin from head to toe regularly.  Monitor any moles. Be sure to tell your health care provider:  About any new moles or changes in moles, especially if there is a change in a mole's shape or color.  If you have a mole that is larger than the size of a pencil eraser.  If any of your family members has a history of skin cancer,  especially at a young age, talk with your health care provider about genetic screening.  Always use sunscreen. Apply sunscreen liberally and repeatedly throughout the day.  Whenever you are outside, protect yourself by wearing long sleeves, pants, a wide-brimmed hat, and sunglasses. WHAT SHOULD I KNOW ABOUT OSTEOPOROSIS? Osteoporosis is a condition in which bone destruction happens more quickly than new bone creation. After menopause, you may be at an increased risk for osteoporosis. To help prevent osteoporosis or the bone fractures that can happen because of osteoporosis, the following is recommended:  If you are 34-25 years old, get at least 1,000 mg of calcium and at least 600 mg of vitamin D per day.  If you are older than age 5 but younger than age 75, get at least 1,200 mg of calcium and at least 600 mg of vitamin D per day.  If you are older than age 16, get at least 1,200 mg of calcium and at least 800 mg of vitamin D per day. Smoking and excessive alcohol intake increase the risk of osteoporosis. Eat foods that are rich in calcium and vitamin D, and do weight-bearing exercises several times each week as directed by your health care provider. WHAT SHOULD I KNOW ABOUT HOW MENOPAUSE AFFECTS [place]? Depression may occur at any age, but it is more common as you become older. Common symptoms of depression include:  Low or sad mood.  Changes in sleep patterns.  Changes in appetite or eating patterns.  Feeling an overall lack of motivation or enjoyment of activities that you previously enjoyed.  Frequent crying spells. Talk with your health care provider if you think that you are experiencing depression. WHAT SHOULD I KNOW ABOUT IMMUNIZATIONS? It is important that you get and maintain your immunizations. These include:  Tetanus, diphtheria, and pertussis (Tdap) booster vaccine.  Influenza every year before the flu season begins.  Pneumonia vaccine.  Shingles vaccine. Your health care provider may also recommend other immunizations.   This information is not intended to replace advice given to you by your health care provider. Make sure you discuss any questions you have with your health care provider.   Document Released: 04/27/2005 Document Revised: 03/26/2014 Document Reviewed: 11/05/2013 Elsevier Interactive Patient Education 2016 [employer].    Osteoporosis Osteoporosis is the thinning and loss of density in the bones. Osteoporosis makes the bones more brittle, fragile, and likely to break (fracture). Over time, osteoporosis can cause the bones to become so weak that they fracture after a simple fall. The bones most likely to fracture are the bones in the hip, wrist, and spine. CAUSES  The exact cause is not known. RISK FACTORS Anyone can develop osteoporosis. You may be at greater risk if you have a family history of the condition or have poor nutrition. You may also have a higher risk if you are:   Female.   33 years old or older.  A smoker.  Not physically active.   White or Asian.  Slender. SIGNS AND SYMPTOMS  A fracture might be the first sign of the disease, especially if it results from a fall or injury that would not usually cause a bone to  break. Other signs and symptoms include:   Low back and neck pain.  Stooped posture.  Height loss. DIAGNOSIS  To make a diagnosis, your health care provider may:  Take a medical history.  Perform a physical exam.  Order tests, such as:  A bone mineral density test.  A  dual-energy X-ray absorptiometry test. TREATMENT  The goal of osteoporosis treatment is to strengthen your bones to reduce your risk of a fracture. Treatment may involve:  Making lifestyle changes, such as:  Eating a diet rich in calcium.  Doing weight-bearing and muscle-strengthening exercises.  Stopping tobacco use.  Limiting alcohol intake.  Taking medicine to slow the process of bone loss or to increase bone density.  Monitoring your levels of calcium and vitamin D. HOME CARE INSTRUCTIONS  Include calcium and vitamin D in your diet. Calcium is important for bone health, and vitamin D helps the body absorb calcium.  Perform weight-bearing and muscle-strengthening exercises as directed by your health care provider.  Do not use any tobacco products, including cigarettes, chewing tobacco, and electronic cigarettes. If you need help quitting, ask your health care provider.  Limit your alcohol intake.  Take medicines only as directed by your health care provider.  Keep all follow-up visits as directed by your health care provider. This is important.  Take precautions at home to lower your risk of falling, such as:  Keeping rooms well lit and clutter free.  Installing safety rails on stairs.  Using rubber mats in the bathroom and other areas that are often wet or slippery. SEEK IMMEDIATE MEDICAL CARE IF:  You fall or injure yourself.    This information is not intended to replace advice given to you by your health care provider. Make sure you discuss any questions you have with your health care provider.   Document Released: 12/13/2004 Document Revised: 03/26/2014 Document Reviewed:  08/13/2013 Elsevier Interactive Patient Education [employer].

## 2014-12-22 NOTE — Progress Notes (Addendum)
Subjective:   Stephanie Pham is a 64 y.o. female who presents for Medicare Annual (Subsequent) preventive examination.  Review of Systems:  HRA assessment completed during visit;  The Patient was informed that this wellness visit is to identify risk and educate on how to reduce risk for increase disease through lifestyle changes.   ROS deferred to CPE exam with physician  Family of diabetes and father died with MI at 80  Medical issues HTN, Hyperlipidemia; (trig elevated 318)(A1c 5.8 5/6) medically tx with zocor Osteopenia; vit D and calcium;  Obesity;  Referred to [facility] for MNT   BMI: 40.7 morbid obesity; Related to emotional eating  admits to gaining weight  after son's death; spouse did jog but son death secondary to car accident by member of the family and they both embraced food to assist with coping; approx 26 yo.  Brother expired 11/03/2014 unknown cause  Diet; the patient states she eats well, but loves potato chips etc;  Exercise; goal is not to sit as much; Walking; treadmill and stationary bike at home; 3 miles on bike; work up to 30 minutes 3 to 4 days a week;   SAFETY/ full basement and upstairs; Goes up and down steps with caution.  Falls; has not fallen this year Safety reviewed for the home; including removal of clutter; clear paths through the home, railing as needed; bathroom safety; community safety; smoke detectors and firearms safety as well as sun protection;  Driving accidents/ has not driven  Scientist, research (life sciences) Stressors; loss of brother recently and cause of death unknown; and loss of son 69 yo  Medication review/ New meds: areds2 for eye x 2 daily; for eye disease (OTC)   Mobilization and Functional losses in the last year. None/but harder to bend over Sleep patterns; take a nap during the day; wake up at 4am and gets on the computer    Urinary or fecal incontinence reviewed/some ua incontinence; will discuss with Dr. Ronnald Ramp   Counseling: Hep C-  reviewed and can have this drawn with physical. Colonoscopy; 11/06/2011/ Dr. Carlean Purl / unremarkable/ OB test recently;  (personal hx of colon cancer) EKG; Sometimes she feels "congested" in her chest; can't describe what It feels like but probably due to loss of brother recently and grief  Dexa scan; 11/05/2013 (right femoral neck -1.3) repeat in 2 years/ continues Vit D and calcium and exercise will help Mammogram 11/25/2014 PAP- Dr. 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 Never Smoker   Smokeless tobacco  . Never Used     Counseling given: Yes   Past Medical History  Diagnosis Date  . Chronic depression   . Dyslipidemia   . Internal hemorrhoid   . Diverticulosis of colon   . Hypertension   . Chronic anxiety    Past Surgical History  Procedure Laterality Date  . Laparotomy  and removal of polyp    . Cholecystectomy    . Sigmoid colectomy    . Abdominal hysterectomy    . Cataract extraction      left eye 05-13-2013   Family History  Problem Relation Age of Onset  . Fibromyalgia Mother   . Hypertension Mother   . Heart disease Father   . Diabetes Father   . Cancer Maternal Aunt     Breast cancer  . Colon cancer Neg Hx   . Rectal cancer Neg Hx   . Stomach cancer Neg Hx   . Esophageal cancer Neg Hx    History  Sexual Activity  . Sexual Activity:  Not Currently    Outpatient Encounter Prescriptions as of 12/22/2014  Medication Sig  . acetaminophen (TYLENOL) 325 MG tablet Take 650 mg by mouth every 6 (six) hours as needed.  Marland Kitchen atenolol (TENORMIN) 50 MG tablet Take 1 tablet (50 mg total) by mouth daily.  . clorazepate (TRANXENE) 7.5 MG tablet TAKE 1 TABLET BY MOUTH 4 TIMES A DAY  . fosinopril-hydrochlorothiazide (MONOPRIL-HCT) 20-12.5 MG per tablet Take 1 tablet by mouth daily.  . simvastatin (ZOCOR) 10 MG tablet Take 1 tablet (10 mg total) by mouth at bedtime.   No facility-administered encounter medications on file as of 12/22/2014.    Activities of Daily Living In your present state of health, do you have any difficulty performing the following activities: 12/22/2014 02/19/2014  Hearing? (No Data) N  Vision? Y N  Difficulty concentrating or making decisions? N N  Walking or climbing stairs? Y N  Dressing or bathing? N N  Doing errands, shopping? N N  Preparing Food and eating ? N -  Using the Toilet? N -  In the past six months, have you accidently leaked urine? Y -  Do you have problems with loss of bowel control? N -  Managing your Medications? N -  Managing your Finances? N -  Housekeeping or managing your Housekeeping? N -    Patient Care Team: [name], MD as PCP - General (Internal Medicine)    Assessment:    Assessment   Patient presents for yearly preventative medicine examination. Medicare questionnaire screening were completed, i.e. Functional; fall risk; depression, memory loss and hearing reviewed  All immunizations and health maintenance protocols were reviewed with the patient and took flu shot today and will get Prevnar during CPE  Education provided for laboratory screens;    Medication reconciliation, past medical history, social history, problem list and allergies were reviewed in detail with the patient  Goals were established with regard to weight loss and the patient agreed to exercise and  referral made to [facility];   End of life planning was discussed.   Exercise Activities and Dietary recommendations Current Exercise Habits:: Home exercise routine, Time (Minutes): 20, Frequency (Times/Week): 3, Weekly Exercise (Minutes/Week): 60, Intensity: Mild  Goals    . Exercise 150 minutes per week (moderate activity)     Will start stationary bike riding and build up to 30 min 3 to 4 times per day  Fat free or low fat dairy products Fish high in omega-3 acids ( salmon, tuna, trout) Fruits, such as apples, bananas, oranges, pears, prunes Legumes, such as kidney beans, lentils, checkpeas, black-eyed peas and lima beans Vegetables; broccoli, cabbage, carrots Whole grains;   Plant fats are better; decrease "white" foods as pasta, rice, bread and desserts, sugar; Avoid red meat (limiting) palm and coconut oils; sugary foods and beverages  Two nutrients that raise blood chol levels are saturated fats and trans fat; in hydrogenated oils and fats, as stick margarine, baked goods (cookes, cakes, pies, crackers; frosting; and coffee creamers;   Some Fats lower cholesterol: Monounsaturated and polyunsaturated  Avocados Corn, sunflower, and soybean oils Nuts and seeds, such as walnuts Olive, canola, peanut, safflower, and sesame oils Peanut butter Salmon and trout Tofu         Fall Risk Fall Risk  12/22/2014 02/19/2014  Falls in the past year? No No   Depression Screen PHQ 2/9 Scores 12/22/2014 02/19/2014  PHQ - 2 Score 2 0  PHQ- 9 Score 5 -    Educated on counseling but overall, just sad regarding her brother as well as continued grief over loss of son.  Cognitive Testing No flowsheet data found.  Immunization History  Administered Date(s) Administered  . Influenza,inj,Quad PF,36+ Mos 02/09/2013, 11/17/2013, 12/22/2014  . Tetanus 02/09/2013   Screening Tests Health Maintenance  Topic Date Due  . Hepatitis C Screening  Jul 21, 1950  . HIV Screening  12/10/1964  . PAP  SMEAR  12/11/1970  . ZOSTAVAX  12/10/2009  . INFLUENZA VACCINE  10/18/2014  . PNA vac Low Risk Adult (1 of 2 - PCV13) 12/11/2014  . COLONOSCOPY  11/05/2016  . MAMMOGRAM  11/15/2016  . TETANUS/TDAP  02/10/2023  . DEXA SCAN  Completed      Plan:      Will take prevnar next week;  Will take flu shot today  Will discuss recent loss of brother with Dr. Ronnald Ramp to review for depression; Affect appropriate today; but shares grief; Spouse noted to have more complicated grief and was present at today's session. The patient admits to sadness which comes and goes but feels she can manage it.   Ua incontinent  issues with Dr. Ronnald Ramp  May repeat EKG due to vague chest c/o if indicated; Given S/s of chest pain in women and told to go to ER if these symptoms occurred but states she feels this during stress.   Discussed better "low fat options" as junk foods seems to be an issue.   [facility]erral to [facility]  at [facility]    During the course of the visit the patient was educated and counseled about the following appropriate screening and preventive services:   Vaccines to include Pneumoccal, Influenza, Hepatitis B, Td, Zostavax, HCV  Flu today and prevnar on during CPE  Electrocardiogram - to discuss with Dr. Ronnald Ramp  Cardiovascular Disease - BP good; Reviewed high trig and discussed low fat options; and risk due to obesity  Colorectal cancer screening 10/2011/ OB test recently  Bone density screening /11/2013 will repeat in 2 years  Discussed weight bearing and will start on stationary bike;   Discussed Calcium and Vit d  Diabetes screening 11/05/2013  Glaucoma screening - April 2016 ? Mac degeneration, states he did not mention retinopathy  Mammography/ 11/2014  Nutrition counseling / referred to [facility] at [facility]  Patient Instructions (the written plan) was given to the patient.   [id number],ESPQZ, RN  12/22/2014     Medical screening examination/treatment/procedure(s) were  performed by non-physician practitioner and as supervising physician I was immediately available for consultation/collaboration. I agree with above. Scarlette Calico, MD

## 2014-12-28 ENCOUNTER — Encounter: Payer: Self-pay | Admitting: Internal Medicine

## 2014-12-28 ENCOUNTER — Ambulatory Visit (INDEPENDENT_AMBULATORY_CARE_PROVIDER_SITE_OTHER): Payer: Medicare Other | Admitting: Internal Medicine

## 2014-12-28 VITALS — BP 118/78 | HR 61 | Temp 98.2°F | Ht 64.0 in | Wt 238.0 lb

## 2014-12-28 DIAGNOSIS — Z23 Encounter for immunization: Secondary | ICD-10-CM | POA: Diagnosis not present

## 2014-12-28 DIAGNOSIS — E785 Hyperlipidemia, unspecified: Secondary | ICD-10-CM

## 2014-12-28 DIAGNOSIS — Z0000 Encounter for general adult medical examination without abnormal findings: Secondary | ICD-10-CM

## 2014-12-28 DIAGNOSIS — F411 Generalized anxiety disorder: Secondary | ICD-10-CM | POA: Diagnosis not present

## 2014-12-28 DIAGNOSIS — R739 Hyperglycemia, unspecified: Secondary | ICD-10-CM

## 2014-12-28 DIAGNOSIS — M810 Age-related osteoporosis without current pathological fracture: Secondary | ICD-10-CM

## 2014-12-28 DIAGNOSIS — I10 Essential (primary) hypertension: Secondary | ICD-10-CM

## 2014-12-28 MED ORDER — ATENOLOL 50 MG PO TABS: 50.0000 mg | ORAL_TABLET | Freq: Every day | ORAL | Status: DC

## 2014-12-28 MED ORDER — CLORAZEPATE DIPOTASSIUM 7.5 MG PO TABS: ORAL_TABLET | ORAL | Status: DC

## 2014-12-28 NOTE — Patient Instructions (Signed)
Preventive Care for Adults, Female A healthy lifestyle and preventive care can promote health and wellness. Preventive health guidelines for women include the following key practices.  A routine yearly physical is a good way to check with your health care provider about your health and preventive screening. It is a chance to share any concerns and updates on your health and to receive a thorough exam.  Visit your dentist for a routine exam and preventive care every 6 months. Brush your teeth twice a day and floss once a day. Good oral hygiene prevents tooth decay and gum disease.  The frequency of eye exams is based on your age, health, family medical history, use of contact lenses, and other factors. Follow your health care provider's recommendations for frequency of eye exams.  Eat a healthy diet. Foods like vegetables, fruits, whole grains, low-fat dairy products, and lean protein foods contain the nutrients you need without too many calories. Decrease your intake of foods high in solid fats, added sugars, and salt. Eat the right amount of calories for you.Get information about a proper diet from your health care provider, if necessary.  Regular physical exercise is one of the most important things you can do for your health. Most adults should get at least 150 minutes of moderate-intensity exercise (any activity that increases your heart rate and causes you to sweat) each week. In addition, most adults need muscle-strengthening exercises on 2 or more days a week.  Maintain a healthy weight. The body mass index (BMI) is a screening tool to identify possible weight problems. It provides an estimate of body fat based on height and weight. Your health care provider can find your BMI and can help you achieve or maintain a healthy weight.For adults 20 years and older:  A BMI below 18.5 is considered underweight.  A BMI of 18.5 to 24.9 is normal.  A BMI of 25 to 29.9 is considered overweight.  A  BMI of 30 and above is considered obese.  Maintain normal blood lipids and cholesterol levels by exercising and minimizing your intake of saturated fat. Eat a balanced diet with plenty of fruit and vegetables. Blood tests for lipids and cholesterol should begin at age 45 and be repeated every 5 years. If your lipid or cholesterol levels are high, you are over 50, or you are at high risk for heart disease, you may need your cholesterol levels checked more frequently.Ongoing high lipid and cholesterol levels should be treated with medicines if diet and exercise are not working.  If you smoke, find out from your health care provider how to quit. If you do not use tobacco, do not start.  Lung cancer screening is recommended for adults aged 45-80 years who are at high risk for developing lung cancer because of a history of smoking. A yearly low-dose CT scan of the lungs is recommended for people who have at least a 30-pack-year history of smoking and are a current smoker or have quit within the past 15 years. A pack year of smoking is smoking an average of 1 pack of cigarettes a day for 1 year (for example: 1 pack a day for 30 years or 2 packs a day for 15 years). Yearly screening should continue until the smoker has stopped smoking for at least 15 years. Yearly screening should be stopped for people who develop a health problem that would prevent them from having lung cancer treatment.  If you are pregnant, do not drink alcohol. If you are  breastfeeding, be very cautious about drinking alcohol. If you are not pregnant and choose to drink alcohol, do not have more than 1 drink per day. One drink is considered to be 12 ounces (355 mL) of beer, 5 ounces (148 mL) of wine, or 1.5 ounces (44 mL) of liquor.  Avoid use of street drugs. Do not share needles with anyone. Ask for help if you need support or instructions about stopping the use of drugs.  High blood pressure causes heart disease and increases the risk  of stroke. Your blood pressure should be checked at least every 1 to 2 years. Ongoing high blood pressure should be treated with medicines if weight loss and exercise do not work.  If you are 55-79 years old, ask your health care provider if you should take aspirin to prevent strokes.  Diabetes screening is done by taking a blood sample to check your blood glucose level after you have not eaten for a certain period of time (fasting). If you are not overweight and you do not have risk factors for diabetes, you should be screened once every 3 years starting at age 45. If you are overweight or obese and you are 40-70 years of age, you should be screened for diabetes every year as part of your cardiovascular risk assessment.  Breast cancer screening is essential preventive care for women. You should practice "breast self-awareness." This means understanding the normal appearance and feel of your breasts and may include breast self-examination. Any changes detected, no matter how small, should be reported to a health care provider. Women in their 20s and 30s should have a clinical breast exam (CBE) by a health care provider as part of a regular health exam every 1 to 3 years. After age 40, women should have a CBE every year. Starting at age 40, women should consider having a mammogram (breast X-ray test) every year. Women who have a family history of breast cancer should talk to their health care provider about genetic screening. Women at a high risk of breast cancer should talk to their health care providers about having an MRI and a mammogram every year.  Breast cancer gene (BRCA)-related cancer risk assessment is recommended for women who have family members with BRCA-related cancers. BRCA-related cancers include breast, ovarian, tubal, and peritoneal cancers. Having family members with these cancers may be associated with an increased risk for harmful changes (mutations) in the breast cancer genes BRCA1 and  BRCA2. Results of the assessment will determine the need for genetic counseling and BRCA1 and BRCA2 testing.  Your health care provider may recommend that you be screened regularly for cancer of the pelvic organs (ovaries, uterus, and vagina). This screening involves a pelvic examination, including checking for microscopic changes to the surface of your cervix (Pap test). You may be encouraged to have this screening done every 3 years, beginning at age 21.  For women ages 30-65, health care providers may recommend pelvic exams and Pap testing every 3 years, or they may recommend the Pap and pelvic exam, combined with testing for human papilloma virus (HPV), every 5 years. Some types of HPV increase your risk of cervical cancer. Testing for HPV may also be done on women of any age with unclear Pap test results.  Other health care providers may not recommend any screening for nonpregnant women who are considered low risk for pelvic cancer and who do not have symptoms. Ask your health care provider if a screening pelvic exam is right for   you.  If you have had past treatment for cervical cancer or a condition that could lead to cancer, you need Pap tests and screening for cancer for at least 20 years after your treatment. If Pap tests have been discontinued, your risk factors (such as having a new sexual partner) need to be reassessed to determine if screening should resume. Some women have medical problems that increase the chance of getting cervical cancer. In these cases, your health care provider may recommend more frequent screening and Pap tests.  Colorectal cancer can be detected and often prevented. Most routine colorectal cancer screening begins at the age of 50 years and continues through age 75 years. However, your health care provider may recommend screening at an earlier age if you have risk factors for colon cancer. On a yearly basis, your health care provider may provide home test kits to check  for hidden blood in the stool. Use of a small camera at the end of a tube, to directly examine the colon (sigmoidoscopy or colonoscopy), can detect the earliest forms of colorectal cancer. Talk to your health care provider about this at age 50, when routine screening begins. Direct exam of the colon should be repeated every 5-10 years through age 75 years, unless early forms of precancerous polyps or small growths are found.  People who are at an increased risk for hepatitis B should be screened for this virus. You are considered at high risk for hepatitis B if:  You were born in a country where hepatitis B occurs often. Talk with your health care provider about which countries are considered high risk.  Your parents were born in a high-risk country and you have not received a shot to protect against hepatitis B (hepatitis B vaccine).  You have HIV or AIDS.  You use needles to inject street drugs.  You live with, or have sex with, someone who has hepatitis B.  You get hemodialysis treatment.  You take certain medicines for conditions like cancer, organ transplantation, and autoimmune conditions.  Hepatitis C blood testing is recommended for all people born from 1945 through 1965 and any individual with known risks for hepatitis C.  Practice safe sex. Use condoms and avoid high-risk sexual practices to reduce the spread of sexually transmitted infections (STIs). STIs include gonorrhea, chlamydia, syphilis, trichomonas, herpes, HPV, and human immunodeficiency virus (HIV). Herpes, HIV, and HPV are viral illnesses that have no cure. They can result in disability, cancer, and death.  You should be screened for sexually transmitted illnesses (STIs) including gonorrhea and chlamydia if:  You are sexually active and are younger than 24 years.  You are older than 24 years and your health care provider tells you that you are at risk for this type of infection.  Your sexual activity has changed  since you were last screened and you are at an increased risk for chlamydia or gonorrhea. Ask your health care provider if you are at risk.  If you are at risk of being infected with HIV, it is recommended that you take a prescription medicine daily to prevent HIV infection. This is called preexposure prophylaxis (PrEP). You are considered at risk if:  You are sexually active and do not regularly use condoms or know the HIV status of your partner(s).  You take drugs by injection.  You are sexually active with a partner who has HIV.  Talk with your health care provider about whether you are at high risk of being infected with HIV. If   you choose to begin PrEP, you should first be tested for HIV. You should then be tested every 3 months for as long as you are taking PrEP.  Osteoporosis is a disease in which the bones lose minerals and strength with aging. This can result in serious bone fractures or breaks. The risk of osteoporosis can be identified using a bone density scan. Women ages 67 years and over and women at risk for fractures or osteoporosis should discuss screening with their health care providers. Ask your health care provider whether you should take a calcium supplement or vitamin D to reduce the rate of osteoporosis.  Menopause can be associated with physical symptoms and risks. Hormone replacement therapy is available to decrease symptoms and risks. You should talk to your health care provider about whether hormone replacement therapy is right for you.  Use sunscreen. Apply sunscreen liberally and repeatedly throughout the day. You should seek shade when your shadow is shorter than you. Protect yourself by wearing long sleeves, pants, a wide-brimmed hat, and sunglasses year round, whenever you are outdoors.  Once a month, do a whole body skin exam, using a mirror to look at the skin on your back. Tell your health care provider of new moles, moles that have irregular borders, moles that  are larger than a pencil eraser, or moles that have changed in shape or color.  Stay current with required vaccines (immunizations).  Influenza vaccine. All adults should be immunized every year.  Tetanus, diphtheria, and acellular pertussis (Td, Tdap) vaccine. Pregnant women should receive 1 dose of Tdap vaccine during each pregnancy. The dose should be obtained regardless of the length of time since the last dose. Immunization is preferred during the 27th-36th week of gestation. An adult who has not previously received Tdap or who does not know her vaccine status should receive 1 dose of Tdap. This initial dose should be followed by tetanus and diphtheria toxoids (Td) booster doses every 10 years. Adults with an unknown or incomplete history of completing a 3-dose immunization series with Td-containing vaccines should begin or complete a primary immunization series including a Tdap dose. Adults should receive a Td booster every 10 years.  Varicella vaccine. An adult without evidence of immunity to varicella should receive 2 doses or a second dose if she has previously received 1 dose. Pregnant females who do not have evidence of immunity should receive the first dose after pregnancy. This first dose should be obtained before leaving the health care facility. The second dose should be obtained 4-8 weeks after the first dose.  Human papillomavirus (HPV) vaccine. Females aged 13-26 years who have not received the vaccine previously should obtain the 3-dose series. The vaccine is not recommended for use in pregnant females. However, pregnancy testing is not needed before receiving a dose. If a female is found to be pregnant after receiving a dose, no treatment is needed. In that case, the remaining doses should be delayed until after the pregnancy. Immunization is recommended for any person with an immunocompromised condition through the age of 61 years if she did not get any or all doses earlier. During the  3-dose series, the second dose should be obtained 4-8 weeks after the first dose. The third dose should be obtained 24 weeks after the first dose and 16 weeks after the second dose.  Zoster vaccine. One dose is recommended for adults aged 30 years or older unless certain conditions are present.  Measles, mumps, and rubella (MMR) vaccine. Adults born  before 1957 generally are considered immune to measles and mumps. Adults born in 1957 or later should have 1 or more doses of MMR vaccine unless there is a contraindication to the vaccine or there is laboratory evidence of immunity to each of the three diseases. A routine second dose of MMR vaccine should be obtained at least 28 days after the first dose for students attending postsecondary schools, health care workers, or international travelers. People who received inactivated measles vaccine or an unknown type of measles vaccine during 1963-1967 should receive 2 doses of MMR vaccine. People who received inactivated mumps vaccine or an unknown type of mumps vaccine before 1979 and are at high risk for mumps infection should consider immunization with 2 doses of MMR vaccine. For females of childbearing age, rubella immunity should be determined. If there is no evidence of immunity, females who are not pregnant should be vaccinated. If there is no evidence of immunity, females who are pregnant should delay immunization until after pregnancy. Unvaccinated health care workers born before 1957 who lack laboratory evidence of measles, mumps, or rubella immunity or laboratory confirmation of disease should consider measles and mumps immunization with 2 doses of MMR vaccine or rubella immunization with 1 dose of MMR vaccine.  Pneumococcal 13-valent conjugate (PCV13) vaccine. When indicated, a person who is uncertain of his immunization history and has no record of immunization should receive the PCV13 vaccine. All adults 65 years of age and older should receive this  vaccine. An adult aged 19 years or older who has certain medical conditions and has not been previously immunized should receive 1 dose of PCV13 vaccine. This PCV13 should be followed with a dose of pneumococcal polysaccharide (PPSV23) vaccine. Adults who are at high risk for pneumococcal disease should obtain the PPSV23 vaccine at least 8 weeks after the dose of PCV13 vaccine. Adults older than 65 years of age who have normal immune system function should obtain the PPSV23 vaccine dose at least 1 year after the dose of PCV13 vaccine.  Pneumococcal polysaccharide (PPSV23) vaccine. When PCV13 is also indicated, PCV13 should be obtained first. All adults aged 65 years and older should be immunized. An adult younger than age 65 years who has certain medical conditions should be immunized. Any person who resides in a nursing home or long-term care facility should be immunized. An adult smoker should be immunized. People with an immunocompromised condition and certain other conditions should receive both PCV13 and PPSV23 vaccines. People with human immunodeficiency virus (HIV) infection should be immunized as soon as possible after diagnosis. Immunization during chemotherapy or radiation therapy should be avoided. Routine use of PPSV23 vaccine is not recommended for American Indians, [place] Natives, or people younger than 65 years unless there are medical conditions that require PPSV23 vaccine. When indicated, people who have unknown immunization and have no record of immunization should receive PPSV23 vaccine. One-time revaccination 5 years after the first dose of PPSV23 is recommended for people aged 19-64 years who have chronic kidney failure, nephrotic syndrome, asplenia, or immunocompromised conditions. People who received 1-2 doses of PPSV23 before age 65 years should receive another dose of PPSV23 vaccine at age 65 years or later if at least 5 years have passed since the previous dose. Doses of PPSV23 are not  needed for people immunized with PPSV23 at or after age 65 years.  Meningococcal vaccine. Adults with asplenia or persistent complement component deficiencies should receive 2 doses of quadrivalent meningococcal conjugate (MenACWY-D) vaccine. The doses should be obtained   at least 2 months apart. Microbiologists working with certain meningococcal bacteria, [place] recruits, people at risk during an outbreak, and people who travel to or live in countries with a high rate of meningitis should be immunized. A first-year college student up through age 34 years who is living in a residence hall should receive a dose if she did not receive a dose on or after her 16th birthday. Adults who have certain high-risk conditions should receive one or more doses of vaccine.  Hepatitis A vaccine. Adults who wish to be protected from this disease, have certain high-risk conditions, work with hepatitis A-infected animals, work in hepatitis A research labs, or travel to or work in countries with a high rate of hepatitis A should be immunized. Adults who were previously unvaccinated and who anticipate close contact with an international adoptee during the first 60 days after arrival in the [place] from a country with a high rate of hepatitis A should be immunized.  Hepatitis B vaccine. Adults who wish to be protected from this disease, have certain high-risk conditions, may be exposed to blood or other infectious body fluids, are household contacts or sex partners of hepatitis B positive people, are clients or workers in certain care facilities, or travel to or work in countries with a high rate of hepatitis B should be immunized.  Haemophilus influenzae type b (Hib) vaccine. A previously unvaccinated person with asplenia or sickle cell disease or having a scheduled splenectomy should receive 1 dose of Hib vaccine. Regardless of previous immunization, a recipient of a hematopoietic stem cell transplant should receive a  3-dose series 6-12 months after her successful transplant. Hib vaccine is not recommended for adults with HIV infection. Preventive Services / Frequency Ages 35 to 4 years  Blood pressure check.** / Every 3-5 years.  Lipid and cholesterol check.** / Every 5 years beginning at age 60.  Clinical breast exam.** / Every 3 years for women in their 71s and 10s.  BRCA-related cancer risk assessment.** / For women who have family members with a BRCA-related cancer (breast, ovarian, tubal, or peritoneal cancers).  Pap test.** / Every 2 years from ages 76 through 26. Every 3 years starting at age 61 through age 76 or 93 with a history of 3 consecutive normal Pap tests.  HPV screening.** / Every 3 years from ages 37 through ages 60 to 51 with a history of 3 consecutive normal Pap tests.  Hepatitis C blood test.** / For any individual with known risks for hepatitis C.  Skin self-exam. / Monthly.  Influenza vaccine. / Every year.  Tetanus, diphtheria, and acellular pertussis (Tdap, Td) vaccine.** / Consult your health care provider. Pregnant women should receive 1 dose of Tdap vaccine during each pregnancy. 1 dose of Td every 10 years.  Varicella vaccine.** / Consult your health care provider. Pregnant females who do not have evidence of immunity should receive the first dose after pregnancy.  HPV vaccine. / 3 doses over 6 months, if 93 and younger. The vaccine is not recommended for use in pregnant females. However, pregnancy testing is not needed before receiving a dose.  Measles, mumps, rubella (MMR) vaccine.** / You need at least 1 dose of MMR if you were born in 1957 or later. You may also need a 2nd dose. For females of childbearing age, rubella immunity should be determined. If there is no evidence of immunity, females who are not pregnant should be vaccinated. If there is no evidence of immunity, females who are  pregnant should delay immunization until after pregnancy.  Pneumococcal  13-valent conjugate (PCV13) vaccine.** / Consult your health care provider.  Pneumococcal polysaccharide (PPSV23) vaccine.** / 1 to 2 doses if you smoke cigarettes or if you have certain conditions.  Meningococcal vaccine.** / 1 dose if you are age 68 to 8 years and a Market researcher living in a residence hall, or have one of several medical conditions, you need to get vaccinated against meningococcal disease. You may also need additional booster doses.  Hepatitis A vaccine.** / Consult your health care provider.  Hepatitis B vaccine.** / Consult your health care provider.  Haemophilus influenzae type b (Hib) vaccine.** / Consult your health care provider. Ages 7 to 53 years  Blood pressure check.** / Every year.  Lipid and cholesterol check.** / Every 5 years beginning at age 25 years.  Lung cancer screening. / Every year if you are aged 11-80 years and have a 30-pack-year history of smoking and currently smoke or have quit within the past 15 years. Yearly screening is stopped once you have quit smoking for at least 15 years or develop a health problem that would prevent you from having lung cancer treatment.  Clinical breast exam.** / Every year after age 48 years.  BRCA-related cancer risk assessment.** / For women who have family members with a BRCA-related cancer (breast, ovarian, tubal, or peritoneal cancers).  Mammogram.** / Every year beginning at age 41 years and continuing for as long as you are in good health. Consult with your health care provider.  Pap test.** / Every 3 years starting at age 65 years through age 37 or 70 years with a history of 3 consecutive normal Pap tests.  HPV screening.** / Every 3 years from ages 72 years through ages 60 to 40 years with a history of 3 consecutive normal Pap tests.  Fecal occult blood test (FOBT) of stool. / Every year beginning at age 21 years and continuing until age 5 years. You may not need to do this test if you get  a colonoscopy every 10 years.  Flexible sigmoidoscopy or colonoscopy.** / Every 5 years for a flexible sigmoidoscopy or every 10 years for a colonoscopy beginning at age 35 years and continuing until age 48 years.  Hepatitis C blood test.** / For all people born from [birth year] through 1965 and any individual with known risks for hepatitis C.  Skin self-exam. / Monthly.  Influenza vaccine. / Every year.  Tetanus, diphtheria, and acellular pertussis (Tdap/Td) vaccine.** / Consult your health care provider. Pregnant women should receive 1 dose of Tdap vaccine during each pregnancy. 1 dose of Td every 10 years.  Varicella vaccine.** / Consult your health care provider. Pregnant females who do not have evidence of immunity should receive the first dose after pregnancy.  Zoster vaccine.** / 1 dose for adults aged 30 years or older.  Measles, mumps, rubella (MMR) vaccine.** / You need at least 1 dose of MMR if you were born in 1957 or later. You may also need a second dose. For females of childbearing age, rubella immunity should be determined. If there is no evidence of immunity, females who are not pregnant should be vaccinated. If there is no evidence of immunity, females who are pregnant should delay immunization until after pregnancy.  Pneumococcal 13-valent conjugate (PCV13) vaccine.** / Consult your health care provider.  Pneumococcal polysaccharide (PPSV23) vaccine.** / 1 to 2 doses if you smoke cigarettes or if you have certain conditions.  Meningococcal vaccine.** /  Consult your health care provider.  Hepatitis A vaccine.** / Consult your health care provider.  Hepatitis B vaccine.** / Consult your health care provider.  Haemophilus influenzae type b (Hib) vaccine.** / Consult your health care provider. Ages 64 years and over  Blood pressure check.** / Every year.  Lipid and cholesterol check.** / Every 5 years beginning at age 23 years.  Lung cancer screening. / Every year if you  are aged 16-80 years and have a 30-pack-year history of smoking and currently smoke or have quit within the past 15 years. Yearly screening is stopped once you have quit smoking for at least 15 years or develop a health problem that would prevent you from having lung cancer treatment.  Clinical breast exam.** / Every year after age 74 years.  BRCA-related cancer risk assessment.** / For women who have family members with a BRCA-related cancer (breast, ovarian, tubal, or peritoneal cancers).  Mammogram.** / Every year beginning at age 44 years and continuing for as long as you are in good health. Consult with your health care provider.  Pap test.** / Every 3 years starting at age 58 years through age 22 or 39 years with 3 consecutive normal Pap tests. Testing can be stopped between 65 and 70 years with 3 consecutive normal Pap tests and no abnormal Pap or HPV tests in the past 10 years.  HPV screening.** / Every 3 years from ages 64 years through ages 70 or 61 years with a history of 3 consecutive normal Pap tests. Testing can be stopped between 65 and 70 years with 3 consecutive normal Pap tests and no abnormal Pap or HPV tests in the past 10 years.  Fecal occult blood test (FOBT) of stool. / Every year beginning at age 40 years and continuing until age 27 years. You may not need to do this test if you get a colonoscopy every 10 years.  Flexible sigmoidoscopy or colonoscopy.** / Every 5 years for a flexible sigmoidoscopy or every 10 years for a colonoscopy beginning at age 7 years and continuing until age 32 years.  Hepatitis C blood test.** / For all people born from [birth year] through 1966 and any individual with known risks for hepatitis C.  Osteoporosis screening.** / A one-time screening for women ages 30 years and over and women at risk for fractures or osteoporosis.  Skin self-exam. / Monthly.  Influenza vaccine. / Every year.  Tetanus, diphtheria, and acellular pertussis (Tdap/Td)  vaccine.** / 1 dose of Td every 10 years.  Varicella vaccine.** / Consult your health care provider.  Zoster vaccine.** / 1 dose for adults aged 35 years or older.  Pneumococcal 13-valent conjugate (PCV13) vaccine.** / Consult your health care provider.  Pneumococcal polysaccharide (PPSV23) vaccine.** / 1 dose for all adults aged 46 years and older.  Meningococcal vaccine.** / Consult your health care provider.  Hepatitis A vaccine.** / Consult your health care provider.  Hepatitis B vaccine.** / Consult your health care provider.  Haemophilus influenzae type b (Hib) vaccine.** / Consult your health care provider. ** Family history and personal history of risk and conditions may change your health care provider's recommendations.   This information is not intended to replace advice given to you by your health care provider. Make sure you discuss any questions you have with your health care provider.   Document Released: 05/01/2001 Document Revised: 03/26/2014 Document Reviewed: 07/31/2010 Elsevier Interactive Patient Education [employer].

## 2014-12-28 NOTE — Progress Notes (Signed)
Subjective:  Patient ID: Stephanie Pham, female    DOB: 03/21/1950  Age: 64 y.o. MRN: [id number]  CC: Hyperlipidemia; Hypertension; and Annual Exam   HPI Stephanie Pham presents for a CPX and f/up on HTN and high cholesterol levels. She complains of persistent episodes of anxiety and nervousness and wants a refill on tranxene.  Outpatient Prescriptions Prior to Visit  Medication Sig Dispense Refill  . acetaminophen (TYLENOL) 325 MG tablet Take 650 mg by mouth every 6 (six) hours as needed.    . fosinopril-hydrochlorothiazide (MONOPRIL-HCT) 20-12.5 MG per tablet Take 1 tablet by mouth daily. 90 tablet 3  . simvastatin (ZOCOR) 10 MG tablet Take 1 tablet (10 mg total) by mouth at bedtime. 90 tablet 3  . atenolol (TENORMIN) 50 MG tablet Take 1 tablet (50 mg total) by mouth daily. 90 tablet 3  . clorazepate (TRANXENE) 7.5 MG tablet TAKE 1 TABLET BY MOUTH 4 TIMES A DAY 360 tablet 3   No facility-administered medications prior to visit.    ROS Review of Systems  Constitutional: Negative.  Negative for fever, chills, diaphoresis, appetite change and fatigue.  HENT: Negative.   Eyes: Negative.   Respiratory: Negative.  Negative for cough, choking, chest tightness, shortness of breath and stridor.   Cardiovascular: Negative.  Negative for chest pain, palpitations and leg swelling.  Gastrointestinal: Negative.  Negative for nausea, vomiting, abdominal pain, diarrhea, constipation and blood in stool.  Endocrine: Negative.   Genitourinary: Negative.  Negative for dysuria, urgency, hematuria and difficulty urinating.  Musculoskeletal: Negative.  Negative for myalgias, back pain, arthralgias and neck pain.  Skin: Negative.  Negative for rash.  Allergic/Immunologic: Negative.   Neurological: Negative.  Negative for dizziness, tremors, weakness, light-headedness, numbness and headaches.  Hematological: Negative.  Negative for adenopathy. Does not bruise/bleed easily.  Psychiatric/Behavioral:  Positive for sleep disturbance. Negative for suicidal ideas, hallucinations, behavioral problems, confusion, self-injury, dysphoric mood, decreased concentration and agitation. The patient is nervous/anxious. The patient is not hyperactive.     Objective:  BP 118/78 mmHg  Pulse 61  Temp(Src) 98.2 F (36.8 C) (Oral)  Ht 5\' 4"  (1.626 m)  Wt 238 lb (107.956 kg)  BMI 40.83 kg/m2  SpO2 96%  BP Readings from Last 3 Encounters:  12/28/14 118/78  12/22/14 118/80  06/21/14 114/74    Wt Readings from Last 3 Encounters:  12/28/14 238 lb (107.956 kg)  12/22/14 237 lb 12 oz (107.843 kg)  06/21/14 234 lb (106.142 kg)    Physical Exam  Constitutional: She is oriented to person, place, and time. She appears well-developed and well-nourished. No distress.  HENT:  Head: Normocephalic and atraumatic.  Mouth/Throat: Oropharynx is clear and moist. No oropharyngeal exudate.  Eyes: Conjunctivae are normal. Right eye exhibits no discharge. Left eye exhibits no discharge. No scleral icterus.  Neck: Normal range of motion. Neck supple. No JVD present. No tracheal deviation present. No thyromegaly present.  Cardiovascular: Normal rate, regular rhythm, normal heart sounds and intact distal pulses.  Exam reveals no gallop and no friction rub.   No murmur heard. Pulmonary/Chest: Effort normal and breath sounds normal. No stridor. No respiratory distress. She has no wheezes. She has no rales. She exhibits no tenderness.  Abdominal: Soft. Bowel sounds are normal. She exhibits no distension and no mass. There is no tenderness. There is no rebound and no guarding.  Genitourinary: No breast swelling, tenderness, discharge or bleeding. Pelvic exam was performed with patient supine.  GU and rectal exams were deferred at her  request since she sees a GYN (Dr. Deatra Ina) annually.  Musculoskeletal: Normal range of motion. She exhibits no edema or tenderness.  Lymphadenopathy:    She has no cervical adenopathy.    Neurological: She is oriented to person, place, and time.  Skin: Skin is warm and dry. No rash noted. She is not diaphoretic. No erythema. No pallor.  Psychiatric: She has a normal mood and affect. Her behavior is normal. Judgment and thought content normal.  Vitals reviewed.   Lab Results  Component Value Date   WBC 5.1 05/25/2010   HGB 13.5 05/25/2010   HCT 38.8 05/25/2010   PLT 265.0 05/25/2010   GLUCOSE 85 02/05/2014   CHOL 209* 02/05/2014   TRIG 318.0* 02/05/2014   HDL 47.10 02/05/2014   LDLDIRECT 111.2 02/05/2014   ALT 37* 02/05/2014   AST 35 02/05/2014   NA 140 02/05/2014   K 3.7 02/05/2014   CL 101 02/05/2014   CREATININE 0.8 02/05/2014   BUN 14 02/05/2014   CO2 27 02/05/2014   HGBA1C 5.8 07/22/2013    No results found.  Assessment & Plan:   Deangela was seen today for hyperlipidemia, hypertension and annual exam.  Diagnoses and all orders for this visit:  Essential hypertension- her BP is well controlled, I will monitor her lytes and renal function -     Comprehensive metabolic panel; Future -     CBC with Differential/Platelet; Future -     Urinalysis, Routine w reflex microscopic (not at [facility]); Future -     atenolol (TENORMIN) 50 MG tablet; Take 1 tablet (50 mg total) by mouth daily.  Hyperglycemia- she has a hx of prediabetes, will recheck her A1C to see if she has developed DM2 -     Comprehensive metabolic panel; Future -     Hemoglobin A1c; Future  Hyperlipidemia with target LDL less than 130- will recheck her FLP to see if she has achieved her LDL goal, she is doing well on the statin -     Lipid panel; Future -     Comprehensive metabolic panel; Future -     TSH; Future  Senile osteoporosis- will check her VIt D level and will treat if indicated -     Vit D  25 hydroxy (rtn osteoporosis monitoring); Future  GAD (generalized anxiety disorder) -     clorazepate (TRANXENE) 7.5 MG tablet; TAKE 1 TABLET BY MOUTH 4 TIMES A DAY  Need for vaccination  with 13-polyvalent pneumococcal conjugate vaccine -     Pneumococcal conjugate vaccine 13-valent  Other orders -     Cancel: atenolol (TENORMIN) 50 MG tablet; Take 1 tablet (50 mg total) by mouth daily. -     Cancel: clorazepate (TRANXENE) 7.5 MG tablet; TAKE 1 TABLET BY MOUTH 4 TIMES A DAY  I am having Ms. Karle maintain her acetaminophen, fosinopril-hydrochlorothiazide, simvastatin, ICAPS AREDS 2, atenolol, and clorazepate.  Meds ordered this encounter  Medications  . Multiple Vitamins-Minerals (ICAPS AREDS 2) CAPS    Sig: Take by mouth 2 (two) times daily.  Marland Kitchen atenolol (TENORMIN) 50 MG tablet    Sig: Take 1 tablet (50 mg total) by mouth daily.    Dispense:  90 tablet    Refill:  3  . clorazepate (TRANXENE) 7.5 MG tablet    Sig: TAKE 1 TABLET BY MOUTH 4 TIMES A DAY    Dispense:  360 tablet    Refill:  3     Follow-up: Return in about 6 months (around 06/28/2015).  Scarlette Calico, MD

## 2014-12-28 NOTE — Progress Notes (Signed)
Pre visit review using our clinic review tool, if applicable. No additional management support is needed unless otherwise documented below in the visit note. 

## 2014-12-28 NOTE — Assessment & Plan Note (Signed)

## 2015-01-04 ENCOUNTER — Other Ambulatory Visit: Payer: Self-pay | Admitting: Internal Medicine

## 2015-02-22 ENCOUNTER — Other Ambulatory Visit: Payer: Self-pay | Admitting: Internal Medicine

## 2015-04-05 DIAGNOSIS — H903 Sensorineural hearing loss, bilateral: Secondary | ICD-10-CM | POA: Diagnosis not present

## 2015-06-27 ENCOUNTER — Other Ambulatory Visit: Payer: Self-pay | Admitting: Internal Medicine

## 2015-06-27 ENCOUNTER — Encounter: Payer: Self-pay | Admitting: Internal Medicine

## 2015-06-27 ENCOUNTER — Other Ambulatory Visit (INDEPENDENT_AMBULATORY_CARE_PROVIDER_SITE_OTHER): Payer: Medicare Other

## 2015-06-27 DIAGNOSIS — I10 Essential (primary) hypertension: Secondary | ICD-10-CM | POA: Diagnosis not present

## 2015-06-27 DIAGNOSIS — E785 Hyperlipidemia, unspecified: Secondary | ICD-10-CM | POA: Diagnosis not present

## 2015-06-27 DIAGNOSIS — M810 Age-related osteoporosis without current pathological fracture: Secondary | ICD-10-CM

## 2015-06-27 DIAGNOSIS — R739 Hyperglycemia, unspecified: Secondary | ICD-10-CM | POA: Diagnosis not present

## 2015-06-27 DIAGNOSIS — E559 Vitamin D deficiency, unspecified: Secondary | ICD-10-CM | POA: Insufficient documentation

## 2015-06-27 LAB — URINALYSIS, ROUTINE W REFLEX MICROSCOPIC
BILIRUBIN URINE: NEGATIVE
HGB URINE DIPSTICK: NEGATIVE
KETONES UR: NEGATIVE
Nitrite: NEGATIVE
RBC / HPF: NONE SEEN (ref 0–?)
Specific Gravity, Urine: >=1.03 — AB (ref 1.000–1.030)
Total Protein, Urine: 30 — AB
URINE GLUCOSE: NEGATIVE
UROBILINOGEN UA: 0.2 (ref 0.0–1.0)
pH: 5.5 (ref 5.0–8.0)

## 2015-06-27 LAB — CBC WITH DIFFERENTIAL/PLATELET
BASOS ABS: 0 10*3/uL (ref 0.0–0.1)
Basophils Relative: 0.4 % (ref 0.0–3.0)
EOS ABS: 0.1 10*3/uL (ref 0.0–0.7)
Eosinophils Relative: 1.9 % (ref 0.0–5.0)
HCT: 38.5 % (ref 36.0–46.0)
Hemoglobin: 13 g/dL (ref 12.0–15.0)
LYMPHS ABS: 1.4 10*3/uL (ref 0.7–4.0)
LYMPHS PCT: 24 % (ref 12.0–46.0)
MCHC: 33.8 g/dL (ref 30.0–36.0)
MCV: 91.5 fl (ref 78.0–100.0)
MONOS PCT: 6.6 % (ref 3.0–12.0)
Monocytes Absolute: 0.4 10*3/uL (ref 0.1–1.0)
NEUTROS PCT: 67.1 % (ref 43.0–77.0)
Neutro Abs: 3.9 10*3/uL (ref 1.4–7.7)
Platelets: 257 10*3/uL (ref 150.0–400.0)
RBC: 4.21 Mil/uL (ref 3.87–5.11)
RDW: 13.4 % (ref 11.5–15.5)
WBC: 5.7 10*3/uL (ref 4.0–10.5)

## 2015-06-27 LAB — COMPREHENSIVE METABOLIC PANEL
ALK PHOS: 100 U/L (ref 39–117)
ALT: 24 U/L (ref 0–35)
AST: 23 U/L (ref 0–37)
Albumin: 4.6 g/dL (ref 3.5–5.2)
BILIRUBIN TOTAL: 0.5 mg/dL (ref 0.2–1.2)
BUN: 14 mg/dL (ref 6–23)
CALCIUM: 9.8 mg/dL (ref 8.4–10.5)
CO2: 30 mEq/L (ref 19–32)
Chloride: 102 mEq/L (ref 96–112)
Creatinine, Ser: 0.74 mg/dL (ref 0.40–1.20)
GFR: 83.57 mL/min (ref >60.00)
GLUCOSE: 114 mg/dL — AB (ref 70–99)
Potassium: 4.2 mEq/L (ref 3.5–5.1)
Sodium: 142 mEq/L (ref 135–145)
TOTAL PROTEIN: 8.6 g/dL — AB (ref 6.0–8.3)

## 2015-06-27 LAB — VITAMIN D 25 HYDROXY (VIT D DEFICIENCY, FRACTURES): VITD: 20 ng/mL — AB (ref 30.00–100.00)

## 2015-06-27 LAB — TSH: TSH: 2.1 u[IU]/mL (ref 0.35–4.50)

## 2015-06-27 LAB — HEMOGLOBIN A1C: Hgb A1c MFr Bld: 5.7 % (ref 4.6–6.5)

## 2015-06-27 MED ORDER — CHOLECALCIFEROL 50 MCG (2000 UT) PO TABS: 1.0000 | ORAL_TABLET | Freq: Every day | ORAL | Status: DC

## 2015-07-02 ENCOUNTER — Other Ambulatory Visit: Payer: Self-pay | Admitting: Internal Medicine

## 2015-07-05 ENCOUNTER — Encounter: Payer: Self-pay | Admitting: Internal Medicine

## 2015-07-05 ENCOUNTER — Ambulatory Visit (INDEPENDENT_AMBULATORY_CARE_PROVIDER_SITE_OTHER): Payer: Medicare Other | Admitting: Internal Medicine

## 2015-07-05 VITALS — BP 134/84 | HR 66 | Temp 98.8°F | Resp 16 | Ht 64.0 in | Wt 233.0 lb

## 2015-07-05 DIAGNOSIS — I8312 Varicose veins of left lower extremity with inflammation: Secondary | ICD-10-CM

## 2015-07-05 DIAGNOSIS — D892 Hypergammaglobulinemia, unspecified: Secondary | ICD-10-CM

## 2015-07-05 DIAGNOSIS — I10 Essential (primary) hypertension: Secondary | ICD-10-CM | POA: Diagnosis not present

## 2015-07-05 DIAGNOSIS — F411 Generalized anxiety disorder: Secondary | ICD-10-CM | POA: Diagnosis not present

## 2015-07-05 DIAGNOSIS — E559 Vitamin D deficiency, unspecified: Secondary | ICD-10-CM

## 2015-07-05 DIAGNOSIS — I8311 Varicose veins of right lower extremity with inflammation: Secondary | ICD-10-CM

## 2015-07-05 DIAGNOSIS — E785 Hyperlipidemia, unspecified: Secondary | ICD-10-CM

## 2015-07-05 DIAGNOSIS — I872 Venous insufficiency (chronic) (peripheral): Secondary | ICD-10-CM

## 2015-07-05 MED ORDER — VASCULERA PO TABS: 1.0000 | ORAL_TABLET | Freq: Every day | ORAL | Status: DC

## 2015-07-05 MED ORDER — CLORAZEPATE DIPOTASSIUM 7.5 MG PO TABS: ORAL_TABLET | ORAL | Status: DC

## 2015-07-05 MED ORDER — FOSINOPRIL SODIUM-HCTZ 20-12.5 MG PO TABS: ORAL_TABLET | ORAL | Status: DC

## 2015-07-05 NOTE — Progress Notes (Signed)
Subjective:  Patient ID: Stephanie Pham, female    DOB: 05-06-1950  Age: 65 y.o. MRN: [id number]  CC: Hypertension   HPI WILBUR HA presents for a blood pressure check, she complains of areas on both lower extremities that feel itchy, burning, and sometimes uncomfortable. This has worsened over the last few years. She also needs a refill of Tranxene for her generalized anxiety disorder. She tells me her blood pressure has been well controlled on the combination of atenolol, and ACE inhibitor, and hydrochlorothiazide. She denies chest pain, shortness of breath, edema, palpitations, or fatigue.  She also needs a follow-up on the total protein elevation that is been present for about a year. She denies night sweats, weight loss, lymphadenopathy, fever, chills, diarrhea  Outpatient Prescriptions Prior to Visit  Medication Sig Dispense Refill  . acetaminophen (TYLENOL) 325 MG tablet Take 650 mg by mouth every 6 (six) hours as needed.    Marland Kitchen atenolol (TENORMIN) 50 MG tablet Take 1 tablet by mouth  daily 90 tablet 3  . Cholecalciferol 2000 units TABS Take 1 tablet (2,000 Units total) by mouth daily. 90 tablet 3  . Multiple Vitamins-Minerals (ICAPS AREDS 2) CAPS Take by mouth 2 (two) times daily.    . simvastatin (ZOCOR) 10 MG tablet Take 1 tablet by mouth at  bedtime 90 tablet 3  . clorazepate (TRANXENE) 7.5 MG tablet TAKE 1 TABLET BY MOUTH 4 TIMES A DAY 360 tablet 3  . fosinopril-hydrochlorothiazide (MONOPRIL-HCT) 20-12.5 MG tablet Take 1 tablet by mouth  daily 90 tablet 1  . atenolol (TENORMIN) 50 MG tablet Take 1 tablet (50 mg total) by mouth daily. 90 tablet 3   No facility-administered medications prior to visit.    ROS Review of Systems  Constitutional: Negative.  Negative for fever, chills, diaphoresis, appetite change and fatigue.  HENT: Negative.  Negative for congestion, facial swelling and trouble swallowing.   Eyes: Negative.  Negative for visual disturbance.  Respiratory:  Negative.  Negative for cough, choking, chest tightness, shortness of breath and stridor.   Cardiovascular: Negative.  Negative for chest pain, palpitations and leg swelling.  Gastrointestinal: Negative.  Negative for nausea, vomiting, abdominal pain, diarrhea, constipation and blood in stool.  Endocrine: Negative.   Genitourinary: Negative.  Negative for difficulty urinating.  Musculoskeletal: Negative.  Negative for myalgias, back pain, joint swelling and arthralgias.  Skin: Positive for color change and rash. Negative for pallor and wound.  Allergic/Immunologic: Negative.   Neurological: Negative.  Negative for dizziness and weakness.  Hematological: Negative.  Negative for adenopathy. Does not bruise/bleed easily.  Psychiatric/Behavioral: Negative for suicidal ideas, sleep disturbance, self-injury, dysphoric mood, decreased concentration and agitation. The patient is nervous/anxious.     Objective:  BP 134/84 mmHg  Pulse 66  Temp(Src) 98.8 F (37.1 C) (Oral)  Resp 16  Ht 5\' 4"  (1.626 m)  Wt 233 lb (105.688 kg)  BMI 39.97 kg/m2  SpO2 97%  BP Readings from Last 3 Encounters:  07/05/15 134/84  12/28/14 118/78  12/22/14 118/80    Wt Readings from Last 3 Encounters:  07/05/15 233 lb (105.688 kg)  12/28/14 238 lb (107.956 kg)  12/22/14 237 lb 12 oz (107.843 kg)    Physical Exam  Constitutional: She is oriented to person, place, and time. No distress.  HENT:  Mouth/Throat: Oropharynx is clear and moist. No oropharyngeal exudate.  Eyes: Conjunctivae are normal. Right eye exhibits no discharge. Left eye exhibits no discharge. No scleral icterus.  Neck: Normal range of motion.  Neck supple. No JVD present. No tracheal deviation present. No thyromegaly present.  Cardiovascular: Normal rate, regular rhythm, normal heart sounds and intact distal pulses.  Exam reveals no gallop and no friction rub.   No murmur heard. Pulmonary/Chest: Effort normal and breath sounds normal. No  stridor. No respiratory distress. She has no wheezes. She has no rales. She exhibits no tenderness.  Abdominal: Soft. Bowel sounds are normal. She exhibits no distension and no mass. There is no tenderness. There is no rebound and no guarding.  Musculoskeletal: Normal range of motion. She exhibits no edema or tenderness.  Over bilateral lower extremities more medially than anteriorly there are scattered, confluent areas of brawny induration, scale and faint erythema. There is no exudate, induration, fluctuance, warmth, streaking, tenderness, or ulcers.  Lymphadenopathy:    She has no cervical adenopathy.  Neurological: She is oriented to person, place, and time.  Skin: Skin is warm and dry. Rash noted. No petechiae and no purpura noted. Rash is not macular, not papular, not maculopapular, not nodular, not pustular, not vesicular and not urticarial. She is not diaphoretic. No erythema. No pallor.  Psychiatric: She has a normal mood and affect. Her behavior is normal. Judgment and thought content normal.  Vitals reviewed.   Lab Results  Component Value Date   WBC 5.7 06/27/2015   HGB 13.0 06/27/2015   HCT 38.5 06/27/2015   PLT 257.0 06/27/2015   GLUCOSE 114* 06/27/2015   CHOL 209* 02/05/2014   TRIG 318.0* 02/05/2014   HDL 47.10 02/05/2014   LDLDIRECT 111.2 02/05/2014   ALT 24 06/27/2015   AST 23 06/27/2015   NA 142 06/27/2015   K 4.2 06/27/2015   CL 102 06/27/2015   CREATININE 0.74 06/27/2015   BUN 14 06/27/2015   CO2 30 06/27/2015   TSH 2.10 06/27/2015   HGBA1C 5.7 06/27/2015    No results found.  Assessment & Plan:   Quineisha was seen today for hypertension.  Diagnoses and all orders for this visit:  GAD (generalized anxiety disorder) -     clorazepate (TRANXENE) 7.5 MG tablet; TAKE 1 TABLET BY MOUTH 4 TIMES A DAY  Hyperlipidemia with target LDL less than 130- she is tolerating simvastatin well with no side effects, will recheck her fasting lipid panel -     Lipid panel;  Future  Vitamin D deficiency  Essential hypertension- her blood pressures adequately well-controlled, electrolytes and renal function are stable. -     fosinopril-hydrochlorothiazide (MONOPRIL-HCT) 20-12.5 MG tablet; Take 1 tablet by mouth  daily  Paraproteinemia- I will recheck her total protein level and will screen her for lymphoproliferative dz by checking for gamma and lambda light chains as well as getting a serum protein electrophoresis. If abnormal then will refer her to HemeOnc. -     Kappa/lambda light chains; Future -     Protein electrophoresis, serum; Future  Venous stasis dermatitis of both lower extremities- she was advised to lose weight but will also treat this with vasculera -     Dietary Management Product (VASCULERA) TABS; Take 1 capsule by mouth daily.   I am having Ms. Hardaway start on Magazine. I am also having her maintain her acetaminophen, ICAPS AREDS 2, atenolol, simvastatin, Cholecalciferol, fosinopril-hydrochlorothiazide, and clorazepate.  Meds ordered this encounter  Medications  . fosinopril-hydrochlorothiazide (MONOPRIL-HCT) 20-12.5 MG tablet    Sig: Take 1 tablet by mouth  daily    Dispense:  90 tablet    Refill:  1  . clorazepate (TRANXENE) 7.5 MG  tablet    Sig: TAKE 1 TABLET BY MOUTH 4 TIMES A DAY    Dispense:  360 tablet    Refill:  3  . Dietary Management Product (VASCULERA) TABS    Sig: Take 1 capsule by mouth daily.    Dispense:  30 tablet    Refill:  11     Follow-up: Return in about 4 months (around 11/04/2015).  Scarlette Calico, MD

## 2015-07-05 NOTE — Progress Notes (Signed)
Pre visit review using our clinic review tool, if applicable. No additional management support is needed unless otherwise documented below in the visit note. 

## 2015-07-05 NOTE — Patient Instructions (Signed)
Hypertension Hypertension, commonly called high blood pressure, is when the force of blood pumping through your arteries is too strong. Your arteries are the blood vessels that carry blood from your heart throughout your body. A blood pressure reading consists of a higher number over a lower number, such as 110/72. The higher number (systolic) is the pressure inside your arteries when your heart pumps. The lower number (diastolic) is the pressure inside your arteries when your heart relaxes. Ideally you want your blood pressure below 120/80. Hypertension forces your heart to work harder to pump blood. Your arteries may become narrow or stiff. Having untreated or uncontrolled hypertension can cause heart attack, stroke, kidney disease, and other problems. RISK FACTORS Some risk factors for high blood pressure are controllable. Others are not.  Risk factors you cannot control include:   Race. You may be at higher risk if you are African American.  Age. Risk increases with age.  Gender. Men are at higher risk than women before age 45 years. After age 65, women are at higher risk than men. Risk factors you can control include:  Not getting enough exercise or physical activity.  Being overweight.  Getting too much fat, sugar, calories, or salt in your diet.  Drinking too much alcohol. SIGNS AND SYMPTOMS Hypertension does not usually cause signs or symptoms. Extremely high blood pressure (hypertensive crisis) may cause headache, anxiety, shortness of breath, and nosebleed. DIAGNOSIS To check if you have hypertension, your health care provider will measure your blood pressure while you are seated, with your arm held at the level of your heart. It should be measured at least twice using the same arm. Certain conditions can cause a difference in blood pressure between your right and left arms. A blood pressure reading that is higher than normal on one occasion does not mean that you need treatment. If  it is not clear whether you have high blood pressure, you may be asked to return on a different day to have your blood pressure checked again. Or, you may be asked to monitor your blood pressure at home for 1 or more weeks. TREATMENT Treating high blood pressure includes making lifestyle changes and possibly taking medicine. Living a healthy lifestyle can help lower high blood pressure. You may need to change some of your habits. Lifestyle changes may include:  Following the DASH diet. This diet is high in fruits, vegetables, and whole grains. It is low in salt, red meat, and added sugars.  Keep your sodium intake below 2,300 mg per day.  Getting at least 30-45 minutes of aerobic exercise at least 4 times per week.  Losing weight if necessary.  Not smoking.  Limiting alcoholic beverages.  Learning ways to reduce stress. Your health care provider may prescribe medicine if lifestyle changes are not enough to get your blood pressure under control, and if one of the following is true:  You are 18-59 years of age and your systolic blood pressure is above 140.  You are 60 years of age or older, and your systolic blood pressure is above 150.  Your diastolic blood pressure is above 90.  You have diabetes, and your systolic blood pressure is over 140 or your diastolic blood pressure is over 90.  You have kidney disease and your blood pressure is above 140/90.  You have heart disease and your blood pressure is above 140/90. Your personal target blood pressure may vary depending on your medical conditions, your age, and other factors. HOME CARE INSTRUCTIONS    Have your blood pressure rechecked as directed by your health care provider.   Take medicines only as directed by your health care provider. Follow the directions carefully. Blood pressure medicines must be taken as prescribed. The medicine does not work as well when you skip doses. Skipping doses also puts you at risk for  problems.  Do not smoke.   Monitor your blood pressure at home as directed by your health care provider. SEEK MEDICAL CARE IF:   You think you are having a reaction to medicines taken.  You have recurrent headaches or feel dizzy.  You have swelling in your ankles.  You have trouble with your vision. SEEK IMMEDIATE MEDICAL CARE IF:  You develop a severe headache or confusion.  You have unusual weakness, numbness, or feel faint.  You have severe chest or abdominal pain.  You vomit repeatedly.  You have trouble breathing. MAKE SURE YOU:   Understand these instructions.  Will watch your condition.  Will get help right away if you are not doing well or get worse.   This information is not intended to replace advice given to you by your health care provider. Make sure you discuss any questions you have with your health care provider.   Document Released: 03/05/2005 Document Revised: 07/20/2014 Document Reviewed: 12/26/2012 Elsevier Interactive Patient Education 2016 [employer]  

## 2015-07-13 DIAGNOSIS — L821 Other seborrheic keratosis: Secondary | ICD-10-CM | POA: Diagnosis not present

## 2015-07-13 DIAGNOSIS — L814 Other melanin hyperpigmentation: Secondary | ICD-10-CM | POA: Diagnosis not present

## 2015-07-13 DIAGNOSIS — D1801 Hemangioma of skin and subcutaneous tissue: Secondary | ICD-10-CM | POA: Diagnosis not present

## 2015-07-13 DIAGNOSIS — I8310 Varicose veins of unspecified lower extremity with inflammation: Secondary | ICD-10-CM | POA: Diagnosis not present

## 2015-07-13 DIAGNOSIS — D235 Other benign neoplasm of skin of trunk: Secondary | ICD-10-CM | POA: Diagnosis not present

## 2015-07-14 DIAGNOSIS — Z01419 Encounter for gynecological examination (general) (routine) without abnormal findings: Secondary | ICD-10-CM | POA: Diagnosis not present

## 2015-07-19 DIAGNOSIS — H353132 Nonexudative age-related macular degeneration, bilateral, intermediate dry stage: Secondary | ICD-10-CM | POA: Diagnosis not present

## 2015-07-19 DIAGNOSIS — H5212 Myopia, left eye: Secondary | ICD-10-CM | POA: Diagnosis not present

## 2015-07-19 DIAGNOSIS — Z961 Presence of intraocular lens: Secondary | ICD-10-CM | POA: Diagnosis not present

## 2015-09-03 ENCOUNTER — Other Ambulatory Visit: Payer: Self-pay | Admitting: Internal Medicine

## 2015-10-03 ENCOUNTER — Encounter: Payer: Self-pay | Admitting: Internal Medicine

## 2015-10-03 ENCOUNTER — Other Ambulatory Visit (INDEPENDENT_AMBULATORY_CARE_PROVIDER_SITE_OTHER): Payer: Medicare Other

## 2015-10-03 ENCOUNTER — Other Ambulatory Visit: Payer: Self-pay | Admitting: Internal Medicine

## 2015-10-03 DIAGNOSIS — E785 Hyperlipidemia, unspecified: Secondary | ICD-10-CM

## 2015-10-03 DIAGNOSIS — D892 Hypergammaglobulinemia, unspecified: Secondary | ICD-10-CM

## 2015-10-03 DIAGNOSIS — E781 Pure hyperglyceridemia: Secondary | ICD-10-CM | POA: Insufficient documentation

## 2015-10-03 LAB — LDL CHOLESTEROL, DIRECT: Direct LDL: 84 mg/dL

## 2015-10-03 LAB — LIPID PANEL
Cholesterol: 198 mg/dL (ref 0–200)
HDL: 43.5 mg/dL (ref >39.00)
NONHDL: 154.47
TRIGLYCERIDES: 384 mg/dL — AB (ref 0.0–149.0)
Total CHOL/HDL Ratio: 5
VLDL: 76.8 mg/dL — ABNORMAL HIGH (ref 0.0–40.0)

## 2015-10-03 MED ORDER — ICOSAPENT ETHYL 1 G PO CAPS: 2.0000 | ORAL_CAPSULE | Freq: Two times a day (BID) | ORAL | Status: DC

## 2015-10-04 LAB — KAPPA/LAMBDA LIGHT CHAINS
KAPPA FREE LGHT CHN: 21.4 mg/L — AB (ref 3.3–19.4)
KAPPA LAMBDA RATIO: 1.43 (ref 0.26–1.65)
LAMBDA FREE LGHT CHN: 15 mg/L (ref 5.7–26.3)

## 2015-10-05 ENCOUNTER — Encounter: Payer: Self-pay | Admitting: Internal Medicine

## 2015-10-05 LAB — PROTEIN ELECTROPHORESIS, SERUM
Albumin ELP: 4.4 g/dL (ref 3.8–4.8)
Alpha-1-Globulin: 0.4 g/dL — ABNORMAL HIGH (ref 0.2–0.3)
Alpha-2-Globulin: 1 g/dL — ABNORMAL HIGH (ref 0.5–0.9)
BETA GLOBULIN: 0.5 g/dL (ref 0.4–0.6)
Beta 2: 0.6 g/dL — ABNORMAL HIGH (ref 0.2–0.5)
Gamma Globulin: 1.3 g/dL (ref 0.8–1.7)
Total Protein, Serum Electrophoresis: 8.1 g/dL (ref 6.1–8.1)

## 2015-10-30 NOTE — Progress Notes (Addendum)
Subjective:   Stephanie Pham is a 65 y.o. female who presents for Medicare Annual (Subsequent) preventive examination.   HRA assessment completed during visit;  The Patient was informed that this wellness visit is to identify risk and educate on how to reduce risk for increase disease through lifestyle changes.   ROS deferred to CPE exam with physician  Family of diabetes and father died with MI at 3444  Medical issues HTN, Hyperlipidemia; (trig elevated 384)(HDL 43) (A1c 5.7)  medically tx with zocor Osteopenia; vit D and calcium;  Obesity;  Referred to [facility] for [facility]  High Triglycerdies   BMI: 40.7 in 2016 / remains 40 today morbid obesity; Related to emotional eating  admits to gaining weight  after son's death secondary to car accident by member of the family and they both embraced food to assist with coping; approx 65 yo.  Brother expired 11/03/2014 unknown cause  Diet; the patient states she eats well  Has changed her diet except for potato chips and dip Eating egg whites; [place] sausage; given up cheese and red meat; mostly [place]; limited fried foods But sill likes chips and dips; Discussed baked chips; Kale chips other without trans fat  Exercise; goal is not to sit as much; 3 miles on bike; work up to 30 minutes 3 to 4 days a week;  Stopped this from last year;  [facility]; recommeded silver sneakers and to get up and move   SAFETY/ full basement and upstairs; Goes up and down steps with caution.  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; [employer] and delivers;  Spouse goes downstairs to bring food up  Thinking about a chair lift; they have called but will fup Does not want to leave their home  Have remodeled Takes a shower in tub; free standing shower is small  Considering other renovations  [employer] referred for help with putting up grab bars   Falls; has not fallen this year/  Spouse has had surgery;    Safety reviewed for the home; including removal of clutter; clear paths through the home, railing as needed; bathroom safety; community safety; smoke detectors and firearms safety as well as sun protection;  Driving accidents/ has not driven  Sun protection  Stressors 2016; loss of brother recently and cause of death unknown; and loss of son 66 yo  Dental completed cleaning  06/2015 and goes back in October   Medication review/ New meds: areds x 2 daily; for eye disease (OTC)  Ordered Vascepa; could not afford; 440.00 the first time then 275.00  Dtr has told her about Krill oil and  Fish oil 1200mg  per pill;  Will send basket not to Dr. Yetta BarreJones.   Mobilization and Functional losses in the last year. Some functional losses in strength but  get up and go is still 10 sec x 10 feet    Urinary or fecal incontinence reviewed/some ua incontinence; will discuss with Dr. Yetta BarreJones at next fup  Seen dermatologist and everything was fine  Counseling: Hep C- reviewed and can have this drawn with physical. HIV to defer Dr. Yetta BarreJones  Colonoscopy; 11/06/2011/ Dr. Leone PayorGessner / unremarkable/ OB test recently;  (personal hx of colon cancer) Repeat 10/2016  EKG; deferred; going to heart doctor Dr. Dietrich PatesPaula ross   Dexa scan; 11/05/2013 (right femoral neck -1.3) repeat in 2 years/10/2015  continues Vit D and calcium and exercise will help Apt is 9/8   Mammogram 10/2014- apt is  9/8   PAP- Dr. Deatra Ina saw in April  Do not need them anymore; Hyst removed cervix   Ophthalmology exam; May with Dr. Satira Sark; No issues; Macular degeneration and taking ARED    Immunizations Due  Zoster/ declines for now Will discuss with Dr. Ronnald Ramp    Advanced Directive; did not completed/ given another   Current Care Team reviewed and updated  Cardiac Risk Factors include: advanced age (>81men, >51 women);dyslipidemia;family history of premature cardiovascular disease;hypertension;obesity (BMI >30kg/m2);sedentary  lifestyle     Objective:     Vitals: BP (!) 160/80   Pulse 72   Ht 5\' 4"  (1.626 m)   Wt 235 lb (106.6 kg)   SpO2 96%   BMI 40.34 kg/m   Body mass index is 40.34 kg/m.   Tobacco History  Smoking Status  . Never Smoker  Smokeless Tobacco  . Never Used     Counseling given: Yes   Past Medical History:  Diagnosis Date  . Chronic anxiety   . Chronic depression   . Diverticulosis of colon   . Dyslipidemia   . Hypertension   . Internal hemorrhoid    Past Surgical History:  Procedure Laterality Date  . ABDOMINAL HYSTERECTOMY    . CATARACT EXTRACTION     left eye 05-13-2013  . CHOLECYSTECTOMY    . laparotomy and removal of polyp    . sigmoid colectomy     Family History  Problem Relation Age of Onset  . Fibromyalgia Mother   . Hypertension Mother   . Heart disease Father   . Diabetes Father   . Cancer Maternal Aunt     Breast cancer  . Colon cancer Neg Hx   . Rectal cancer Neg Hx   . Stomach cancer Neg Hx   . Esophageal cancer Neg Hx    History  Sexual Activity  . Sexual activity: Not Currently    Outpatient Encounter Prescriptions as of 10/31/2015  Medication Sig  . acetaminophen (TYLENOL) 325 MG tablet Take 650 mg by mouth every 6 (six) hours as needed.  Marland Kitchen atenolol (TENORMIN) 50 MG tablet Take 1 tablet by mouth  daily  . Cholecalciferol 2000 units TABS Take 1 tablet (2,000 Units total) by mouth daily.  . clorazepate (TRANXENE) 7.5 MG tablet TAKE 1 TABLET BY MOUTH 4 TIMES A DAY  . Dietary Management Product (VASCULERA) TABS Take 1 capsule by mouth daily.  . fosinopril-hydrochlorothiazide (MONOPRIL-HCT) 20-12.5 MG tablet Take 1 tablet by mouth  daily  . Multiple Vitamins-Minerals (ICAPS AREDS 2) CAPS Take by mouth 2 (two) times daily.  . simvastatin (ZOCOR) 10 MG tablet Take 1 tablet by mouth at  bedtime  . Icosapent Ethyl 1 g CAPS Take 2 capsules by mouth 2 (two) times daily. (Patient not taking: Reported on 10/31/2015)   No facility-administered  encounter medications on file as of 10/31/2015.     Activities of Daily Living In your present state of health, do you have any difficulty performing the following activities: 10/31/2015 12/28/2014  Hearing? N N  Vision? N N  Difficulty concentrating or making decisions? N N  Walking or climbing stairs? N N  Dressing or bathing? N N  Doing errands, shopping? N N  Preparing Food and eating ? N -  Using the Toilet? N -  In the past six months, have you accidently leaked urine? Y -  Do you have problems with loss of bowel control? N -  Managing your Medications? N -  Managing your Finances? N -  Housekeeping or managing your Housekeeping? N -  Some recent data might be hidden    Patient Care Team: [name], MD as PCP - General (Internal Medicine)    Assessment:    Will request pedometer from [facility] (insurer)  and start walking  Will also monitor trans fat in diet  Educated on triglyceride and low fat diet  Will monitor BP as was 160 80 x 2 today;  Will call Dr. Ronnald Ramp if elevated BP continues  Would like to know if she "Has to take Vascepa" due to expense.   Declines Zostavax  Repeat Dexa and mammogram 9/8  Exercise Activities and Dietary recommendations Current Exercise Habits: Home exercise routine, Type of exercise: strength training/weights;walking (agreed to do exercise that was taught to her spouse), Time (Minutes): 20, Frequency (Times/Week): 5, Weekly Exercise (Minutes/Week): 100, Intensity: Moderate  Goals    . Exercise 150 minutes per week (moderate activity)          Will start stationary bike riding and build up to 30 min 3 to 4 times per day  Fat free or low fat dairy products Fish high in omega-3 acids ( salmon, tuna, trout) Fruits, such as apples, bananas, oranges, pears, prunes Legumes, such as kidney beans, lentils, checkpeas, black-eyed peas and lima beans Vegetables; broccoli, cabbage, carrots Whole grains;   Plant fats are better; decrease  "white" foods as pasta, rice, bread and desserts, sugar; Avoid red meat (limiting) palm and coconut oils; sugary foods and beverages  Two nutrients that raise blood chol levels are saturated fats and trans fat; in hydrogenated oils and fats, as stick margarine, baked goods (cookes, cakes, pies, crackers; frosting; and coffee creamers;   Some Fats lower cholesterol: Monounsaturated and polyunsaturated  Avocados Corn, sunflower, and soybean oils Nuts and seeds, such as walnuts Olive, canola, peanut, safflower, and sesame oils Peanut butter Salmon and trout Tofu       . Exercise 150 minutes per week (moderate activity)          [facility]edometer [facility] Will track walking and start with 20 minutes x 5 days a week ( can do 10 minutes at a time)     . Reduce fat intake to X grams per day          Keep trying to improve her diet;  Lowered fat overall        Fall Risk Fall Risk  10/31/2015 12/28/2014 12/22/2014 02/19/2014  Falls in the past year? No No No No   Depression Screen PHQ 2/9 Scores 10/31/2015 12/28/2014 12/22/2014 02/19/2014  PHQ - 2 Score 0 0 2 0  PHQ- 9 Score - 0 5 -     Cognitive Testing MMSE - Mini Mental State Exam 10/31/2015  Not completed: (No Data)   Ad8 score 0  Immunization History  Administered Date(s) Administered  . Influenza,inj,Quad PF,36+ Mos 02/09/2013, 11/17/2013, 12/22/2014  . Pneumococcal Conjugate-13 12/28/2014  . Tetanus 02/09/2013   Screening Tests Health Maintenance  Topic Date Due  . Hepatitis C Screening  27-Nov-1950  . HIV Screening  12/10/1964  . PAP SMEAR  12/11/1970  . ZOSTAVAX  12/10/2009  . INFLUENZA VACCINE  10/18/2015  . PNA vac Low Risk Adult (2 of 2 - PPSV23) 12/28/2015  . COLONOSCOPY  11/05/2016  . MAMMOGRAM  11/15/2016  . TETANUS/TDAP  02/10/2023  . DEXA SCAN  Completed      Plan:   Will monitor BP at home. States spouse is for test tomorrow and is worried  about that; Feels BP is always normal but requested she monitor since  spouse has been ill and undergoing come challenges in his health.  Enters home via downstairs and this is fall risk for both; Is considering chair lift  Will fup with dr. Ronnald Ramp regarding chol med as noted above.   During the course of the visit the patient was educated and counseled about the following appropriate screening and preventive services:   Vaccines to include Pneumoccal, Influenza, Hepatitis B, Td, Zostavax, HCV  Electrocardiogram  Cardiovascular Disease  Colorectal cancer screening  Bone density screening - repeat 9/8  Diabetes screening  Glaucoma screening  Mammography/ repeat 9/8   Nutrition counseling   Patient Instructions (the written plan) was given to the patient.   Wynetta Fines, RN  10/31/2015    Medical screening examination/treatment/procedure(s) were performed by non-physician practitioner and as supervising physician I was immediately available for consultation/collaboration. I agree with above. Scarlette Calico, MD

## 2015-10-31 ENCOUNTER — Telehealth: Payer: Self-pay

## 2015-10-31 ENCOUNTER — Ambulatory Visit (INDEPENDENT_AMBULATORY_CARE_PROVIDER_SITE_OTHER): Payer: Medicare Other

## 2015-10-31 VITALS — BP 160/80 | HR 72 | Ht 64.0 in | Wt 235.0 lb

## 2015-10-31 DIAGNOSIS — Z0000 Encounter for general adult medical examination without abnormal findings: Secondary | ICD-10-CM

## 2015-10-31 NOTE — Patient Instructions (Addendum)
Ms. Boyack , Thank you for taking time to come for your Medicare Wellness Visit. I appreciate your ongoing commitment to your health goals. Please review the following plan we discussed and let me know if I can assist you in the future.    [facility] [employer]; [phone] [facility]; [phone] Can contact "[employer]"  For assistance with grab bars;    Went for her hearing test last year and hearing is good   [employer], research (life sciences)  (for free hearing aid )  No reviews  [employer] [facility]  [place] [phone]  [phone]  [facility] offers free advance directive forms, as well as assistance in completing the forms themselves.  For assistance, contact the Spiritual Care Department at [phone], or the Clinical Social Work Department at [phone].  [employer]IT[employer] in chips etc    These are the goals we discussed: Goals    . Exercise 150 minutes per week (moderate activity)          Will start stationary bike riding and build up to 30 min 3 to 4 times per day  Fat free or low fat dairy products Fish high in omega-3 acids ( salmon, tuna, trout) Fruits, such as apples, bananas, oranges, pears, prunes Legumes, such as kidney beans, lentils, checkpeas, black-eyed peas and lima beans Vegetables; broccoli, cabbage, carrots Whole grains;   Plant fats are better; decrease "white" foods as pasta, rice, bread and desserts, sugar; Avoid red meat (limiting) palm and coconut oils; sugary foods and beverages  Two nutrients that raise blood chol levels are saturated fats and trans fat; in hydrogenated oils and fats, as stick margarine, baked goods (cookes, cakes, pies, crackers; frosting; and coffee creamers;   Some Fats lower cholesterol: Monounsaturated and polyunsaturated  Avocados Corn, sunflower, and soybean oils Nuts and seeds, such as walnuts Olive, canola, peanut, safflower, and sesame oils Peanut butter Salmon and trout  Tofu       . Reduce fat intake to X grams per day          Keep trying to improve her diet;  Lowered fat overall         This is a list of the screening recommended for you and due dates:  Health Maintenance  Topic Date Due  .  Hepatitis C: One time screening is recommended by Center for Disease Control  (CDC) for  adults born from [birth year] through 1966.   1950/05/05  . HIV Screening  12/10/1964  . Pap Smear  12/11/1970  . Shingles Vaccine  12/10/2009  . Flu Shot  10/18/2015  . Pneumonia vaccines (2 of 2 - PPSV23) 12/28/2015  . Colon Cancer Screening  11/05/2016  . Mammogram  11/15/2016  . Tetanus Vaccine  02/10/2023  . DEXA scan (bone density measurement)  Completed   Fat and Cholesterol Restricted Diet High levels of fat and cholesterol in your blood may lead to various health problems, such as diseases of the heart, blood vessels, gallbladder, liver, and pancreas. Fats are concentrated sources of energy that come in various forms. Certain types of fat, including saturated fat, may be harmful in excess. Cholesterol is a substance needed by your body in small amounts. Your body makes all the cholesterol it needs. Excess cholesterol comes from the food you eat. When you have high levels of cholesterol and saturated fat in your blood, health problems can develop because the excess fat and cholesterol will gather along the walls of your  blood vessels, causing them to narrow. Choosing the right foods will help you control your intake of fat and cholesterol. This will help keep the levels of these substances in your blood within normal limits and reduce your risk of disease. WHAT IS MY PLAN? Your health care provider recommends that you:  Get no more than __________ % of the total calories in your daily diet from fat.  Limit your intake of saturated fat to less than ______% of your total calories each day.  Limit the amount of cholesterol in your diet to less than _________mg per  day. WHAT TYPES OF FAT SHOULD I CHOOSE?  Choose healthy fats more often. Choose monounsaturated and polyunsaturated fats, such as olive and canola oil, flaxseeds, walnuts, almonds, and seeds.  Eat more omega-3 fats. Good choices include salmon, mackerel, sardines, tuna, flaxseed oil, and ground flaxseeds. Aim to eat fish at least two times a week.  Limit saturated fats. Saturated fats are primarily found in animal products, such as meats, butter, and cream. Plant sources of saturated fats include palm oil, palm kernel oil, and coconut oil.  Avoid foods with partially hydrogenated oils in them. These contain trans fats. Examples of foods that contain trans fats are stick margarine, some tub margarines, cookies, crackers, and other baked goods. WHAT GENERAL GUIDELINES DO I NEED TO FOLLOW? These guidelines for healthy eating will help you control your intake of fat and cholesterol:  Check food labels carefully to identify foods with trans fats or high amounts of saturated fat.  Fill one half of your plate with vegetables and green salads.  Fill one fourth of your plate with whole grains. Look for the word "whole" as the first word in the ingredient list.  Fill one fourth of your plate with lean protein foods.  Limit fruit to two servings a day. Choose fruit instead of juice.  Eat more foods that contain soluble fiber. Examples of foods that contain this type of fiber are apples, broccoli, carrots, beans, peas, and barley. Aim to get 20-30 g of fiber per day.  Eat more home-cooked food and less restaurant, buffet, and fast food.  Limit or avoid alcohol.  Limit foods high in starch and sugar.  Limit fried foods.  Cook foods using methods other than frying. Baking, boiling, grilling, and broiling are all great options.  Lose weight if you are overweight. Losing just 5-10% of your initial body weight can help your overall health and prevent diseases such as diabetes and heart  disease. WHAT FOODS CAN I EAT? Grains Whole grains, such as whole wheat or whole grain breads, crackers, cereals, and pasta. Unsweetened oatmeal, bulgur, barley, quinoa, or brown rice. Corn or whole wheat flour tortillas. Vegetables Fresh or frozen vegetables (raw, steamed, roasted, or grilled). Green salads. Fruits All fresh, canned (in natural juice), or frozen fruits. Meat and Other Protein Products Ground beef (85% or leaner), grass-fed beef, or beef trimmed of fat. Skinless chicken or [place]. Ground chicken or [place]. Pork trimmed of fat. All fish and seafood. Eggs. Dried beans, peas, or lentils. Unsalted nuts or seeds. Unsalted canned or dry beans. Dairy Low-fat dairy products, such as skim or 1% milk, 2% or reduced-fat cheeses, low-fat ricotta or cottage cheese, or plain low-fat yogurt. Fats and Oils Tub margarines without trans fats. Light or reduced-fat mayonnaise and salad dressings. Avocado. Olive, canola, sesame, or safflower oils. Natural peanut or almond butter (choose ones without added sugar and oil). The items listed above may not be a  complete list of recommended foods or beverages. Contact your dietitian for more options. WHAT FOODS ARE NOT RECOMMENDED? Grains White bread. White pasta. White rice. Cornbread. Bagels, pastries, and croissants. Crackers that contain trans fat. Vegetables White potatoes. Corn. Creamed or fried vegetables. Vegetables in a cheese sauce. Fruits Dried fruits. Canned fruit in light or heavy syrup. Fruit juice. Meat and Other Protein Products Fatty cuts of meat. Ribs, chicken wings, bacon, sausage, bologna, salami, chitterlings, fatback, hot dogs, bratwurst, and packaged luncheon meats. Liver and organ meats. Dairy Whole or 2% milk, cream, half-and-half, and cream cheese. Whole milk cheeses. Whole-fat or sweetened yogurt. Full-fat cheeses. Nondairy creamers and whipped toppings. Processed cheese, cheese spreads, or cheese curds. Sweets and  Desserts Corn syrup, sugars, honey, and molasses. Candy. Jam and jelly. Syrup. Sweetened cereals. Cookies, pies, cakes, donuts, muffins, and ice cream. Fats and Oils Butter, stick margarine, lard, shortening, ghee, or bacon fat. Coconut, palm kernel, or palm oils. Beverages Alcohol. Sweetened drinks (such as sodas, lemonade, and fruit drinks or punches). The items listed above may not be a complete list of foods and beverages to avoid. Contact your dietitian for more information.   This information is not intended to replace advice given to you by your health care provider. Make sure you discuss any questions you have with your health care provider.   Document Released: 03/05/2005 Document Revised: 03/26/2014 Document Reviewed: 06/03/2013 Elsevier Interactive Patient Education 2016 [place] in the Home  Falls can cause injuries. They can happen to people of all ages. There are many things you can do to make your home safe and to help prevent falls.  WHAT CAN I DO ON THE OUTSIDE OF MY HOME?  Regularly fix the edges of walkways and driveways and fix any cracks.  Remove anything that might make you trip as you walk through a door, such as a raised step or threshold.  Trim any bushes or trees on the path to your home.  Use bright outdoor lighting.  Clear any walking paths of anything that might make someone trip, such as rocks or tools.  Regularly check to see if handrails are loose or broken. Make sure that both sides of any steps have handrails.  Any raised decks and porches should have guardrails on the edges.  Have any leaves, snow, or ice cleared regularly.  Use sand or salt on walking paths during winter.  Clean up any spills in your garage right away. This includes oil or grease spills. WHAT CAN I DO IN THE BATHROOM?   Use night lights.  Install grab bars by the toilet and in the tub and shower. Do not use towel bars as grab bars.  Use  non-skid mats or decals in the tub or shower.  If you need to sit down in the shower, use a plastic, non-slip stool.  Keep the floor dry. Clean up any water that spills on the floor as soon as it happens.  Remove soap buildup in the tub or shower regularly.  Attach bath mats securely with double-sided non-slip rug tape.  Do not have throw rugs and other things on the floor that can make you trip. WHAT CAN I DO IN THE BEDROOM?  Use night lights.  Make sure that you have a light by your bed that is easy to reach.  Do not use any sheets or blankets that are too big for your bed. They should not hang down onto the floor.  Have a firm chair that has side arms. You can use this for support while you get dressed.  Do not have throw rugs and other things on the floor that can make you trip. WHAT CAN I DO IN THE KITCHEN?  Clean up any spills right away.  Avoid walking on wet floors.  Keep items that you use a lot in easy-to-reach places.  If you need to reach something above you, use a strong step stool that has a grab bar.  Keep electrical cords out of the way.  Do not use floor polish or wax that makes floors slippery. If you must use wax, use non-skid floor wax.  Do not have throw rugs and other things on the floor that can make you trip. WHAT CAN I DO WITH MY STAIRS?  Do not leave any items on the stairs.  Make sure that there are handrails on both sides of the stairs and use them. Fix handrails that are broken or loose. Make sure that handrails are as long as the stairways.  Check any carpeting to make sure that it is firmly attached to the stairs. Fix any carpet that is loose or worn.  Avoid having throw rugs at the top or bottom of the stairs. If you do have throw rugs, attach them to the floor with carpet tape.  Make sure that you have a light switch at the top of the stairs and the bottom of the stairs. If you do not have them, ask someone to add them for you. WHAT  ELSE CAN I DO TO HELP PREVENT FALLS?  Wear shoes that:  Do not have high heels.  Have rubber bottoms.  Are comfortable and fit you well.  Are closed at the toe. Do not wear sandals.  If you use a stepladder:  Make sure that it is fully opened. Do not climb a closed stepladder.  Make sure that both sides of the stepladder are locked into place.  Ask someone to hold it for you, if possible.  Clearly mark and make sure that you can see:  Any grab bars or handrails.  First and last steps.  Where the edge of each step is.  Use tools that help you move around (mobility aids) if they are needed. These include:  Canes.  Walkers.  Scooters.  Crutches.  Turn on the lights when you go into a dark area. Replace any light bulbs as soon as they burn out.  Set up your furniture so you have a clear path. Avoid moving your furniture around.  If any of your floors are uneven, fix them.  If there are any pets around you, be aware of where they are.  Review your medicines with your doctor. Some medicines can make you feel dizzy. This can increase your chance of falling. Ask your doctor what other things that you can do to help prevent falls.   This information is not intended to replace advice given to you by your health care provider. Make sure you discuss any questions you have with your health care provider.   Document Released: 12/30/2008 Document Revised: 07/20/2014 Document Reviewed: 04/09/2014 Elsevier Interactive Patient Education 2016 [place] Maintenance, Female Adopting a healthy lifestyle and getting preventive care can go a long way to promote health and wellness. Talk with your health care provider about what schedule of regular examinations is right for you. This is a good chance for you to check in with your provider about disease  prevention and staying healthy. In between checkups, there are plenty of things you can do on your own. Experts have done  a lot of research about which lifestyle changes and preventive measures are most likely to keep you healthy. Ask your health care provider for more information. WEIGHT AND DIET  Eat a healthy diet  Be sure to include plenty of vegetables, fruits, low-fat dairy products, and lean protein.  Do not eat a lot of foods high in solid fats, added sugars, or salt.  Get regular exercise. This is one of the most important things you can do for your health.  Most adults should exercise for at least 150 minutes each week. The exercise should increase your heart rate and make you sweat (moderate-intensity exercise).  Most adults should also do strengthening exercises at least twice a week. This is in addition to the moderate-intensity exercise.  Maintain a healthy weight  Body mass index (BMI) is a measurement that can be used to identify possible weight problems. It estimates body fat based on height and weight. Your health care provider can help determine your BMI and help you achieve or maintain a healthy weight.  For females 51 years of age and older:   A BMI below 18.5 is considered underweight.  A BMI of 18.5 to 24.9 is normal.  A BMI of 25 to 29.9 is considered overweight.  A BMI of 30 and above is considered obese.  Watch levels of cholesterol and blood lipids  You should start having your blood tested for lipids and cholesterol at 66 years of age, then have this test every 5 years.  You may need to have your cholesterol levels checked more often if:  Your lipid or cholesterol levels are high.  You are older than 66 years of age.  You are at high risk for heart disease.  CANCER SCREENING   Lung Cancer  Lung cancer screening is recommended for adults 38-47 years old who are at high risk for lung cancer because of a history of smoking.  A yearly low-dose CT scan of the lungs is recommended for people who:  Currently smoke.  Have quit within the past 15 years.  Have at  least a 30-pack-year history of smoking. A pack year is smoking an average of one pack of cigarettes a day for 1 year.  Yearly screening should continue until it has been 15 years since you quit.  Yearly screening should stop if you develop a health problem that would prevent you from having lung cancer treatment.  Breast Cancer  Practice breast self-awareness. This means understanding how your breasts normally appear and feel.  It also means doing regular breast self-exams. Let your health care provider know about any changes, no matter how small.  If you are in your 20s or 30s, you should have a clinical breast exam (CBE) by a health care provider every 1-3 years as part of a regular health exam.  If you are 46 or older, have a CBE every year. Also consider having a breast X-ray (mammogram) every year.  If you have a family history of breast cancer, talk to your health care provider about genetic screening.  If you are at high risk for breast cancer, talk to your health care provider about having an MRI and a mammogram every year.  Breast cancer gene (BRCA) assessment is recommended for women who have family members with BRCA-related cancers. BRCA-related cancers include:  Breast.  Ovarian.  Tubal.  Peritoneal cancers.  Results of the assessment will determine the need for genetic counseling and BRCA1 and BRCA2 testing. Cervical Cancer Your health care provider may recommend that you be screened regularly for cancer of the pelvic organs (ovaries, uterus, and vagina). This screening involves a pelvic examination, including checking for microscopic changes to the surface of your cervix (Pap test). You may be encouraged to have this screening done every 3 years, beginning at age 66.  For women ages 77-65, health care providers may recommend pelvic exams and Pap testing every 3 years, or they may recommend the Pap and pelvic exam, combined with testing for human papilloma virus (HPV),  every 5 years. Some types of HPV increase your risk of cervical cancer. Testing for HPV may also be done on women of any age with unclear Pap test results.  Other health care providers may not recommend any screening for nonpregnant women who are considered low risk for pelvic cancer and who do not have symptoms. Ask your health care provider if a screening pelvic exam is right for you.  If you have had past treatment for cervical cancer or a condition that could lead to cancer, you need Pap tests and screening for cancer for at least 20 years after your treatment. If Pap tests have been discontinued, your risk factors (such as having a new sexual partner) need to be reassessed to determine if screening should resume. Some women have medical problems that increase the chance of getting cervical cancer. In these cases, your health care provider may recommend more frequent screening and Pap tests. Colorectal Cancer  This type of cancer can be detected and often prevented.  Routine colorectal cancer screening usually begins at 66 years of age and continues through 67 years of age.  Your health care provider may recommend screening at an earlier age if you have risk factors for colon cancer.  Your health care provider may also recommend using home test kits to check for hidden blood in the stool.  A small camera at the end of a tube can be used to examine your colon directly (sigmoidoscopy or colonoscopy). This is done to check for the earliest forms of colorectal cancer.  Routine screening usually begins at age 91.  Direct examination of the colon should be repeated every 5-10 years through 66 years of age. However, you may need to be screened more often if early forms of precancerous polyps or small growths are found. Skin Cancer  Check your skin from head to toe regularly.  Tell your health care provider about any new moles or changes in moles, especially if there is a change in a mole's shape  or color.  Also tell your health care provider if you have a mole that is larger than the size of a pencil eraser.  Always use sunscreen. Apply sunscreen liberally and repeatedly throughout the day.  Protect yourself by wearing long sleeves, pants, a wide-brimmed hat, and sunglasses whenever you are outside. HEART DISEASE, DIABETES, AND HIGH BLOOD PRESSURE   High blood pressure causes heart disease and increases the risk of stroke. High blood pressure is more likely to develop in:  People who have blood pressure in the high end of the normal range (130-139/85-89 mm Hg).  People who are overweight or obese.  People who are African American.  If you are 57-28 years of age, have your blood pressure checked every 3-5 years. If you are 53 years of age or older, have your blood pressure checked every year.  You should have your blood pressure measured twice--once when you are at a hospital or clinic, and once when you are not at a hospital or clinic. Record the average of the two measurements. To check your blood pressure when you are not at a hospital or clinic, you can use:  An automated blood pressure machine at a pharmacy.  A home blood pressure monitor.  If you are between 38 years and 38 years old, ask your health care provider if you should take aspirin to prevent strokes.  Have regular diabetes screenings. This involves taking a blood sample to check your fasting blood sugar level.  If you are at a normal weight and have a low risk for diabetes, have this test once every three years after 66 years of age.  If you are overweight and have a high risk for diabetes, consider being tested at a younger age or more often. PREVENTING INFECTION  Hepatitis B  If you have a higher risk for hepatitis B, you should be screened for this virus. You are considered at high risk for hepatitis B if:  You were born in a country where hepatitis B is common. Ask your health care provider which  countries are considered high risk.  Your parents were born in a high-risk country, and you have not been immunized against hepatitis B (hepatitis B vaccine).  You have HIV or AIDS.  You use needles to inject street drugs.  You live with someone who has hepatitis B.  You have had sex with someone who has hepatitis B.  You get hemodialysis treatment.  You take certain medicines for conditions, including cancer, organ transplantation, and autoimmune conditions. Hepatitis C  Blood testing is recommended for:  Everyone born from [birth year] through 1966.  Anyone with known risk factors for hepatitis C. Sexually transmitted infections (STIs)  You should be screened for sexually transmitted infections (STIs) including gonorrhea and chlamydia if:  You are sexually active and are younger than 66 years of age.  You are older than 66 years of age and your health care provider tells you that you are at risk for this type of infection.  Your sexual activity has changed since you were last screened and you are at an increased risk for chlamydia or gonorrhea. Ask your health care provider if you are at risk.  If you do not have HIV, but are at risk, it may be recommended that you take a prescription medicine daily to prevent HIV infection. This is called pre-exposure prophylaxis (PrEP). You are considered at risk if:  You are sexually active and do not regularly use condoms or know the HIV status of your partner(s).  You take drugs by injection.  You are sexually active with a partner who has HIV. Talk with your health care provider about whether you are at high risk of being infected with HIV. If you choose to begin PrEP, you should first be tested for HIV. You should then be tested every 3 months for as long as you are taking PrEP.  PREGNANCY   If you are premenopausal and you may become pregnant, ask your health care provider about preconception counseling.  If you may become pregnant, take  400 to 800 micrograms (mcg) of folic acid every day.  If you want to prevent pregnancy, talk to your health care provider about birth control (contraception). OSTEOPOROSIS AND MENOPAUSE   Osteoporosis is a disease in which the bones lose minerals and strength with aging. This can result  in serious bone fractures. Your risk for osteoporosis can be identified using a bone density scan.  If you are 2 years of age or older, or if you are at risk for osteoporosis and fractures, ask your health care provider if you should be screened.  Ask your health care provider whether you should take a calcium or vitamin D supplement to lower your risk for osteoporosis.  Menopause may have certain physical symptoms and risks.  Hormone replacement therapy may reduce some of these symptoms and risks. Talk to your health care provider about whether hormone replacement therapy is right for you.  HOME CARE INSTRUCTIONS   Schedule regular health, dental, and eye exams.  Stay current with your immunizations.   Do not use any tobacco products including cigarettes, chewing tobacco, or electronic cigarettes.  If you are pregnant, do not drink alcohol.  If you are breastfeeding, limit how much and how often you drink alcohol.  Limit alcohol intake to no more than 1 drink per day for nonpregnant women. One drink equals 12 ounces of beer, 5 ounces of wine, or 1 ounces of hard liquor.  Do not use street drugs.  Do not share needles.  Ask your health care provider for help if you need support or information about quitting drugs.  Tell your health care provider if you often feel depressed.  Tell your health care provider if you have ever been abused or do not feel safe at home.   This information is not intended to replace advice given to you by your health care provider. Make sure you discuss any questions you have with your health care provider.   Document Released: 09/18/2010 Document Revised:  03/26/2014 Document Reviewed: 02/04/2013 Elsevier Interactive Patient Education [employer].

## 2015-10-31 NOTE — Telephone Encounter (Signed)
She can just take the Krill oil and I will check her triglyceride levels the next time I see her

## 2015-10-31 NOTE — Telephone Encounter (Signed)
In for AWV Stephanie Pham stated that the Vascepa was 440.00 for the first time and then 275.00; Dtr told her to use Krill oil She is using fish oil 1200mg ; x 1 per day.  Stated she would buy the medicine if other is not appropriate and she "really" needs it. Has made changes in diet; but still eating chips and dip   Please advise  Tks

## 2015-11-01 NOTE — Telephone Encounter (Signed)
Call to ms. Gul and agreed to get [facility] and will have triglycerides checked at the next office visit

## 2015-11-05 ENCOUNTER — Other Ambulatory Visit: Payer: Self-pay | Admitting: Internal Medicine

## 2015-11-05 DIAGNOSIS — I10 Essential (primary) hypertension: Secondary | ICD-10-CM

## 2015-11-23 ENCOUNTER — Encounter: Payer: Self-pay | Admitting: Genetic Counselor

## 2015-11-24 ENCOUNTER — Encounter: Payer: Self-pay | Admitting: Genetic Counselor

## 2015-11-24 ENCOUNTER — Other Ambulatory Visit: Payer: Self-pay | Admitting: Internal Medicine

## 2015-11-24 ENCOUNTER — Telehealth: Payer: Self-pay | Admitting: *Deleted

## 2015-11-24 ENCOUNTER — Other Ambulatory Visit: Payer: Medicare Other

## 2015-11-24 ENCOUNTER — Ambulatory Visit (HOSPITAL_BASED_OUTPATIENT_CLINIC_OR_DEPARTMENT_OTHER): Payer: Medicare Other | Admitting: Genetic Counselor

## 2015-11-24 DIAGNOSIS — Z85038 Personal history of other malignant neoplasm of large intestine: Secondary | ICD-10-CM | POA: Diagnosis not present

## 2015-11-24 DIAGNOSIS — Z809 Family history of malignant neoplasm, unspecified: Secondary | ICD-10-CM | POA: Diagnosis not present

## 2015-11-24 DIAGNOSIS — F411 Generalized anxiety disorder: Secondary | ICD-10-CM

## 2015-11-24 DIAGNOSIS — Z803 Family history of malignant neoplasm of breast: Secondary | ICD-10-CM | POA: Diagnosis not present

## 2015-11-24 DIAGNOSIS — Z808 Family history of malignant neoplasm of other organs or systems: Secondary | ICD-10-CM

## 2015-11-24 MED ORDER — CLORAZEPATE DIPOTASSIUM 7.5 MG PO TABS: ORAL_TABLET | ORAL | 0 refills | Status: DC

## 2015-11-24 NOTE — Telephone Encounter (Signed)
rx faxed [facility].

## 2015-11-24 NOTE — Telephone Encounter (Signed)
RX written 

## 2015-11-24 NOTE — Telephone Encounter (Signed)
Rec'd fax from [facility] pt requesting 90 day supply on clorazepate...Johny Chess

## 2015-11-24 NOTE — Progress Notes (Signed)
REFERRING PROVIDER: [name], MD [address] [place] [address]  PRIMARY PROVIDER:  Scarlette Calico, MD  PRIMARY REASON FOR VISIT:  1. History of colon cancer   2. Family history of breast cancer in female   3. Family history of skin cancer   4. Family history of cancer      HISTORY OF PRESENT ILLNESS:   Stephanie Pham, a 65 y.o. female, was seen for a [facility] cancer genetics consultation due to a personal history of colon cancer at 66 and family history of early-onset breast cancer.  Stephanie Pham presents to clinic today with her husband to discuss the possibility of a hereditary predisposition to cancer, genetic testing, and to further clarify her future cancer risks, as well as potential cancer risks for family members.  Her husband, Jeanell Sparrow, was previously seen in genetic counseling, and, at that time, Stephanie Pham mentioned her history of early-onset colon cancer and we had recommended she come back in for genetic counseling and testing.  In 1976, at the age of 22, Stephanie Pham was diagnosed with colon cancer. This was treated with surgery, and we do not have a pathology report available.  Stephanie Pham also reports a history of approximately 5 colon adenomas.  She had a second colon surgery in 2008 to address diverticulitis and colon stricture.  She reports no additional personal history of cancer.    HORMONAL RISK FACTORS:  Menarche was at age 58.  First live birth at age - not assessed.  OCP use for approximately 0 years.  Ovaries intact: yes.  Hysterectomy: yes, in 62 (age 82) for prolapsed uterus.  Menopausal status: postmenopausal.  HRT use: 0 years. Colonoscopy: yes; history of 5 additional adenomatous polyps; most recent colonoscopy in 2013; due for next colonoscopy in 2018. Mammogram within the last year: yes. Number of breast biopsies: 0. Up to date with pelvic exams:  Yes. Any excessive radiation exposure/other exposures in the past:  Reports history  of some secondhand smoke exposure (brothers smoked)  Past Medical History:  Diagnosis Date  . Chronic anxiety   . Chronic depression   . Diverticulosis of colon   . Dyslipidemia   . Hypertension   . Internal hemorrhoid     Past Surgical History:  Procedure Laterality Date  . ABDOMINAL HYSTERECTOMY    . CATARACT EXTRACTION     left eye 05-13-2013  . CHOLECYSTECTOMY    . laparotomy and removal of polyp    . sigmoid colectomy      Social History   Social History  . Marital status: Married    Spouse name: N/A  . Number of children: N/A  . Years of education: N/A   Social History Main Topics  . Smoking status: Never Smoker  . Smokeless tobacco: Never Used  . Alcohol use No  . Drug use: No  . Sexual activity: Not Currently   Other Topics Concern  . None   Social History Narrative   Married 1968   2 sons, 1 daughter lost one son in MVA   Full-time homemaker     FAMILY HISTORY:  We obtained a detailed, 4-generation family history.  Significant diagnoses are listed below: Family History  Problem Relation Age of Onset  . Fibromyalgia Mother   . Hypertension Mother   . Other Mother     "hallucinations" and fibromyalgia  . Ovarian cysts Mother     "growths"  . Heart disease Father   . Diabetes Father   .  Heart attack Father 40    d. due to 2nd heart attack at 66  . Breast cancer Maternal Aunt     dx 73s; s/p mastectomy  . Other Maternal Aunt     hx of colon surgery in her 55s, lim info  . COPD Brother   . Heart attack Brother   . Kidney disease Brother     smoker; d. 36  . Bipolar disorder Brother   . Skin cancer Brother     described as a "black mole"  . Diabetes Brother   . Endometriosis Daughter   . Other Daughter     hx of breast biopsies  . Diabetes Paternal Aunt   . AAA (abdominal aortic aneurysm) Paternal Aunt   . Stroke Maternal Grandfather   . Other Maternal Grandfather     had an operation on his intestines to address constipation  . Stroke  Paternal Aunt   . Stroke Paternal Aunt   . Diabetes Paternal Uncle   . Cancer Cousin     d. NOS cancer (maybe liver or lung cancer); +EtOH  . Diabetes Paternal Grandmother   . Other Paternal Grandmother     intestinal issues  . Stroke Paternal Grandfather   . Bipolar disorder Other   . Colon cancer Neg Hx   . Rectal cancer Neg Hx   . Stomach cancer Neg Hx   . Esophageal cancer Neg Hx     Stephanie Pham has one son who is 68 and one daughter who is 55.  Her other son died in a motor vehicle accident when he was 59 years old.  Stephanie Pham daughter has a history of endometriosis and a history of benign breast biopsies.  Her daughter has had negative genetic testing at [facility].  Stephanie Pham has three grandchildren.  She has two full brothers.  One brother passed away from COPD, kidney disease, and possibly a heart attack at the age of 56.  Her other brother is currently 46 and has a history of skin cancer.    Stephanie Pham mother died of "aspiration pneumonia" at 7.  She had a history of ovarian cysts and/or ovarian "growths", but these were never diagnosed as cancerous.  Stephanie Pham mother had four full brothers and two full sisters, all of whom have passed away.  One sister was diagnosed with breast cancer and treated with mastectomy in her 67s.  This same sister also had a colon surgery for an unspecified reason in her 105s.  She had two sons, neither of whom had cancer.  One sister died at just a few days of age.  One brother died in a car accident in his mid-37s.  The other three brothers passed away in their 70s-90s and never had cancer.  Stephanie Pham maternal grandmother passed away at 88 and never had cancer.  Her grandfather died following an intestinal operation to treat constipation; he was 10.  Stephanie Pham has limited further information for more distant maternal relatives.    Stephanie Pham father had a heart attack at 83, he had another heart attack at 7 and passed away.  He had two full  brothers and three full sisters, all of whom have passed away.  One brother died of polio at 73.  One sister died from diabetes and "liver complications" at 26.  The other siblings passed away in their late 70s-80s and never had cancer.  One paternal first cousin died of an unspecified type of cancer in his 63s.  He was a heavy drinker and Ms. Nutting believes this may have been a liver or lung cancer.  Her paternal grandmother died of intestinal issues at 31.  Her grandfather died of a stroke in his late 33s.  Ms. Furth has limited further information for other paternal relatives.  Patient's maternal and paternal ancestors are of Caucasian and Native American descent. There is no reported Ashkenazi Jewish ancestry. There is no known consanguinity.  GENETIC COUNSELING ASSESSMENT: ROZLYNN LIPPOLD is a 65 y.o. female with a personal and family history of early-onset cancer which is somewhat suggestive of a hereditary cancer syndrome and predisposition to cancer. We, therefore, discussed and recommended the following at today's visit.   DISCUSSION: We reviewed the characteristics, features and inheritance patterns of hereditary cancer syndromes, particularly those caused by mutations within the Lynch syndrome, APC, MUTYH, and BRCA1/2 genes. We also discussed genetic testing, including the appropriate family members to test, the process of testing, insurance coverage and turn-around-time for results. We discussed the implications of a negative, positive and/or variant of uncertain significant result. We recommended Ms. Solecki pursue genetic testing for the 32-gene Comprehensive Cancer Panel with MSH2 Exons 1-7 Inversion Analysis through [facility].  The Comprehensive Cancer Panel offered by [facility] includes sequencing and/or deletion duplication testing of the following 32 genes: APC, ATM, AXIN2, BARD1, BMPR1A, BRCA1, BRCA2, BRIP1, CDH1, CDK4, CDKN2A, CHEK2, EPCAM, FANCC, MLH1, MSH2, MSH6, MUTYH, NBN,  PALB2, PMS2, POLD1, POLE, PTEN, RAD51C, RAD51D, SCG5/GREM1, SMAD4, STK11, TP53, VHL, and XRCC2.    Based on Ms. Funes's personal and family history of cancer, she meets medical criteria for genetic testing. Despite that she meets criteria, she may still have an out of pocket cost. We discussed that if her out of pocket cost for testing is over $100, the laboratory will call and confirm whether she wants to proceed with testing.  If the out of pocket cost of testing is less than $100 she will be billed by the genetic testing laboratory.   PLAN: After considering the risks, benefits, and limitations, Ms. Petron  provided informed consent to pursue genetic testing and the blood sample was sent to [facility] for analysis of the 32-gene Comprehensive Cancer Panel with MSH2 Exons 1-7 Inversion Analysis. Results should be available within approximately 2-3 weeks' time, at which point they will be disclosed by telephone to Ms. Breit, as will any additional recommendations warranted by these results. Ms. Depaoli will receive a summary of her genetic counseling visit and a copy of her results once available. This information will also be available in Epic. We encouraged Ms. Karen to remain in contact with cancer genetics annually so that we can continuously update the family history and inform her of any changes in cancer genetics and testing that may be of benefit for her family. Ms. Anfinson questions were answered to her satisfaction today. Our contact information was provided should additional questions or concerns arise.  Thank you for the referral and allowing [place] to share in the care of your patient.   Jeanine Luz, MS, [facility] Certified Genetic Counselor [email or web address] Phone: [phone]  The patient was seen for a total of 60 minutes in face-to-face genetic counseling.  This patient was discussed with Drs. Magrinat, Lindi Adie and/or Burr Medico who agrees with the above.     _______________________________________________________________________ For Office Staff:  Number of people involved in session: 2 Was an Intern/ student involved with case: no

## 2015-11-25 DIAGNOSIS — M85851 Other specified disorders of bone density and structure, right thigh: Secondary | ICD-10-CM | POA: Diagnosis not present

## 2015-11-25 DIAGNOSIS — Z803 Family history of malignant neoplasm of breast: Secondary | ICD-10-CM | POA: Diagnosis not present

## 2015-11-25 DIAGNOSIS — Z1231 Encounter for screening mammogram for malignant neoplasm of breast: Secondary | ICD-10-CM | POA: Diagnosis not present

## 2015-11-25 LAB — HM DEXA SCAN

## 2015-11-25 LAB — HM MAMMOGRAPHY

## 2015-11-27 LAB — FECAL OCCULT BLOOD, GUAIAC: FECAL OCCULT BLD: NEGATIVE

## 2015-11-29 ENCOUNTER — Encounter: Payer: Self-pay | Admitting: Internal Medicine

## 2015-12-09 ENCOUNTER — Telehealth: Payer: Self-pay | Admitting: Genetic Counselor

## 2015-12-09 NOTE — Telephone Encounter (Signed)
Discussed with [name] that her genetic test results were negative for mutations within any of 32 genes that would cause her to be at an increased genetic risk for colon, breast, ovarian, and other related cancers.  Additionally, no uncertain changes were found.  Discussed that, even though we have not explained her history of colon cancer, that most cancers are not genetic and that her family history is also reassuring because we don't know of any other family members ever having colon cancer.  Discussed that her brother, niece, and maternal cousins are still eligible for genetic testing, due to their aunt having breast cancer in her 8s (as there could still .  She will let her family members know that this is an option for them.  Advised her brother and children to continue to get their colonoscopies.  Stephanie Pham should continue to follow her doctors' recommendations for future cancer screening.  She knows she is welcome to call with any questions.  I will mail her a copy of her test results.

## 2015-12-12 ENCOUNTER — Other Ambulatory Visit: Payer: Self-pay | Admitting: Internal Medicine

## 2015-12-12 ENCOUNTER — Ambulatory Visit: Payer: Self-pay | Admitting: Genetic Counselor

## 2015-12-12 DIAGNOSIS — Z803 Family history of malignant neoplasm of breast: Secondary | ICD-10-CM

## 2015-12-12 DIAGNOSIS — Z860100 Personal history of colon polyps, unspecified: Secondary | ICD-10-CM

## 2015-12-12 DIAGNOSIS — Z85038 Personal history of other malignant neoplasm of large intestine: Secondary | ICD-10-CM

## 2015-12-12 DIAGNOSIS — Z809 Family history of malignant neoplasm, unspecified: Secondary | ICD-10-CM

## 2015-12-12 DIAGNOSIS — Z1379 Encounter for other screening for genetic and chromosomal anomalies: Secondary | ICD-10-CM

## 2015-12-14 ENCOUNTER — Encounter: Payer: Self-pay | Admitting: Internal Medicine

## 2015-12-26 DIAGNOSIS — Z1379 Encounter for other screening for genetic and chromosomal anomalies: Secondary | ICD-10-CM | POA: Insufficient documentation

## 2015-12-26 NOTE — Progress Notes (Signed)
GENETIC TEST RESULT  HPI: [name] was previously seen in the [facility] due to a personal history of colon cancer at age 65, family history of early-onset breast cancer, and concerns regarding a hereditary predisposition to cancer. Please refer to our prior cancer genetics clinic note from November 24, 2015 for more information regarding [name]'s medical, social and family histories, and our assessment and recommendations, at the time. [name] recent genetic test results were disclosed to her, as were recommendations warranted by these results. These results and recommendations are discussed in more detail below.  GENETIC TEST RESULTS: At the time of [name]'s visit on 11/24/15, we recommended she pursue genetic testing of the 32-gene Comprehensive Cancer Panel with MSH2 Exons 1-7 Inversion Analysis through [facility]. The Comprehensive Cancer Panel offered by [facility] Junius Roads, MD) includes sequencing and/or deletion duplication testing of the following 32 genes: APC, ATM, AXIN2, BARD1, BMPR1A, BRCA1, BRCA2, BRIP1, CDH1, CDK4, CDKN2A, CHEK2, EPCAM, FANCC, MLH1, MSH2, MSH6, MUTYH, NBN, PALB2, PMS2, POLD1, POLE, PTEN, RAD51C, RAD51D, SCG5/GREM1, SMAD4, STK11, TP53, VHL, and XRCC2.  Those results are now back, the report date for which is December 07, 2015.  Genetic testing was normal, and did not reveal a deleterious mutation in these genes. Additionally, no variants of uncertain significance (VUSes) were found.  The test report will be scanned into EPIC and will be located under the Results Review tab in the Pathology>Molecular Pathology section.   We discussed with [name] that since the current genetic testing is not perfect, it is possible there may be a gene mutation in one of these genes that current testing cannot detect, but that chance is small. We also discussed, that it is possible that another gene that has not yet been  discovered, or that we have not yet tested, is responsible for the cancer diagnoses in the family, and it is, therefore, important to remain in touch with cancer genetics in the future so that we can continue to offer [name] the most up-to-date genetic testing.   CANCER SCREENING RECOMMENDATIONS: This result is reassuring and indicates that [name] likely does not have an increased risk for a future cancer due to a mutation in one of these genes. This normal test also suggests that [name]'s cancer was most likely not due to an inherited predisposition associated with one of these genes.  Additionally many of [name]'s relatives have lived to later ages and have never been diagnosed with cancer.  Most cancers happen by chance and this negative test suggests that her cancer falls into this category.  We, therefore, recommended she continue to follow the cancer management and screening guidelines provided by her oncology and primary healthcare providers.   RECOMMENDATIONS FOR FAMILY MEMBERS: Men and women in this family might be at some increased risk of developing colon cancer, over the general population risk, simply due to the family history of colon cancer. We recommended women in this family have a yearly mammogram beginning at age 82, or 18 years younger than the earliest onset of cancer, an annual clinical breast exam, and perform monthly breast self-exams. Women in this family should also have a gynecological exam as recommended by their primary provider. All family members should have a colonoscopy by age 63, or, for [name]'s close relatives, likely much earlier due to her history of colon cancer at 72.  Her brother and children are getting their colonoscopies, and we discussed that they should definitely  still continue this.  Based on [name]'s family history, we recommended her brother and also her maternal relatives, have genetic counseling and testing due to her maternal  aunt's history of breast cancer in her 81s (as there is a chance there could still be a mutation in the family explaining this history that [name] herself just did not inherit). [name] will let us know if we can be of any assistance in coordinating genetic counseling and/or testing for these family members.   FOLLOW-UP: Lastly, we discussed with [name] that cancer genetics is a rapidly advancing field and it is possible that new genetic tests will be appropriate for her and/or her family members in the future. We encouraged her to remain in contact with cancer genetics on an annual basis so we can update her personal and family histories and let her know of advances in cancer genetics that may benefit this family.   Our contact number was provided. [name] questions were answered to her satisfaction, and she knows she is welcome to call us at anytime with additional questions or concerns.   Jeanine Luz, MS, [facility] Certified Genetic Counselor [email or web address] Phone: [phone]

## 2015-12-27 ENCOUNTER — Ambulatory Visit (INDEPENDENT_AMBULATORY_CARE_PROVIDER_SITE_OTHER): Payer: Medicare Other | Admitting: Internal Medicine

## 2015-12-27 ENCOUNTER — Encounter: Payer: Self-pay | Admitting: Internal Medicine

## 2015-12-27 ENCOUNTER — Other Ambulatory Visit (INDEPENDENT_AMBULATORY_CARE_PROVIDER_SITE_OTHER): Payer: Medicare Other

## 2015-12-27 VITALS — BP 130/78 | HR 84 | Temp 98.1°F | Resp 16 | Ht 64.0 in | Wt 233.0 lb

## 2015-12-27 DIAGNOSIS — K760 Fatty (change of) liver, not elsewhere classified: Secondary | ICD-10-CM

## 2015-12-27 DIAGNOSIS — R739 Hyperglycemia, unspecified: Secondary | ICD-10-CM | POA: Diagnosis not present

## 2015-12-27 DIAGNOSIS — R0609 Other forms of dyspnea: Secondary | ICD-10-CM | POA: Diagnosis not present

## 2015-12-27 DIAGNOSIS — E781 Pure hyperglyceridemia: Secondary | ICD-10-CM

## 2015-12-27 DIAGNOSIS — R945 Abnormal results of liver function studies: Secondary | ICD-10-CM

## 2015-12-27 DIAGNOSIS — Z23 Encounter for immunization: Secondary | ICD-10-CM | POA: Diagnosis not present

## 2015-12-27 DIAGNOSIS — R7989 Other specified abnormal findings of blood chemistry: Secondary | ICD-10-CM

## 2015-12-27 DIAGNOSIS — I10 Essential (primary) hypertension: Secondary | ICD-10-CM | POA: Diagnosis not present

## 2015-12-27 DIAGNOSIS — M810 Age-related osteoporosis without current pathological fracture: Secondary | ICD-10-CM

## 2015-12-27 LAB — BASIC METABOLIC PANEL
BUN: 12 mg/dL (ref 6–23)
CHLORIDE: 101 meq/L (ref 96–112)
CO2: 28 mEq/L (ref 19–32)
Calcium: 9.7 mg/dL (ref 8.4–10.5)
Creatinine, Ser: 0.8 mg/dL (ref 0.40–1.20)
GFR: 76.26 mL/min (ref >60.00)
Glucose, Bld: 113 mg/dL — ABNORMAL HIGH (ref 70–99)
POTASSIUM: 3.5 meq/L (ref 3.5–5.1)
SODIUM: 141 meq/L (ref 135–145)

## 2015-12-27 LAB — HEMOGLOBIN A1C: HEMOGLOBIN A1C: 5.5 % (ref 4.6–6.5)

## 2015-12-27 LAB — TRIGLYCERIDES: TRIGLYCERIDES: 312 mg/dL — AB (ref 0.0–149.0)

## 2015-12-27 MED ORDER — FOSTEUM PLUS PO CAPS: 1.0000 | ORAL_CAPSULE | Freq: Two times a day (BID) | ORAL | 11 refills | Status: DC

## 2015-12-27 NOTE — Progress Notes (Signed)
Subjective:  Patient ID: Stephanie Pham, female    DOB: 14-Oct-1950  Age: 65 y.o. MRN: [id number]  CC: Shortness of Breath   HPI Stephanie Pham presents for concerns about chronic DOE. She's had this for many years and says it has not been worsening. She has decided to see a cardiologist and has made contact with her husband's cardiologist, Dr. Harrington Challenger, and she wants me to make a referral in that direction. She denies chest pain, palpitations, fatigue, cough, hemoptysis, edema, or near-syncope.  Outpatient Medications Prior to Visit  Medication Sig Dispense Refill  . atenolol (TENORMIN) 50 MG tablet Take 1 tablet by mouth  daily 90 tablet 2  . Cholecalciferol 2000 units TABS Take 1 tablet (2,000 Units total) by mouth daily. 90 tablet 3  . clorazepate (TRANXENE) 7.5 MG tablet TAKE 1 TABLET BY MOUTH 4 TIMES A DAY 360 tablet 0  . fosinopril-hydrochlorothiazide (MONOPRIL-HCT) 20-12.5 MG tablet Take 1 tablet by mouth  daily 90 tablet 1  . simvastatin (ZOCOR) 10 MG tablet Take 1 tablet by mouth at  bedtime 90 tablet 1  . acetaminophen (TYLENOL) 325 MG tablet Take 650 mg by mouth every 6 (six) hours as needed.    . Multiple Vitamins-Minerals (ICAPS AREDS 2) CAPS Take by mouth 2 (two) times daily.    . Dietary Management Product (VASCULERA) TABS Take 1 capsule by mouth daily. 30 tablet 11  . Icosapent Stephanie 1 g CAPS Take 2 capsules by mouth 2 (two) times daily. (Patient not taking: Reported on 10/31/2015) 360 capsule 3   No facility-administered medications prior to visit.     ROS Review of Systems  Constitutional: Negative for activity change, appetite change, diaphoresis, fatigue, fever and unexpected weight change.  HENT: Negative.  Negative for trouble swallowing and voice change.   Eyes: Negative.  Negative for visual disturbance.  Respiratory: Positive for shortness of breath. Negative for cough, choking, chest tightness, wheezing and stridor.   Cardiovascular: Negative.  Negative for  chest pain, palpitations and leg swelling.  Gastrointestinal: Negative for abdominal pain, constipation, diarrhea, nausea and vomiting.  Endocrine: Negative.   Genitourinary: Negative.  Negative for difficulty urinating.  Musculoskeletal: Negative.  Negative for arthralgias, back pain, myalgias and neck pain.  Skin: Negative.   Allergic/Immunologic: Negative.   Neurological: Negative.  Negative for dizziness, weakness and headaches.  Hematological: Negative.  Negative for adenopathy. Does not bruise/bleed easily.  Psychiatric/Behavioral: Negative.     Objective:  BP 130/78 (BP Location: Left Arm, Patient Position: Sitting, Cuff Size: Large)   Pulse 84   Temp 98.1 F (36.7 C) (Oral)   Resp 16   Ht 5\' 4"  (1.626 m)   Wt 233 lb (105.7 kg)   SpO2 95%   BMI 39.99 kg/m   BP Readings from Last 3 Encounters:  12/27/15 130/78  10/31/15 (!) 160/80  07/05/15 134/84    Wt Readings from Last 3 Encounters:  12/27/15 233 lb (105.7 kg)  10/31/15 235 lb (106.6 kg)  07/05/15 233 lb (105.7 kg)    Physical Exam  Constitutional: She is oriented to person, place, and time. No distress.  HENT:  Mouth/Throat: Oropharynx is clear and moist. No oropharyngeal exudate.  Eyes: Conjunctivae are normal. Right eye exhibits no discharge. Left eye exhibits no discharge. No scleral icterus.  Neck: Normal range of motion. Neck supple. No JVD present. No tracheal deviation present. No thyromegaly present.  Cardiovascular: Normal rate, regular rhythm, normal heart sounds and intact distal pulses.  Exam reveals no  gallop.   No murmur heard. EKG ---  Sinus  Rhythm  WITHIN NORMAL LIMITS  Pulmonary/Chest: Effort normal and breath sounds normal. No stridor. No respiratory distress. She has no wheezes. She has no rales. She exhibits no tenderness.  Abdominal: Soft. Bowel sounds are normal. She exhibits no distension and no mass. There is no tenderness. There is no rebound and no guarding.  Musculoskeletal:  Normal range of motion. She exhibits no edema, tenderness or deformity.  Lymphadenopathy:    She has no cervical adenopathy.  Neurological: She is oriented to person, place, and time.  Skin: Skin is warm and dry. No rash noted. She is not diaphoretic. No erythema. No pallor.  Psychiatric: She has a normal mood and affect. Her behavior is normal. Judgment and thought content normal.  Vitals reviewed.   Lab Results  Component Value Date   WBC 5.7 06/27/2015   HGB 13.0 06/27/2015   HCT 38.5 06/27/2015   PLT 257.0 06/27/2015   GLUCOSE 113 (H) 12/27/2015   CHOL 198 10/03/2015   TRIG 312.0 (H) 12/27/2015   HDL 43.50 10/03/2015   LDLDIRECT 84.0 10/03/2015   ALT 24 06/27/2015   AST 23 06/27/2015   NA 141 12/27/2015   K 3.5 12/27/2015   CL 101 12/27/2015   CREATININE 0.80 12/27/2015   BUN 12 12/27/2015   CO2 28 12/27/2015   TSH 2.10 06/27/2015   HGBA1C 5.5 12/27/2015    No results found.  Assessment & Plan:   Hade was seen today for shortness of breath.  Diagnoses and all orders for this visit:  Essential hypertension- her blood pressure is well controlled, electrolytes and renal function are stable. -     Basic metabolic panel; Future  Hypertriglyceridemia- improvement noted, she will continue to work on her lifestyle modifications. -     Triglycerides; Future  Fatty liver disease, nonalcoholic- improvement noted -     Hepatitis C antibody; Future  Hyperglycemia- her A1c is down to 5.5%, improvement noted with lifestyle modifications, she is prediabetic and does not need any medications at this time. -     Basic metabolic panel; Future -     Hemoglobin A1c; Future  Senile osteoporosis- will start treating this with a vitamin supplement they can increase bone density by about 3% per year -     Dietary Management Product (FOSTEUM PLUS) CAPS; Take 1 capsule by mouth 2 (two) times daily.  Elevated LFTs- she has negative for hepatitis C infection, this is most likely  related to fatty liver disease, this has improved recently with lifestyle modifications. -     Hepatitis C antibody; Future  DOE (dyspnea on exertion)- her exam and EKG are normal, I think this is most likely caused by poor conditioning and obesity, will refer to cardiology at her request -     EKG 12-Lead -     Ambulatory referral to Cardiology   I have discontinued Ms. Smolinsky's acetaminophen, ICAPS AREDS 2, VASCULERA, and Icosapent Stephanie. I am also having her start on Forgan. Additionally, I am having her maintain her Cholecalciferol, atenolol, simvastatin, fosinopril-hydrochlorothiazide, clorazepate, and Krill Oil.  Meds ordered this encounter  Medications  . Krill Oil 300 MG CAPS    Sig: Take 600 mg by mouth.  . Dietary Management Product (FOSTEUM PLUS) CAPS    Sig: Take 1 capsule by mouth 2 (two) times daily.    Dispense:  60 capsule    Refill:  11     Follow-up: Return in  about 6 months (around 06/26/2016).  Scarlette Calico, MD

## 2015-12-27 NOTE — Progress Notes (Signed)
Pre visit review using our clinic review tool, if applicable. No additional management support is needed unless otherwise documented below in the visit note. 

## 2015-12-27 NOTE — Patient Instructions (Signed)

## 2015-12-28 ENCOUNTER — Encounter: Payer: Self-pay | Admitting: Internal Medicine

## 2015-12-28 LAB — HEPATITIS C ANTIBODY: HCV AB: NEGATIVE

## 2016-02-06 ENCOUNTER — Ambulatory Visit (INDEPENDENT_AMBULATORY_CARE_PROVIDER_SITE_OTHER): Payer: Medicare Other | Admitting: Internal Medicine

## 2016-02-06 ENCOUNTER — Encounter: Payer: Self-pay | Admitting: Internal Medicine

## 2016-02-06 VITALS — BP 110/62 | HR 71 | Ht 64.0 in | Wt 231.4 lb

## 2016-02-06 DIAGNOSIS — R0602 Shortness of breath: Secondary | ICD-10-CM

## 2016-02-06 NOTE — Patient Instructions (Signed)

## 2016-02-06 NOTE — Progress Notes (Signed)
Cardiology Office Note   Date:  02/06/2016   ID:  Stephanie Pham, DOB 07-21-1950, MRN [id number]  PCP:  Scarlette Calico, MD  Cardiologist:   Dorris Carnes, MD   Pt self referred for SOB     History of Present Illness: Stephanie Pham is a 65 y.o. female with a history of SOB   Longstanding  SOB with activity  No real worsening  No CP  No dizziness.  No palpitatoins   Followed in IM  HTN       Outpatient Medications Prior to Visit  Medication Sig Dispense Refill  . atenolol (TENORMIN) 50 MG tablet Take 1 tablet by mouth  daily 90 tablet 2  . Cholecalciferol 2000 units TABS Take 1 tablet (2,000 Units total) by mouth daily. 90 tablet 3  . fosinopril-hydrochlorothiazide (MONOPRIL-HCT) 20-12.5 MG tablet Take 1 tablet by mouth  daily 90 tablet 1  . Krill Oil 300 MG CAPS Take 600 mg by mouth.    . simvastatin (ZOCOR) 10 MG tablet Take 1 tablet by mouth at  bedtime 90 tablet 1  . clorazepate (TRANXENE) 7.5 MG tablet TAKE 1 TABLET BY MOUTH 4 TIMES A DAY 360 tablet 0  . Dietary Management Product (FOSTEUM PLUS) CAPS Take 1 capsule by mouth 2 (two) times daily. (Patient not taking: Reported on 02/06/2016) 60 capsule 11   No facility-administered medications prior to visit.      Allergies:   Iohexol; Ivp dye [iodinated diagnostic agents]; Minocycline; and Sulfonamide derivatives   Past Medical History:  Diagnosis Date  . Chronic anxiety   . Chronic depression   . Diverticulosis of colon   . Dyslipidemia   . Hypertension   . Internal hemorrhoid     Past Surgical History:  Procedure Laterality Date  . ABDOMINAL HYSTERECTOMY    . CATARACT EXTRACTION     left eye 05-13-2013  . CHOLECYSTECTOMY    . laparotomy and removal of polyp    . sigmoid colectomy       Social History:  The patient  reports that she has never smoked. She has never used smokeless tobacco. She reports that she does not drink alcohol or use drugs.   Family History:  The patient's family history includes  AAA (abdominal aortic aneurysm) in her paternal aunt; Bipolar disorder in her brother and other; Breast cancer in her maternal aunt; COPD in her brother; Cancer in her cousin; Diabetes in her brother, father, paternal aunt, paternal grandmother, and paternal uncle; Endometriosis in her daughter; Fibromyalgia in her mother; Heart attack in her brother; Heart attack (age of onset: 37) in her father; Heart disease in her father; Hypertension in her mother; Kidney disease in her brother; Other in her daughter, maternal aunt, maternal grandfather, mother, and paternal grandmother; Ovarian cysts in her mother; Skin cancer in her brother; Stroke in her maternal grandfather, paternal aunt, paternal aunt, and paternal grandfather.    ROS:  Please see the history of present illness. All other systems are reviewed and  Negative to the above problem except as noted.    PHYSICAL EXAM: VS:  BP 110/62   Pulse 71   Ht 5\' 4"  (1.626 m)   Wt 231 lb 6.4 oz (105 kg)   SpO2 96%   BMI 39.72 kg/m   GEN: Well nourished, well developed, in no acute distress  HEENT: normal  Neck: no JVD, carotid bruits, or masses Cardiac: RRR; no murmurs, rubs, or gallops,no edema  Respiratory:  clear to auscultation bilaterally,  normal work of breathing GI: soft, nontender, nondistended, + BS  No hepatomegaly  MS: no deformity Moving all extremities   Skin: warm and dry, no rash Neuro:  Strength and sensation are intact Psych: euthymic mood, full affect   EKG:  EKG is not  ordered today.  On 10.10   SR 73 bpm     Lipid Panel    Component Value Date/Time   CHOL 198 10/03/2015 0846   TRIG 312.0 (H) 12/27/2015 0856   HDL 43.50 10/03/2015 0846   CHOLHDL 5 10/03/2015 0846   VLDL 76.8 (H) 10/03/2015 0846   LDLDIRECT 84.0 10/03/2015 0846      Wt Readings from Last 3 Encounters:  02/06/16 231 lb 6.4 oz (105 kg)  12/27/15 233 lb (105.7 kg)  10/31/15 235 lb (106.6 kg)      ASSESSMENT AND PLAN:  1  Dyspnea  Pt with long  standing history  Exam:  Moving air well I would recomm an echo to eval diastolic funciton    I am not conivnced that this represents an angina equivalent  SHe is not that active  May represent deconditioning in setting of obesity  2  HTN  Adequate control  Follow  No change in meds    F/U based on test results      Current medicines are reviewed at length with the patient today.  The patient does not have concerns regarding medicines.  Signed, Dorris Carnes, MD  02/06/2016 11:46 AM    [address], [place], [place]  [address] Phone: [phone]; Fax: [phone]

## 2016-03-14 ENCOUNTER — Other Ambulatory Visit: Payer: Self-pay | Admitting: Internal Medicine

## 2016-03-14 DIAGNOSIS — I10 Essential (primary) hypertension: Secondary | ICD-10-CM

## 2016-03-16 ENCOUNTER — Other Ambulatory Visit: Payer: Self-pay | Admitting: Internal Medicine

## 2016-03-26 ENCOUNTER — Ambulatory Visit (HOSPITAL_COMMUNITY): Payer: Medicare Other | Attending: Cardiology

## 2016-03-26 ENCOUNTER — Other Ambulatory Visit: Payer: Self-pay

## 2016-03-26 DIAGNOSIS — I340 Nonrheumatic mitral (valve) insufficiency: Secondary | ICD-10-CM | POA: Insufficient documentation

## 2016-03-26 DIAGNOSIS — R0602 Shortness of breath: Secondary | ICD-10-CM | POA: Insufficient documentation

## 2016-03-30 ENCOUNTER — Telehealth: Payer: Self-pay | Admitting: Emergency Medicine

## 2016-03-30 MED ORDER — CLORAZEPATE DIPOTASSIUM 7.5 MG PO TABS: 7.5000 mg | ORAL_TABLET | Freq: Four times a day (QID) | ORAL | 0 refills | Status: DC | PRN

## 2016-03-30 NOTE — Telephone Encounter (Signed)
Pt called and wants to know if she can get a refill on her prescription clorazepate (TRANXENE) 7.5 MG tablet. Please advise thanks.

## 2016-03-30 NOTE — Telephone Encounter (Signed)
Per [place] narcotic database Dr. Ronnald Ramp has been filling for #360 which appears to be 3-4 month supply. Will order order #120 with 0 refills.

## 2016-04-02 ENCOUNTER — Other Ambulatory Visit: Payer: Self-pay | Admitting: Internal Medicine

## 2016-04-02 MED ORDER — CLORAZEPATE DIPOTASSIUM 7.5 MG PO TABS: 7.5000 mg | ORAL_TABLET | Freq: Four times a day (QID) | ORAL | 1 refills | Status: DC | PRN

## 2016-04-02 NOTE — Telephone Encounter (Signed)
Pt informed on Friday that rx was written for a 30 day supply. Pt wanted me to ask if she could have a 90 day and send to mail order.

## 2016-04-02 NOTE — Telephone Encounter (Signed)
RX written 

## 2016-04-05 NOTE — Telephone Encounter (Signed)
rx faxed [facility].

## 2016-04-12 ENCOUNTER — Telehealth: Payer: Self-pay | Admitting: Internal Medicine

## 2016-04-12 NOTE — Telephone Encounter (Signed)
-----   Message from Fay Records, MD sent at 03/27/2016 11:46 PM EST ----- Normal pumping and relaxing function of the hear.  Normal valve function Does not explain SOB I would recomm pt get in an exercise routine  Increase walking  Should help

## 2016-04-12 NOTE — Telephone Encounter (Signed)
Patient made aware of results. Patient verbalizes understanding.  

## 2016-04-12 NOTE — Telephone Encounter (Signed)
F/u Message ° °Pt returning RN call. Please call back to discuss  °

## 2016-05-18 ENCOUNTER — Other Ambulatory Visit: Payer: Self-pay | Admitting: Internal Medicine

## 2016-05-18 DIAGNOSIS — I10 Essential (primary) hypertension: Secondary | ICD-10-CM

## 2016-06-20 ENCOUNTER — Encounter (HOSPITAL_COMMUNITY): Payer: Self-pay

## 2016-08-05 ENCOUNTER — Other Ambulatory Visit: Payer: Self-pay | Admitting: Internal Medicine

## 2016-08-07 DIAGNOSIS — L853 Xerosis cutis: Secondary | ICD-10-CM | POA: Diagnosis not present

## 2016-08-07 DIAGNOSIS — L814 Other melanin hyperpigmentation: Secondary | ICD-10-CM | POA: Diagnosis not present

## 2016-08-07 DIAGNOSIS — H5213 Myopia, bilateral: Secondary | ICD-10-CM | POA: Diagnosis not present

## 2016-08-07 DIAGNOSIS — L821 Other seborrheic keratosis: Secondary | ICD-10-CM | POA: Diagnosis not present

## 2016-08-07 DIAGNOSIS — H353132 Nonexudative age-related macular degeneration, bilateral, intermediate dry stage: Secondary | ICD-10-CM | POA: Diagnosis not present

## 2016-08-07 DIAGNOSIS — D225 Melanocytic nevi of trunk: Secondary | ICD-10-CM | POA: Diagnosis not present

## 2016-08-07 DIAGNOSIS — Z961 Presence of intraocular lens: Secondary | ICD-10-CM | POA: Diagnosis not present

## 2016-08-22 ENCOUNTER — Telehealth: Payer: Self-pay

## 2016-08-22 NOTE — Telephone Encounter (Signed)
Appt scheduled for Monday 08/27/2016

## 2016-08-22 NOTE — Telephone Encounter (Signed)
Refill rq for clorazepate to [facility]  LOV 12/2015. Was suppose to follow up in 6 months (06/2016). No appts made at this time.

## 2016-08-22 NOTE — Telephone Encounter (Signed)
She needs to be seen.

## 2016-08-27 ENCOUNTER — Ambulatory Visit: Payer: Medicare Other | Admitting: Internal Medicine

## 2016-08-27 ENCOUNTER — Encounter: Payer: Self-pay | Admitting: Internal Medicine

## 2016-08-28 ENCOUNTER — Ambulatory Visit (INDEPENDENT_AMBULATORY_CARE_PROVIDER_SITE_OTHER): Payer: Medicare Other | Admitting: Internal Medicine

## 2016-08-28 ENCOUNTER — Encounter: Payer: Self-pay | Admitting: Internal Medicine

## 2016-08-28 VITALS — BP 120/70 | HR 61 | Temp 98.8°F | Ht 64.0 in | Wt 228.8 lb

## 2016-08-28 DIAGNOSIS — D892 Hypergammaglobulinemia, unspecified: Secondary | ICD-10-CM

## 2016-08-28 DIAGNOSIS — K760 Fatty (change of) liver, not elsewhere classified: Secondary | ICD-10-CM

## 2016-08-28 DIAGNOSIS — E781 Pure hyperglyceridemia: Secondary | ICD-10-CM

## 2016-08-28 DIAGNOSIS — F411 Generalized anxiety disorder: Secondary | ICD-10-CM

## 2016-08-28 DIAGNOSIS — E785 Hyperlipidemia, unspecified: Secondary | ICD-10-CM

## 2016-08-28 DIAGNOSIS — I10 Essential (primary) hypertension: Secondary | ICD-10-CM | POA: Diagnosis not present

## 2016-08-28 DIAGNOSIS — N3281 Overactive bladder: Secondary | ICD-10-CM

## 2016-08-28 MED ORDER — CLORAZEPATE DIPOTASSIUM 7.5 MG PO TABS: 7.5000 mg | ORAL_TABLET | Freq: Four times a day (QID) | ORAL | 1 refills | Status: DC | PRN

## 2016-08-28 MED ORDER — MIRABEGRON ER 25 MG PO TB24: 25.0000 mg | ORAL_TABLET | Freq: Every day | ORAL | 3 refills | Status: DC

## 2016-08-28 NOTE — Patient Instructions (Signed)

## 2016-08-28 NOTE — Progress Notes (Addendum)
Subjective:  Patient ID: Stephanie Pham, female    DOB: February 01, 1951  Age: 66 y.o. MRN: [id number]  CC: Hypertension and Hyperlipidemia   HPI Stephanie Pham presents for f/up - She complains of urinary frequency, urgency, and a few episodes of near incontinence. This has been worsening over the last few months. She also complains of chronic anxiety and wants a refill on Tranxene. She otherwise feels well and offers no other complaints.  Outpatient Medications Prior to Visit  Medication Sig Dispense Refill  . acetaminophen (TYLENOL) 500 MG tablet Take 500 mg by mouth every 6 (six) hours as needed.    Marland Kitchen atenolol (TENORMIN) 50 MG tablet TAKE 1 TABLET BY MOUTH  DAILY 90 tablet 0  . Cholecalciferol 2000 units TABS Take 1 tablet (2,000 Units total) by mouth daily. 90 tablet 3  . fosinopril-hydrochlorothiazide (MONOPRIL-HCT) 20-12.5 MG tablet TAKE 1 TABLET BY MOUTH  DAILY 90 tablet 1  . Krill Oil 300 MG CAPS Take 600 mg by mouth.    . simvastatin (ZOCOR) 10 MG tablet TAKE 1 TABLET BY MOUTH AT  BEDTIME 90 tablet 1  . clorazepate (TRANXENE) 7.5 MG tablet Take 1 tablet (7.5 mg total) by mouth 4 (four) times daily as needed for anxiety. 360 tablet 1  . Multiple Vitamins-Minerals (ICAPS AREDS 2 PO) Take by mouth daily.     No facility-administered medications prior to visit.     ROS Review of Systems  Constitutional: Negative.  Negative for chills, diaphoresis, fatigue and unexpected weight change.  HENT: Negative.  Negative for trouble swallowing.   Eyes: Negative for visual disturbance.  Respiratory: Negative.  Negative for cough, chest tightness, shortness of breath and wheezing.   Cardiovascular: Negative.  Negative for chest pain, palpitations and leg swelling.  Gastrointestinal: Negative for abdominal pain, constipation, diarrhea, nausea and vomiting.  Endocrine: Positive for polyuria. Negative for polydipsia and polyphagia.  Genitourinary: Positive for frequency and urgency. Negative  for decreased urine volume, difficulty urinating, dysuria, flank pain, hematuria and vaginal pain.  Musculoskeletal: Negative.   Skin: Negative.   Neurological: Negative.  Negative for dizziness, weakness, light-headedness and headaches.  Hematological: Negative for adenopathy. Does not bruise/bleed easily.  Psychiatric/Behavioral: Negative for agitation, decreased concentration, dysphoric mood, self-injury and sleep disturbance. The patient is nervous/anxious.     Objective:  BP 120/70 (BP Location: Left Arm, Patient Position: Sitting, Cuff Size: Large)   Pulse 61   Temp 98.8 F (37.1 C) (Oral)   Ht 5\' 4"  (1.626 m)   Wt 228 lb 12 oz (103.8 kg)   SpO2 97%   BMI 39.26 kg/m   BP Readings from Last 3 Encounters:  08/28/16 120/70  02/06/16 110/62  12/27/15 130/78    Wt Readings from Last 3 Encounters:  08/28/16 228 lb 12 oz (103.8 kg)  02/06/16 231 lb 6.4 oz (105 kg)  12/27/15 233 lb (105.7 kg)    Physical Exam  Constitutional: She is oriented to person, place, and time. No distress.  HENT:  Mouth/Throat: Oropharynx is clear and moist. No oropharyngeal exudate.  Eyes: Conjunctivae are normal. Right eye exhibits no discharge. Left eye exhibits no discharge. No scleral icterus.  Neck: Normal range of motion. Neck supple. No JVD present. No thyromegaly present.  Cardiovascular: Normal rate, regular rhythm and intact distal pulses.  Exam reveals no gallop and no friction rub.   No murmur heard. Pulmonary/Chest: Effort normal and breath sounds normal. No respiratory distress. She has no wheezes. She has no rales.  Abdominal:  Soft. Bowel sounds are normal. She exhibits no distension and no mass. There is no tenderness. There is no rebound and no guarding.  Musculoskeletal: Normal range of motion. She exhibits no edema, tenderness or deformity.  Lymphadenopathy:    She has no cervical adenopathy.  Neurological: She is alert and oriented to person, place, and time.  Skin: Skin is  warm and dry. No rash noted. She is not diaphoretic. No erythema. No pallor.  Vitals reviewed.   Lab Results  Component Value Date   WBC 5.7 06/27/2015   HGB 13.0 06/27/2015   HCT 38.5 06/27/2015   PLT 257.0 06/27/2015   GLUCOSE 113 (H) 12/27/2015   CHOL 198 10/03/2015   TRIG 312.0 (H) 12/27/2015   HDL 43.50 10/03/2015   LDLDIRECT 84.0 10/03/2015   ALT 24 06/27/2015   AST 23 06/27/2015   NA 141 12/27/2015   K 3.5 12/27/2015   CL 101 12/27/2015   CREATININE 0.80 12/27/2015   BUN 12 12/27/2015   CO2 28 12/27/2015   TSH 2.10 06/27/2015   HGBA1C 5.5 12/27/2015    No results found.  Assessment & Plan:   Stephanie Pham was seen today for hypertension and hyperlipidemia.  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 -     mirabegron ER (MYRBETRIQ) 25 MG TB24 tablet; Take 1 tablet (25 mg total) by mouth daily. -     Urinalysis, Routine w reflex microscopic; Future -     CULTURE, URINE COMPREHENSIVE; Future  GAD (generalized anxiety disorder) -     clorazepate (TRANXENE) 7.5 MG tablet; Take 1 tablet (7.5 mg total) by mouth 4 (four) times daily as needed for anxiety.   I have  discontinued Stephanie Pham's Multiple Vitamins-Minerals (ICAPS AREDS 2 PO). I am also having her start on mirabegron ER. Additionally, I am having her maintain her Cholecalciferol, Krill Oil, acetaminophen, fosinopril-hydrochlorothiazide, simvastatin, atenolol, aspirin, and clorazepate.  Meds ordered this encounter  Medications  . aspirin (BAYER LOW DOSE) 81 MG EC tablet  . mirabegron ER (MYRBETRIQ) 25 MG TB24 tablet    Sig: Take 1 tablet (25 mg total) by mouth daily.    Dispense:  90 tablet    Refill:  3  . clorazepate (TRANXENE) 7.5 MG tablet    Sig: Take 1 tablet (7.5 mg total) by mouth 4 (four) times daily as needed for anxiety.    Dispense:  360 tablet    Refill:  1     Follow-up: Return in about 6 months (around 02/27/2017).  Scarlette Calico, MD

## 2016-09-13 ENCOUNTER — Encounter (HOSPITAL_COMMUNITY): Payer: Self-pay

## 2016-10-09 ENCOUNTER — Other Ambulatory Visit: Payer: Self-pay | Admitting: Internal Medicine

## 2016-10-09 ENCOUNTER — Other Ambulatory Visit (INDEPENDENT_AMBULATORY_CARE_PROVIDER_SITE_OTHER): Payer: Medicare Other

## 2016-10-09 DIAGNOSIS — D892 Hypergammaglobulinemia, unspecified: Secondary | ICD-10-CM | POA: Diagnosis not present

## 2016-10-09 DIAGNOSIS — K760 Fatty (change of) liver, not elsewhere classified: Secondary | ICD-10-CM

## 2016-10-09 DIAGNOSIS — I10 Essential (primary) hypertension: Secondary | ICD-10-CM

## 2016-10-09 DIAGNOSIS — N3281 Overactive bladder: Secondary | ICD-10-CM | POA: Diagnosis not present

## 2016-10-09 DIAGNOSIS — E785 Hyperlipidemia, unspecified: Secondary | ICD-10-CM | POA: Diagnosis not present

## 2016-10-09 DIAGNOSIS — E781 Pure hyperglyceridemia: Secondary | ICD-10-CM | POA: Diagnosis not present

## 2016-10-09 DIAGNOSIS — R7989 Other specified abnormal findings of blood chemistry: Secondary | ICD-10-CM

## 2016-10-09 LAB — BASIC METABOLIC PANEL
BUN: 14 mg/dL (ref 6–23)
CALCIUM: 9.6 mg/dL (ref 8.4–10.5)
CHLORIDE: 103 meq/L (ref 96–112)
CO2: 28 mEq/L (ref 19–32)
CREATININE: 0.79 mg/dL (ref 0.40–1.20)
GFR: 77.19 mL/min (ref >60.00)
Glucose, Bld: 114 mg/dL — ABNORMAL HIGH (ref 70–99)
Potassium: 3.7 mEq/L (ref 3.5–5.1)
SODIUM: 141 meq/L (ref 135–145)

## 2016-10-09 LAB — LDL CHOLESTEROL, DIRECT: LDL DIRECT: 82 mg/dL

## 2016-10-09 LAB — LIPID PANEL
CHOL/HDL RATIO: 4
CHOLESTEROL: 177 mg/dL (ref 0–200)
HDL: 46.6 mg/dL (ref >39.00)
NonHDL: 130.57
TRIGLYCERIDES: 282 mg/dL — AB (ref 0.0–149.0)
VLDL: 56.4 mg/dL — AB (ref 0.0–40.0)

## 2016-10-09 LAB — TSH: TSH: 2.1 u[IU]/mL (ref 0.35–4.50)

## 2016-10-09 LAB — HEPATIC FUNCTION PANEL
ALT: 15 U/L (ref 0–35)
AST: 14 U/L (ref 0–37)
Albumin: 4.2 g/dL (ref 3.5–5.2)
Alkaline Phosphatase: 67 U/L (ref 39–117)
BILIRUBIN DIRECT: 0.1 mg/dL (ref 0.0–0.3)
BILIRUBIN TOTAL: 0.5 mg/dL (ref 0.2–1.2)
Total Protein: 7.6 g/dL (ref 6.0–8.3)

## 2016-10-09 LAB — URINALYSIS, ROUTINE W REFLEX MICROSCOPIC
Bilirubin Urine: NEGATIVE
Hgb urine dipstick: NEGATIVE
Ketones, ur: NEGATIVE
Nitrite: NEGATIVE
RBC / HPF: NONE SEEN (ref 0–?)
SPECIFIC GRAVITY, URINE: 1.025 (ref 1.000–1.030)
Urine Glucose: NEGATIVE
Urobilinogen, UA: 0.2 (ref 0.0–1.0)
pH: 5.5 (ref 5.0–8.0)

## 2016-10-11 LAB — CULTURE, URINE COMPREHENSIVE

## 2016-10-12 ENCOUNTER — Encounter: Payer: Self-pay | Admitting: Internal Medicine

## 2016-10-12 ENCOUNTER — Other Ambulatory Visit: Payer: Self-pay | Admitting: Internal Medicine

## 2016-10-12 DIAGNOSIS — N3289 Other specified disorders of bladder: Secondary | ICD-10-CM

## 2016-10-12 LAB — PROTEIN ELECTROPHORESIS, SERUM
Albumin ELP: 3.9 g/dL (ref 3.8–4.8)
Alpha-1-Globulin: 0.3 g/dL (ref 0.2–0.3)
Alpha-2-Globulin: 0.8 g/dL (ref 0.5–0.9)
Beta 2: 0.5 g/dL (ref 0.2–0.5)
Beta Globulin: 0.5 g/dL (ref 0.4–0.6)
GAMMA GLOBULIN: 1.2 g/dL (ref 0.8–1.7)
Total Protein, Serum Electrophoresis: 7.2 g/dL (ref 6.1–8.1)

## 2016-10-30 ENCOUNTER — Ambulatory Visit (AMBULATORY_SURGERY_CENTER): Payer: Self-pay | Admitting: *Deleted

## 2016-10-30 VITALS — Ht 64.0 in | Wt 229.4 lb

## 2016-10-30 DIAGNOSIS — Z86010 Personal history of colonic polyps: Secondary | ICD-10-CM

## 2016-10-30 DIAGNOSIS — Z85038 Personal history of other malignant neoplasm of large intestine: Secondary | ICD-10-CM

## 2016-10-30 NOTE — Progress Notes (Signed)
No egg or soy allergy known to patient  No issues with past sedation with any surgeries  or procedures, no intubation problems   Only legs jumped after anesthesia and high fever once a long time ago No diet pills per patient No home 02 use per patient  No blood thinners per patient  Pt denies issues with constipation  No A fib or A flutter  EMMI video sent to pt's e mail  Pt. declined

## 2016-10-31 ENCOUNTER — Encounter: Payer: Self-pay | Admitting: Internal Medicine

## 2016-11-13 ENCOUNTER — Ambulatory Visit (AMBULATORY_SURGERY_CENTER): Payer: Medicare Other | Admitting: Internal Medicine

## 2016-11-13 ENCOUNTER — Encounter: Payer: Self-pay | Admitting: Internal Medicine

## 2016-11-13 VITALS — BP 97/51 | HR 54 | Temp 97.5°F | Resp 12 | Ht 64.0 in | Wt 229.0 lb

## 2016-11-13 DIAGNOSIS — Z86010 Personal history of colonic polyps: Secondary | ICD-10-CM | POA: Diagnosis present

## 2016-11-13 DIAGNOSIS — Z85038 Personal history of other malignant neoplasm of large intestine: Secondary | ICD-10-CM | POA: Diagnosis not present

## 2016-11-13 DIAGNOSIS — Z1211 Encounter for screening for malignant neoplasm of colon: Secondary | ICD-10-CM | POA: Diagnosis not present

## 2016-11-13 LAB — HM COLONOSCOPY

## 2016-11-13 MED ORDER — SODIUM CHLORIDE 0.9 % IV SOLN: 500.0000 mL | INTRAVENOUS | Status: DC

## 2016-11-13 NOTE — Patient Instructions (Addendum)
   No polyps or cancer seen.  Your next routine colonoscopy should be in 5 years - 2023.  I appreciate the opportunity to care for you. Gatha Mayer, MD, FACG  YOU HAD AN ENDOSCOPIC PROCEDURE TODAY AT [facility]:   Refer to the procedure report that was given to you for any specific questions about what was found during the examination.  If the procedure report does not answer your questions, please call your gastroenterologist to clarify.  If you requested that your care partner not be given the details of your procedure findings, then the procedure report has been included in a sealed envelope for you to review at your convenience later.  YOU SHOULD EXPECT: Some feelings of bloating in the abdomen. Passage of more gas than usual.  Walking can help get rid of the air that was put into your GI tract during the procedure and reduce the bloating. If you had a lower endoscopy (such as a colonoscopy or flexible sigmoidoscopy) you may notice spotting of blood in your stool or on the toilet paper. If you underwent a bowel prep for your procedure, you may not have a normal bowel movement for a few days.  Please Note:  You might notice some irritation and congestion in your nose or some drainage.  This is from the oxygen used during your procedure.  There is no need for concern and it should clear up in a day or so.  SYMPTOMS TO REPORT IMMEDIATELY:   Following lower endoscopy (colonoscopy or flexible sigmoidoscopy):  Excessive amounts of blood in the stool  Significant tenderness or worsening of abdominal pains  Swelling of the abdomen that is new, acute  Fever of 100F or higher   For urgent or emergent issues, a gastroenterologist can be reached at any hour by calling [phone].   DIET:  We do recommend a small meal at first, but then you may proceed to your regular diet.  Drink plenty of fluids but you should avoid alcoholic beverages for 24 hours.  ACTIVITY:   You should plan to take it easy for the rest of today and you should NOT DRIVE or use heavy machinery until tomorrow (because of the sedation medicines used during the test).    FOLLOW UP: Our staff will call the number listed on your records the next business day following your procedure to check on you and address any questions or concerns that you may have regarding the information given to you following your procedure. If we do not reach you, we will leave a message.  However, if you are feeling well and you are not experiencing any problems, there is no need to return our call.  We will assume that you have returned to your regular daily activities without incident.  If any biopsies were taken you will be contacted by phone or by letter within the next 1-3 weeks.  Please call us at [phone] if you have not heard about the biopsies in 3 weeks.    SIGNATURES/CONFIDENTIALITY: You and/or your care partner have signed paperwork which will be entered into your electronic medical record.  These signatures attest to the fact that that the information above on your After Visit Summary has been reviewed and is understood.  Full responsibility of the confidentiality of this discharge information lies with you and/or your care-partner.  Thank you for letting us take care of your healthcare needs today.

## 2016-11-13 NOTE — Op Note (Signed)
[place] Patient Name: Camylle Whicker Procedure Date: 11/13/2016 8:38 AM MRN: [id number] Endoscopist: Gatha Mayer , MD Age: 66 Referring MD:  Date of Birth: [birth year] Gender: Female Account #: [id number] Procedure:                Colonoscopy Indications:              High risk colon cancer surveillance: Personal                            history of colon cancer, Personal history of                            colonic polyps Medicines:                Propofol per Anesthesia, Monitored Anesthesia Care Procedure:                Pre-Anesthesia Assessment:                           - Prior to the procedure, a History and Physical                            was performed, and patient medications and                            allergies were reviewed. The patient's tolerance of                            previous anesthesia was also reviewed. The risks                            and benefits of the procedure and the sedation                            options and risks were discussed with the patient.                            All questions were answered, and informed consent                            was obtained. Prior Anticoagulants: The patient has                            taken no previous anticoagulant or antiplatelet                            agents. ASA Grade Assessment: III - A patient with                            severe systemic disease. After reviewing the risks                            and benefits, the patient was deemed in  satisfactory condition to undergo the procedure.                           After obtaining informed consent, the colonoscope                            was passed under direct vision. Throughout the                            procedure, the patient's blood pressure, pulse, and                            oxygen saturations were monitored continuously. The                            Model PCF-H190DL  [id number]) scope was introduced                            through the anus and advanced to the the cecum,                            identified by appendiceal orifice and ileocecal                            valve. The colonoscopy was performed without                            difficulty. The patient tolerated the procedure                            well. The quality of the bowel preparation was                            good. The ileocecal valve, appendiceal orifice, and                            rectum were photographed. The bowel preparation                            used was Miralax. Scope In: 8:42:48 AM Scope Out: 8:53:55 AM Scope Withdrawal Time: 0 hours 9 minutes 19 seconds  Total Procedure Duration: 0 hours 11 minutes 7 seconds  Findings:                 The perianal and digital rectal examinations were                            normal.                           There was evidence of a prior end-to-side                            colo-colonic anastomosis in the sigmoid colon. This  was patent and was characterized by healthy                            appearing mucosa. The anastomosis was traversed.                           The exam was otherwise without abnormality on                            direct and retroflexion views. Complications:            No immediate complications. Estimated Blood Loss:     Estimated blood loss: none. Impression:               - Patent end-to-side colo-colonic anastomosis,                            characterized by healthy appearing mucosa.                           - The examination was otherwise normal on direct                            and retroflexion views.                           - No specimens collected. Recommendation:           - Patient has a contact number available for                            emergencies. The signs and symptoms of potential                            delayed complications  were discussed with the                            patient. Return to normal activities tomorrow.                            Written discharge instructions were provided to the                            patient.                           - Resume previous diet.                           - Continue present medications.                           - Repeat colonoscopy in 5 years for surveillance. Gatha Mayer, MD 11/13/2016 8:58:09 AM This report has been signed electronically.

## 2016-11-13 NOTE — Progress Notes (Signed)
5 attempts for IV insertion,  First attempt right hand unsuccessful by Waldo Laine RN Second attempt and third attempts right hand unsuccessful by Alphonsa Gin RN Fourth attempt unsuccessfulb Gaye Pollack CRNA.

## 2016-11-13 NOTE — Progress Notes (Signed)
A and O x3. Report to RN. Tolerated MAC anesthesia well.

## 2016-11-14 ENCOUNTER — Telehealth: Payer: Self-pay

## 2016-11-14 NOTE — Telephone Encounter (Signed)
  Follow up Call-  Call back number 11/13/2016  Post procedure Call Back phone  # [phone]  Permission to leave phone message Yes  Some recent data might be hidden     Patient questions:  Do you have a fever, pain , or abdominal swelling? No. Pain Score  0 *  Have you tolerated food without any problems? Yes.    Have you been able to return to your normal activities? Yes.    Do you have any questions about your discharge instructions: Diet   No. Medications  No. Follow up visit  No.  Do you have questions or concerns about your Care? No.  Actions: * If pain score is 4 or above: No action needed, pain <4.

## 2016-11-15 LAB — HM COLONOSCOPY

## 2016-11-20 NOTE — Progress Notes (Addendum)
Subjective:   Stephanie Pham is a 66 y.o. female who presents for Medicare Annual (Subsequent) preventive examination.  Review of Systems:  No ROS.  Medicare Wellness Visit. Additional risk factors are reflected in the social history.  Cardiac Risk Factors include: advanced age (>22men, >31 women);hypertension Sleep patterns: feels rested on waking, gets up 1 times nightly to void and sleeps 6-7 hours nightly.    Home Safety/Smoke Alarms: Feels safe in home. Smoke alarms in place.  Living environment; residence and Firearm Safety: [place], no firearmsLives with husband, no needs for DME, good support system Seat Belt Safety/Bike Helmet: Wears seat belt.      Objective:     Vitals: BP 126/78   Pulse (!) 54   Resp 20   Ht 5\' 4"  (1.626 m)   Wt 230 lb (104.3 kg)   SpO2 98%   BMI 39.48 kg/m   Body mass index is 39.48 kg/m.   Tobacco History  Smoking Status  . Never Smoker  Smokeless Tobacco  . Never Used     Counseling given: Not Answered   Past Medical History:  Diagnosis Date  . Allergy   . Anemia   . Blood transfusion without reported diagnosis 1976  . Cancer ([facility])    colon cancer 1976  . Cataract    both eyes-surgery  . Chronic anxiety   . Chronic depression   . Diverticulosis of colon    patient said it may have been diverticulitis on cipro for a long period  . Dyslipidemia   . Hemorrhoids   . Hyperlipidemia   . Hypertension   . Internal hemorrhoid   . Macular degeneration   . Osteoporosis    neck and hip osteopenia   Past Surgical History:  Procedure Laterality Date  . ABDOMINAL HYSTERECTOMY    . CATARACT EXTRACTION     left eye 05-13-2013  . CHOLECYSTECTOMY    . COLON SURGERY  2008  . COLONOSCOPY    . laparotomy and removal of polyp    . sigmoid colectomy     Family History  Problem Relation Age of Onset  . Fibromyalgia Mother   . Hypertension Mother   . Other Mother        "hallucinations" and fibromyalgia  . Ovarian cysts  Mother        "growths"  . Heart disease Father   . Diabetes Father   . Heart attack Father 40       d. due to 2nd heart attack at 17  . Breast cancer Maternal Aunt        dx 49s; s/p mastectomy  . Other Maternal Aunt        hx of colon surgery in her 12s, lim info  . COPD Brother   . Heart attack Brother   . Kidney disease Brother        smoker; d. 61  . Bipolar disorder Brother   . Skin cancer Brother        described as a "black mole"  . Diabetes Brother   . Endometriosis Daughter   . Other Daughter        hx of breast biopsies  . Diabetes Paternal Aunt   . AAA (abdominal aortic aneurysm) Paternal Aunt   . Stroke Maternal Grandfather   . Other Maternal Grandfather        had an operation on his intestines to address constipation  . Stroke Paternal Aunt   . Stroke Paternal Aunt   .  Diabetes Paternal Uncle   . Cancer Cousin        d. NOS cancer (maybe liver or lung cancer); +EtOH  . Diabetes Paternal Grandmother   . Other Paternal Grandmother        intestinal issues  . Stroke Paternal Grandfather   . Bipolar disorder Other   . Colon cancer Neg Hx   . Rectal cancer Neg Hx   . Stomach cancer Neg Hx   . Esophageal cancer Neg Hx   . Colon polyps Neg Hx    History  Sexual Activity  . Sexual activity: Not Currently    Outpatient Encounter Prescriptions as of 11/21/2016  Medication Sig  . acetaminophen (TYLENOL) 500 MG tablet Take 500 mg by mouth every 6 (six) hours as needed.  Marland Kitchen aspirin (BAYER LOW DOSE) 81 MG EC tablet   . atenolol (TENORMIN) 50 MG tablet TAKE 1 TABLET BY MOUTH  DAILY  . Cholecalciferol 2000 units TABS Take 1 tablet (2,000 Units total) by mouth daily.  . clorazepate (TRANXENE) 7.5 MG tablet Take 1 tablet (7.5 mg total) by mouth 4 (four) times daily as needed for anxiety.  . fosinopril-hydrochlorothiazide (MONOPRIL-HCT) 20-12.5 MG tablet TAKE 1 TABLET BY MOUTH  DAILY  . Krill Oil 300 MG CAPS Take 600 mg by mouth.  . Multiple Vitamins-Minerals (ICAPS  AREDS 2 PO) Take 1 capsule by mouth 2 (two) times daily.  . simvastatin (ZOCOR) 10 MG tablet TAKE 1 TABLET BY MOUTH AT  BEDTIME   Facility-Administered Encounter Medications as of 11/21/2016  Medication  . 0.9 %  sodium chloride infusion    Activities of Daily Living In your present state of health, do you have any difficulty performing the following activities: 11/21/2016  Hearing? N  Vision? Y  Difficulty concentrating or making decisions? N  Walking or climbing stairs? N  Dressing or bathing? N  Doing errands, shopping? N  Preparing Food and eating ? N  Using the Toilet? N  In the past six months, have you accidently leaked urine? Y  Do you have problems with loss of bowel control? Y  Managing your Medications? N  Managing your Finances? N  Housekeeping or managing your Housekeeping? N  Some recent data might be hidden    Patient Care Team: [name], MD as PCP - General (Internal Medicine) Fay Records, MD as Consulting Physician (Cardiology) Druscilla Brownie, MD as Consulting Physician (Dermatology)    Assessment:    Physical assessment deferred to PCP.  Exercise Activities and Dietary recommendations Current Exercise Habits: The patient does not participate in regular exercise at present (chair exercise pamphlet provided), Exercise limited by: None identified  Diet (meal preparation, eat out, water intake, caffeinated beverages, dairy products, fruits and vegetables): in general, a "healthy" diet  , well balanced  Reviewed heart healthy, Diet education was attached to patient's AVS. Discussed weight loss tips, relevant patient education assigned to patient using Emmi.  Goals    . Exercise 150 minutes per week (moderate activity)          Will start stationary bike riding and build up to 30 min 3 to 4 times per day  Fat free or low fat dairy products Fish high in omega-3 acids ( salmon, tuna, trout) Fruits, such as apples, bananas, oranges, pears,  prunes Legumes, such as kidney beans, lentils, checkpeas, black-eyed peas and lima beans Vegetables; broccoli, cabbage, carrots Whole grains;   Plant fats are better; decrease "white" foods as pasta, rice, bread and desserts,  sugar; Avoid red meat (limiting) palm and coconut oils; sugary foods and beverages  Two nutrients that raise blood chol levels are saturated fats and trans fat; in hydrogenated oils and fats, as stick margarine, baked goods (cookes, cakes, pies, crackers; frosting; and coffee creamers;   Some Fats lower cholesterol: Monounsaturated and polyunsaturated  Avocados Corn, sunflower, and soybean oils Nuts and seeds, such as walnuts Olive, canola, peanut, safflower, and sesame oils Peanut butter Salmon and trout Tofu       . Exercise 150 minutes per week (moderate activity)          Pedometer from [facility] Will track walking and start with 20 minutes x 5 days a week ( can do 10 minutes at a time)     . Reduce fat intake to X grams per day          Keep trying to improve her diet;  Lowered fat overall        Fall Risk Fall Risk  11/21/2016 10/31/2015 12/28/2014 12/22/2014 02/19/2014  Falls in the past year? No No No No No   Depression Screen PHQ 2/9 Scores 11/21/2016 10/31/2015 12/28/2014 12/22/2014  PHQ - 2 Score 1 0 0 2  PHQ- 9 Score 2 - 0 5     Cognitive Function MMSE - Mini Mental State Exam 10/31/2015  Not completed: (No Data)        Immunization History  Administered Date(s) Administered  . Influenza, High Dose Seasonal PF 12/27/2015, 11/21/2016  . Influenza,inj,Quad PF,6+ Mos 02/09/2013, 11/17/2013, 12/22/2014  . Pneumococcal Conjugate-13 12/28/2014  . Pneumococcal Polysaccharide-23 12/27/2015  . Tetanus 02/09/2013   Screening Tests Health Maintenance  Topic Date Due  . INFLUENZA VACCINE  10/17/2016  . COLON CANCER SCREENING ANNUAL FOBT  11/26/2016  . MAMMOGRAM  11/24/2017  . COLONOSCOPY  11/15/2021  . TETANUS/TDAP  02/10/2023  . DEXA SCAN  Completed   . Hepatitis C Screening  Completed  . PNA vac Low Risk Adult  Completed      Plan:  Continue doing brain stimulating activities (puzzles, reading, adult coloring books, staying active) to keep memory sharp.   Continue to eat heart healthy diet (full of fruits, vegetables, whole grains, lean protein, water--limit salt, fat, and sugar intake) and increase physical activity as tolerated.  I have personally reviewed and noted the following in the patient's chart:   . Medical and social history . Use of alcohol, tobacco or illicit drugs  . Current medications and supplements . Functional ability and status . Nutritional status . Physical activity . Advanced directives . List of other physicians . Vitals . Screenings to include cognitive, depression, and falls . Referrals and appointments  In addition, I have reviewed and discussed with patient certain preventive protocols, quality metrics, and best practice recommendations. A written personalized care plan for preventive services as well as general preventive health recommendations were provided to patient.     Michiel Cowboy, RN  11/21/2016   Medical screening examination/treatment/procedure(s) were performed by non-physician practitioner and as supervising physician I was immediately available for consultation/collaboration. I agree with above. Scarlette Calico, MD

## 2016-11-20 NOTE — Progress Notes (Signed)
Pre visit review using our clinic review tool, if applicable. No additional management support is needed unless otherwise documented below in the visit note. 

## 2016-11-21 ENCOUNTER — Ambulatory Visit (INDEPENDENT_AMBULATORY_CARE_PROVIDER_SITE_OTHER): Payer: Medicare Other | Admitting: *Deleted

## 2016-11-21 VITALS — BP 126/78 | HR 54 | Resp 20 | Ht 64.0 in | Wt 230.0 lb

## 2016-11-21 DIAGNOSIS — Z23 Encounter for immunization: Secondary | ICD-10-CM

## 2016-11-21 DIAGNOSIS — Z0000 Encounter for general adult medical examination without abnormal findings: Secondary | ICD-10-CM

## 2016-11-21 NOTE — Patient Instructions (Addendum)
Continue doing brain stimulating activities (puzzles, reading, adult coloring books, staying active) to keep memory sharp.   Continue to eat heart healthy diet (full of fruits, vegetables, whole grains, lean protein, water--limit salt, fat, and sugar intake) and increase physical activity as tolerated.   Stephanie Pham , Thank you for taking time to come for your Medicare Wellness Visit. I appreciate your ongoing commitment to your health goals. Please review the following plan we discussed and let me know if I can assist you in the future.   These are the goals we discussed: Goals    . Exercise 150 minutes per week (moderate activity)          Will start stationary bike riding and build up to 30 min 3 to 4 times per day  Fat free or low fat dairy products Fish high in omega-3 acids ( salmon, tuna, trout) Fruits, such as apples, bananas, oranges, pears, prunes Legumes, such as kidney beans, lentils, checkpeas, black-eyed peas and lima beans Vegetables; broccoli, cabbage, carrots Whole grains;   Plant fats are better; decrease "white" foods as pasta, rice, bread and desserts, sugar; Avoid red meat (limiting) palm and coconut oils; sugary foods and beverages  Two nutrients that raise blood chol levels are saturated fats and trans fat; in hydrogenated oils and fats, as stick margarine, baked goods (cookes, cakes, pies, crackers; frosting; and coffee creamers;   Some Fats lower cholesterol: Monounsaturated and polyunsaturated  Avocados Corn, sunflower, and soybean oils Nuts and seeds, such as walnuts Olive, canola, peanut, safflower, and sesame oils Peanut butter Salmon and trout Tofu       . Exercise 150 minutes per week (moderate activity)          Pedometer from [facility] Will track walking and start with 20 minutes x 5 days a week ( can do 10 minutes at a time)     . Reduce fat intake to X grams per day          Keep trying to improve her diet;  Lowered fat overall         This is  a list of the screening recommended for you and due dates:  Health Maintenance  Topic Date Due  . Flu Shot  10/17/2016  . Stool Blood Test  11/26/2016  . Mammogram  11/24/2017  . Colon Cancer Screening  11/15/2021  . Tetanus Vaccine  02/10/2023  . DEXA scan (bone density measurement)  Completed  .  Hepatitis C: One time screening is recommended by Center for Disease Control  (CDC) for  adults born from [birth year] through 1966.   Completed  . Pneumonia vaccines  Completed   Influenza Virus Vaccine (Flucelvax) What is this medicine? INFLUENZA VIRUS VACCINE (in floo EN zuh VAHY ruhs vak SEEN) helps to reduce the risk of getting influenza also known as the flu. The vaccine only helps protect you against some strains of the flu. This medicine may be used for other purposes; ask your health care provider or pharmacist if you have questions. COMMON BRAND NAME(S): FLUCELVAX What should I tell my health care provider before I take this medicine? They need to know if you have any of these conditions: -bleeding disorder like hemophilia -fever or infection -Guillain-Barre syndrome or other neurological problems -immune system problems -infection with the human immunodeficiency virus (HIV) or AIDS -low blood platelet counts -multiple sclerosis -an unusual or allergic reaction to influenza virus vaccine, other medicines, foods, dyes or preservatives -pregnant or trying to get pregnant -  breast-feeding How should I use this medicine? This vaccine is for injection into a muscle. It is given by a health care professional. A copy of Vaccine Information Statements will be given before each vaccination. Read this sheet carefully each time. The sheet may change frequently. Talk to your pediatrician regarding the use of this medicine in children. Special care may be needed. Overdosage: If you think you've taken too much of this medicine contact a poison control center or emergency room at once. Overdosage: If  you think you have taken too much of this medicine contact a poison control center or emergency room at once. NOTE: This medicine is only for you. Do not share this medicine with others. What if I miss a dose? This does not apply. What may interact with this medicine? -chemotherapy or radiation therapy -medicines that lower your immune system like etanercept, anakinra, infliximab, and adalimumab -medicines that treat or prevent blood clots like warfarin -phenytoin -steroid medicines like prednisone or cortisone -theophylline -vaccines This list may not describe all possible interactions. Give your health care provider a list of all the medicines, herbs, non-prescription drugs, or dietary supplements you use. Also tell them if you smoke, drink alcohol, or use illegal drugs. Some items may interact with your medicine. What should I watch for while using this medicine? Report any side effects that do not go away within 3 days to your doctor or health care professional. Call your health care provider if any unusual symptoms occur within 6 weeks of receiving this vaccine. You may still catch the flu, but the illness is not usually as bad. You cannot get the flu from the vaccine. The vaccine will not protect against colds or other illnesses that may cause fever. The vaccine is needed every year. What side effects may I notice from receiving this medicine? Side effects that you should report to your doctor or health care professional as soon as possible: -allergic reactions like skin rash, itching or hives, swelling of the face, lips, or tongue Side effects that usually do not require medical attention (Report these to your doctor or health care professional if they continue or are bothersome.): -fever -headache -muscle aches and pains -pain, tenderness, redness, or swelling at the injection site -tiredness This list may not describe all possible side effects. Call your doctor for medical advice  about side effects. You may report side effects to FDA at 1-800-FDA-1088. Where should I keep my medicine? The vaccine will be given by a health care professional in a clinic, pharmacy, doctor's office, or other health care setting. You will not be given vaccine doses to store at home. NOTE: This sheet is a summary. It may not cover all possible information. If you have questions about this medicine, talk to your doctor, pharmacist, or health care provider.  2018 Elsevier/Gold Standard (2011-02-14 14:06:47)  Heart-Healthy Eating Plan Heart-healthy meal planning includes:  Limiting unhealthy fats.  Increasing healthy fats.  Making other small dietary changes.  You may need to talk with your doctor or a diet specialist (dietitian) to create an eating plan that is right for you. What types of fat should I choose?  Choose healthy fats. These include olive oil and canola oil, flaxseeds, walnuts, almonds, and seeds.  Eat more omega-3 fats. These include salmon, mackerel, sardines, tuna, flaxseed oil, and ground flaxseeds. Try to eat fish at least twice each week.  Limit saturated fats. ? Saturated fats are often found in animal products, such as meats, butter, and cream. ?  Plant sources of saturated fats include palm oil, palm kernel oil, and coconut oil.  Avoid foods with partially hydrogenated oils in them. These include stick margarine, some tub margarines, cookies, crackers, and other baked goods. These contain trans fats. What general guidelines do I need to follow?  Check food labels carefully. Identify foods with trans fats or high amounts of saturated fat.  Fill one half of your plate with vegetables and green salads. Eat 4-5 servings of vegetables per day. A serving of vegetables is: ? 1 cup of raw leafy vegetables. ?  cup of raw or cooked cut-up vegetables. ?  cup of vegetable juice.  Fill one fourth of your plate with whole grains. Look for the word "whole" as the first  word in the ingredient list.  Fill one fourth of your plate with lean protein foods.  Eat 4-5 servings of fruit per day. A serving of fruit is: ? One medium whole fruit. ?  cup of dried fruit. ?  cup of fresh, frozen, or canned fruit. ?  cup of 100% fruit juice.  Eat more foods that contain soluble fiber. These include apples, broccoli, carrots, beans, peas, and barley. Try to get 20-30 g of fiber per day.  Eat more home-cooked food. Eat less restaurant, buffet, and fast food.  Limit or avoid alcohol.  Limit foods high in starch and sugar.  Avoid fried foods.  Avoid frying your food. Try baking, boiling, grilling, or broiling it instead. You can also reduce fat by: ? Removing the skin from poultry. ? Removing all visible fats from meats. ? Skimming the fat off of stews, soups, and gravies before serving them. ? Steaming vegetables in water or broth.  Lose weight if you are overweight.  Eat 4-5 servings of nuts, legumes, and seeds per week: ? One serving of dried beans or legumes equals  cup after being cooked. ? One serving of nuts equals 1 ounces. ? One serving of seeds equals  ounce or one tablespoon.  You may need to keep track of how much salt or sodium you eat. This is especially true if you have high blood pressure. Talk with your doctor or dietitian to get more information. What foods can I eat? Grains Breads, including [place], white, pita, wheat, raisin, rye, oatmeal, and [place]. Tortillas that are neither fried nor made with lard or trans fat. Low-fat rolls, including hotdog and hamburger buns and English muffins. Biscuits. Muffins. Waffles. Pancakes. Light popcorn. Whole-grain cereals. Flatbread. Melba toast. Pretzels. Breadsticks. Rusks. Low-fat snacks. Low-fat crackers, including oyster, saltine, matzo, graham, animal, and rye. Rice and pasta, including brown rice and pastas that are made with whole wheat. Vegetables All vegetables. Fruits All fruits, but  limit coconut. Meats and Other Protein Sources Lean, well-trimmed beef, veal, pork, and lamb. Chicken and [place] without skin. All fish and shellfish. Wild duck, rabbit, pheasant, and venison. Egg whites or low-cholesterol egg substitutes. Dried beans, peas, lentils, and tofu. Seeds and most nuts. Dairy Low-fat or nonfat cheeses, including ricotta, string, and mozzarella. Skim or 1% milk that is liquid, powdered, or evaporated. Buttermilk that is made with low-fat milk. Nonfat or low-fat yogurt. Beverages Mineral water. Diet carbonated beverages. Sweets and Desserts Sherbets and fruit ices. Honey, jam, marmalade, jelly, and syrups. Meringues and gelatins. Pure sugar candy, such as hard candy, jelly beans, gumdrops, mints, marshmallows, and small amounts of dark chocolate. [employer] Eat all sweets and desserts in moderation. Fats and Oils Nonhydrogenated (trans-free) margarines. Vegetable oils, including  soybean, sesame, sunflower, olive, peanut, safflower, corn, canola, and cottonseed. Salad dressings or mayonnaise made with a vegetable oil. Limit added fats and oils that you use for cooking, baking, salads, and as spreads. Other Cocoa powder. Coffee and tea. All seasonings and condiments. The items listed above may not be a complete list of recommended foods or beverages. Contact your dietitian for more options. What foods are not recommended? Grains Breads that are made with saturated or trans fats, oils, or whole milk. Croissants. Butter rolls. Cheese breads. Sweet rolls. Donuts. Buttered popcorn. Chow mein noodles. High-fat crackers, such as cheese or butter crackers. Meats and Other Protein Sources Fatty meats, such as hotdogs, short ribs, sausage, spareribs, bacon, rib eye roast or steak, and mutton. High-fat deli meats, such as salami and bologna. Caviar. Domestic duck and goose. Organ meats, such as kidney, liver, sweetbreads, and heart. Dairy Cream, sour cream, cream cheese, and  creamed cottage cheese. Whole-milk cheeses, including blue (bleu), Monte[place] Jack, [place], [place], American, [place], Swiss, cheddar, [place], and [place]. Whole or 2% milk that is liquid, evaporated, or condensed. Whole buttermilk. Cream sauce or high-fat cheese sauce. Yogurt that is made from whole milk. Beverages Regular sodas and juice drinks with added sugar. Sweets and Desserts Frosting. Pudding. Cookies. Cakes other than angel food cake. Candy that has milk chocolate or white chocolate, hydrogenated fat, butter, coconut, or unknown ingredients. Buttered syrups. Full-fat ice cream or ice cream drinks. Fats and Oils Gravy that has suet, meat fat, or shortening. Cocoa butter, hydrogenated oils, palm oil, coconut oil, palm kernel oil. These can often be found in baked products, candy, fried foods, nondairy creamers, and whipped toppings. Solid fats and shortenings, including bacon fat, salt pork, lard, and butter. Nondairy cream substitutes, such as coffee creamers and sour cream substitutes. Salad dressings that are made of unknown oils, cheese, or sour cream. The items listed above may not be a complete list of foods and beverages to avoid. Contact your dietitian for more information. This information is not intended to replace advice given to you by your health care provider. Make sure you discuss any questions you have with your health care provider. Document Released: 09/04/2011 Document Revised: 08/11/2015 Document Reviewed: 08/27/2013 Elsevier Interactive Patient Education  2018 [employer]. Diabetes Mellitus and Food It is important for you to manage your blood sugar (glucose) level. Your blood glucose level can be greatly affected by what you eat. Eating healthier foods in the appropriate amounts throughout the day at about the same time each day will help you control your blood glucose level. It can also help slow or prevent worsening of your diabetes mellitus. Healthy eating may even help  you improve the level of your blood pressure and reach or maintain a healthy weight. General recommendations for healthful eating and cooking habits include:  Eating meals and snacks regularly. Avoid going long periods of time without eating to lose weight.  Eating a diet that consists mainly of plant-based foods, such as fruits, vegetables, nuts, legumes, and whole grains.  Using low-heat cooking methods, such as baking, instead of high-heat cooking methods, such as deep frying.  Work with your dietitian to make sure you understand how to use the Nutrition Facts information on food labels. How can food affect me? Carbohydrates Carbohydrates affect your blood glucose level more than any other type of food. Your dietitian will help you determine how many carbohydrates to eat at each meal and teach you how to count carbohydrates. Counting carbohydrates is important to  keep your blood glucose at a healthy level, especially if you are using insulin or taking certain medicines for diabetes mellitus. Alcohol Alcohol can cause sudden decreases in blood glucose (hypoglycemia), especially if you use insulin or take certain medicines for diabetes mellitus. Hypoglycemia can be a life-threatening condition. Symptoms of hypoglycemia (sleepiness, dizziness, and disorientation) are similar to symptoms of having too much alcohol. If your health care provider has given you approval to drink alcohol, do so in moderation and use the following guidelines:  Women should not have more than one drink per day, and men should not have more than two drinks per day. One drink is equal to: ? 12 oz of beer. ? 5 oz of Marlise Fahr. ? 1 oz of hard liquor.  Do not drink on an empty stomach.  Keep yourself hydrated. Have water, diet soda, or unsweetened iced tea.  Regular soda, juice, and other mixers might contain a lot of carbohydrates and should be counted.  What foods are not recommended? As you make food choices, it is  important to remember that all foods are not the same. Some foods have fewer nutrients per serving than other foods, even though they might have the same number of calories or carbohydrates. It is difficult to get your body what it needs when you eat foods with fewer nutrients. Examples of foods that you should avoid that are high in calories and carbohydrates but low in nutrients include:  Trans fats (most processed foods list trans fats on the Nutrition Facts label).  Regular soda.  Juice.  Candy.  Sweets, such as cake, pie, doughnuts, and cookies.  Fried foods.  What foods can I eat? Eat nutrient-rich foods, which will nourish your body and keep you healthy. The food you should eat also will depend on several factors, including:  The calories you need.  The medicines you take.  Your weight.  Your blood glucose level.  Your blood pressure level.  Your cholesterol level.  You should eat a variety of foods, including:  Protein. ? Lean cuts of meat. ? Proteins low in saturated fats, such as fish, egg whites, and beans. Avoid processed meats.  Fruits and vegetables. ? Fruits and vegetables that may help control blood glucose levels, such as apples, mangoes, and yams.  Dairy products. ? Choose fat-free or low-fat dairy products, such as milk, yogurt, and cheese.  Grains, bread, pasta, and rice. ? Choose whole grain products, such as multigrain bread, whole oats, and brown rice. These foods may help control blood pressure.  Fats. ? Foods containing healthful fats, such as nuts, avocado, olive oil, canola oil, and fish.  Does everyone with diabetes mellitus have the same meal plan? Because every person with diabetes mellitus is different, there is not one meal plan that works for everyone. It is very important that you meet with a dietitian who will help you create a meal plan that is just right for you. This information is not intended to replace advice given to you by  your health care provider. Make sure you discuss any questions you have with your health care provider. Document Released: 11/30/2004 Document Revised: 08/11/2015 Document Reviewed: 01/30/2013 Elsevier Interactive Patient Education  2017 [employer].

## 2016-11-28 DIAGNOSIS — Z1231 Encounter for screening mammogram for malignant neoplasm of breast: Secondary | ICD-10-CM | POA: Diagnosis not present

## 2016-11-28 LAB — HM MAMMOGRAPHY

## 2016-12-05 ENCOUNTER — Encounter: Payer: Self-pay | Admitting: Internal Medicine

## 2017-02-15 ENCOUNTER — Ambulatory Visit: Payer: Medicare Other | Admitting: Internal Medicine

## 2017-02-15 ENCOUNTER — Encounter: Payer: Self-pay | Admitting: Internal Medicine

## 2017-02-15 VITALS — BP 132/68 | HR 68 | Ht 64.0 in | Wt 235.6 lb

## 2017-02-15 DIAGNOSIS — I10 Essential (primary) hypertension: Secondary | ICD-10-CM | POA: Diagnosis not present

## 2017-02-15 DIAGNOSIS — R0600 Dyspnea, unspecified: Secondary | ICD-10-CM

## 2017-02-15 DIAGNOSIS — E785 Hyperlipidemia, unspecified: Secondary | ICD-10-CM

## 2017-02-15 MED ORDER — SIMVASTATIN 20 MG PO TABS: 20.0000 mg | ORAL_TABLET | Freq: Every day | ORAL | 3 refills | Status: DC

## 2017-02-15 NOTE — Patient Instructions (Signed)
Your physician has recommended you make the following change in your medication:  1.) increase simvastatin to 20 mg -one tablet once a day  Your physician recommends that you return for lab work in: 2 months (LIPIDS).  Your physician wants you to follow-up in: October, 2019 with Dr. Harrington Challenger.  You will receive a reminder letter in the mail two months in advance. If you don't receive a letter, please call our office to schedule the follow-up appointment.

## 2017-02-15 NOTE — Progress Notes (Signed)
Cardiology Office Note   Date:  02/15/2017   ID:  Stephanie Pham, DOB 08/09/1950, MRN [id number]  PCP:  [name], MD  Cardiologist:   Dorris Carnes, MD    Pt presents for f/u of HTN and SOB   History of Present Illness: Stephanie Pham is a 66 y.o. female with a history of HTN and dyspea I saw her in Nov 2017 Since seen she denies CP  Does get SOB with walking stairs  Chronic  No real worsening    Outpatient Medications Prior to Visit  Medication Sig Dispense Refill  . acetaminophen (TYLENOL) 500 MG tablet Take 500 mg by mouth every 6 (six) hours as needed.    Marland Kitchen aspirin (BAYER LOW DOSE) 81 MG EC tablet     . atenolol (TENORMIN) 50 MG tablet TAKE 1 TABLET BY MOUTH  DAILY 90 tablet 1  . Cholecalciferol 2000 units TABS Take 1 tablet (2,000 Units total) by mouth daily. 90 tablet 3  . clorazepate (TRANXENE) 7.5 MG tablet Take 1 tablet (7.5 mg total) by mouth 4 (four) times daily as needed for anxiety. 360 tablet 1  . fosinopril-hydrochlorothiazide (MONOPRIL-HCT) 20-12.5 MG tablet TAKE 1 TABLET BY MOUTH  DAILY 90 tablet 1  . Krill Oil 300 MG CAPS Take 600 mg by mouth.    . Multiple Vitamins-Minerals (ICAPS AREDS 2 PO) Take 1 capsule by mouth 2 (two) times daily.    . simvastatin (ZOCOR) 10 MG tablet TAKE 1 TABLET BY MOUTH AT  BEDTIME 90 tablet 1   Facility-Administered Medications Prior to Visit  Medication Dose Route Frequency Provider Last Rate Last Dose  . 0.9 %  sodium chloride infusion  500 mL Intravenous Continuous Gatha Mayer, MD         Allergies:   Codeine; Iohexol; Ivp dye [iodinated diagnostic agents]; Minocycline; and Sulfonamide derivatives   Past Medical History:  Diagnosis Date  . Allergy   . Anemia   . Blood transfusion without reported diagnosis 1976  . Cancer ([place])    colon cancer 1976  . Cataract    both eyes-surgery  . Chronic anxiety   . Chronic depression   . Diverticulosis of colon    patient said it may have been diverticulitis on  cipro for a long period  . Dyslipidemia   . Hemorrhoids   . Hyperlipidemia   . Hypertension   . Internal hemorrhoid   . Macular degeneration   . Osteoporosis    neck and hip osteopenia    Past Surgical History:  Procedure Laterality Date  . ABDOMINAL HYSTERECTOMY    . CATARACT EXTRACTION     left eye 05-13-2013  . CHOLECYSTECTOMY    . COLON SURGERY  2008  . COLONOSCOPY    . laparotomy and removal of polyp    . sigmoid colectomy       Social History:  The patient  reports that  has never smoked. she has never used smokeless tobacco. She reports that she does not drink alcohol or use drugs.   Family History:  The patient's family history includes AAA (abdominal aortic aneurysm) in her paternal aunt; Bipolar disorder in her brother and other; Breast cancer in her maternal aunt; COPD in her brother; Cancer in her cousin; Diabetes in her brother, father, paternal aunt, paternal grandmother, and paternal uncle; Endometriosis in her daughter; Fibromyalgia in her mother; Heart attack in her brother; Heart attack (age of onset: 56) in her father; Heart disease in her father;  Hypertension in her mother; Kidney disease in her brother; Other in her daughter, maternal aunt, maternal grandfather, mother, and paternal grandmother; Ovarian cysts in her mother; Skin cancer in her brother; Stroke in her maternal grandfather, paternal aunt, paternal aunt, and paternal grandfather.    ROS:  Please see the history of present illness. All other systems are reviewed and  Negative to the above problem except as noted.    PHYSICAL EXAM: VS:  BP 132/68   Pulse 68   Ht 5\' 4"  (1.626 m)   Wt 235 lb 9.6 oz (106.9 kg)   BMI 40.44 kg/m   GEN: Morbidly obese 66  , in no acute distress  HEENT: normal  Neck: Neck is full  , carotid bruits, or masses Cardiac: RRR; no murmurs, rubs, or gallops,Tr e  dema  Respiratory:  clear to auscultation bilaterally, normal work of breathing GI: soft, nontender,  nondistended, + BS  No hepatomegaly  MS: no deformity Moving all extremities   Skin: warm   Very dry   Neuro:  Strength and sensation are intact Psych: euthymic mood, full affect   EKG:  EKG is not  ordered today.  On 10.10   SR 73 bpm     Lipid Panel    Component Value Date/Time   CHOL 177 10/09/2016 0726   TRIG 282.0 (H) 10/09/2016 0726   HDL 46.60 10/09/2016 0726   CHOLHDL 4 10/09/2016 0726   VLDL 56.4 (H) 10/09/2016 0726   LDLDIRECT 82.0 10/09/2016 0726      Wt Readings from Last 3 Encounters:  02/15/17 235 lb 9.6 oz (106.9 kg)  11/21/16 230 lb (104.3 kg)  11/13/16 229 lb (103.9 kg)      ASSESSMENT AND PLAN:  1  Dyspnea  Denies signficant SOB  Only  With stairs  Will continue to follow     2  HL  Will increase intensity of statin to 20 Zocor  F.U in 2 months    2  HTN  BP is OK  Continue and follow  4  Morbid obesity  Counselled on diet  Needs to lose wt  Watch salt  F/U based on test results      Current medicines are reviewed at length with the patient today.  The patient does not have concerns regarding medicines.  Signed, Dorris Carnes, MD  02/15/2017 9:58 AM    [address], [place], [place]  [id number] Phone: [phone]; Fax: [phone]

## 2017-02-20 ENCOUNTER — Other Ambulatory Visit: Payer: Self-pay | Admitting: Internal Medicine

## 2017-03-22 ENCOUNTER — Other Ambulatory Visit: Payer: Self-pay | Admitting: Internal Medicine

## 2017-03-22 DIAGNOSIS — F411 Generalized anxiety disorder: Secondary | ICD-10-CM

## 2017-04-17 ENCOUNTER — Other Ambulatory Visit: Payer: Medicare Other

## 2017-06-17 ENCOUNTER — Ambulatory Visit: Payer: Medicare Other | Admitting: Internal Medicine

## 2017-06-17 ENCOUNTER — Encounter: Payer: Self-pay | Admitting: Internal Medicine

## 2017-06-17 ENCOUNTER — Other Ambulatory Visit (INDEPENDENT_AMBULATORY_CARE_PROVIDER_SITE_OTHER): Payer: Medicare Other

## 2017-06-17 VITALS — BP 120/74 | HR 61 | Temp 98.7°F | Ht 64.0 in | Wt 237.1 lb

## 2017-06-17 DIAGNOSIS — E559 Vitamin D deficiency, unspecified: Secondary | ICD-10-CM

## 2017-06-17 DIAGNOSIS — E781 Pure hyperglyceridemia: Secondary | ICD-10-CM

## 2017-06-17 DIAGNOSIS — R739 Hyperglycemia, unspecified: Secondary | ICD-10-CM

## 2017-06-17 DIAGNOSIS — E785 Hyperlipidemia, unspecified: Secondary | ICD-10-CM

## 2017-06-17 DIAGNOSIS — I10 Essential (primary) hypertension: Secondary | ICD-10-CM

## 2017-06-17 DIAGNOSIS — K760 Fatty (change of) liver, not elsewhere classified: Secondary | ICD-10-CM

## 2017-06-17 DIAGNOSIS — F411 Generalized anxiety disorder: Secondary | ICD-10-CM

## 2017-06-17 LAB — CBC WITH DIFFERENTIAL/PLATELET
Basophils Absolute: 0.1 10*3/uL (ref 0.0–0.1)
Basophils Relative: 1 % (ref 0.0–3.0)
EOS PCT: 1.9 % (ref 0.0–5.0)
Eosinophils Absolute: 0.1 10*3/uL (ref 0.0–0.7)
HCT: 39.9 % (ref 36.0–46.0)
HEMOGLOBIN: 13.4 g/dL (ref 12.0–15.0)
LYMPHS ABS: 1.4 10*3/uL (ref 0.7–4.0)
Lymphocytes Relative: 25.2 % (ref 12.0–46.0)
MCHC: 33.7 g/dL (ref 30.0–36.0)
MCV: 93.4 fl (ref 78.0–100.0)
MONOS PCT: 8.1 % (ref 3.0–12.0)
Monocytes Absolute: 0.5 10*3/uL (ref 0.1–1.0)
Neutro Abs: 3.6 10*3/uL (ref 1.4–7.7)
Neutrophils Relative %: 63.8 % (ref 43.0–77.0)
Platelets: 258 10*3/uL (ref 150.0–400.0)
RBC: 4.27 Mil/uL (ref 3.87–5.11)
RDW: 13.3 % (ref 11.5–15.5)
WBC: 5.6 10*3/uL (ref 4.0–10.5)

## 2017-06-17 LAB — COMPREHENSIVE METABOLIC PANEL
ALK PHOS: 77 U/L (ref 39–117)
ALT: 19 U/L (ref 0–35)
AST: 18 U/L (ref 0–37)
Albumin: 4.4 g/dL (ref 3.5–5.2)
BUN: 11 mg/dL (ref 6–23)
CO2: 27 mEq/L (ref 19–32)
Calcium: 9.6 mg/dL (ref 8.4–10.5)
Chloride: 101 mEq/L (ref 96–112)
Creatinine, Ser: 0.91 mg/dL (ref 0.40–1.20)
GFR: 65.44 mL/min (ref >60.00)
Glucose, Bld: 116 mg/dL — ABNORMAL HIGH (ref 70–99)
POTASSIUM: 3.6 meq/L (ref 3.5–5.1)
Sodium: 140 mEq/L (ref 135–145)
TOTAL PROTEIN: 8.2 g/dL (ref 6.0–8.3)
Total Bilirubin: 0.4 mg/dL (ref 0.2–1.2)

## 2017-06-17 LAB — LIPID PANEL
CHOLESTEROL: 160 mg/dL (ref 0–200)
HDL: 51.6 mg/dL (ref >39.00)
NonHDL: 108.65
Total CHOL/HDL Ratio: 3
Triglycerides: 257 mg/dL — ABNORMAL HIGH (ref 0.0–149.0)
VLDL: 51.4 mg/dL — ABNORMAL HIGH (ref 0.0–40.0)

## 2017-06-17 LAB — HEMOGLOBIN A1C: HEMOGLOBIN A1C: 5.6 % (ref 4.6–6.5)

## 2017-06-17 LAB — LDL CHOLESTEROL, DIRECT: Direct LDL: 79 mg/dL

## 2017-06-17 LAB — VITAMIN D 25 HYDROXY (VIT D DEFICIENCY, FRACTURES): VITD: 32.49 ng/mL (ref 30.00–100.00)

## 2017-06-17 MED ORDER — ATENOLOL 50 MG PO TABS: 50.0000 mg | ORAL_TABLET | Freq: Every day | ORAL | 1 refills | Status: DC

## 2017-06-17 MED ORDER — CLORAZEPATE DIPOTASSIUM 7.5 MG PO TABS: ORAL_TABLET | ORAL | 1 refills | Status: DC

## 2017-06-17 MED ORDER — FOSINOPRIL SODIUM-HCTZ 20-12.5 MG PO TABS: 1.0000 | ORAL_TABLET | Freq: Every day | ORAL | 1 refills | Status: DC

## 2017-06-17 NOTE — Progress Notes (Signed)
Subjective:  Patient ID: Stephanie Pham, female    DOB: 08/03/1950  Age: 67 y.o. MRN: [id number]  CC: Hypertension and Hyperlipidemia   HPI Stephanie Pham presents for f/up - She continues to struggle with anxiety but otherwise feels well and offers no other new complaints.  She struggles with chronic, mild lower extremity edema.  She denies any recent episodes of DOE, CP, palpitations, fatigue, or dizziness.  Outpatient Medications Prior to Visit  Medication Sig Dispense Refill  . acetaminophen (TYLENOL) 500 MG tablet Take 500 mg by mouth every 6 (six) hours as needed.    Marland Kitchen aspirin (BAYER LOW DOSE) 81 MG EC tablet     . Cholecalciferol 2000 units TABS Take 1 tablet (2,000 Units total) by mouth daily. 90 tablet 3  . Krill Oil 300 MG CAPS Take 900 mg by mouth.     . Multiple Vitamins-Minerals (ICAPS AREDS 2 PO) Take 1 capsule by mouth 2 (two) times daily.    . simvastatin (ZOCOR) 20 MG tablet Take 1 tablet (20 mg total) by mouth daily at 6 PM. 90 tablet 3  . atenolol (TENORMIN) 50 MG tablet TAKE 1 TABLET BY MOUTH  DAILY 90 tablet 1  . clorazepate (TRANXENE) 7.5 MG tablet TAKE 1 TABLET BY MOUTH 4  TIMES DAILY AS NEEDED FOR  ANXIETY 360 tablet 0  . fosinopril-hydrochlorothiazide (MONOPRIL-HCT) 20-12.5 MG tablet TAKE 1 TABLET BY MOUTH  DAILY 90 tablet 1  . fosinopril-hydrochlorothiazide (MONOPRIL-HCT) 20-12.5 MG tablet TAKE 1 TABLET BY MOUTH  DAILY 90 tablet 1  . 0.9 %  sodium chloride infusion      No facility-administered medications prior to visit.     ROS Review of Systems  Constitutional: Negative.  Negative for appetite change, diaphoresis and unexpected weight change.  HENT: Negative.   Eyes: Negative for visual disturbance.  Respiratory: Negative for cough, chest tightness, shortness of breath and wheezing.   Cardiovascular: Positive for leg swelling. Negative for chest pain and palpitations.  Gastrointestinal: Negative for abdominal pain, constipation, diarrhea, nausea and  vomiting.  Endocrine: Negative.   Genitourinary: Negative.   Musculoskeletal: Negative.  Negative for back pain, myalgias and neck pain.  Skin: Negative.  Negative for color change.  Neurological: Negative.  Negative for dizziness, weakness and light-headedness.  Hematological: Negative for adenopathy. Does not bruise/bleed easily.  Psychiatric/Behavioral: Negative for behavioral problems, decreased concentration, dysphoric mood, sleep disturbance and suicidal ideas. The patient is nervous/anxious.     Objective:  BP 120/74 (BP Location: Left Arm, Patient Position: Sitting, Cuff Size: Large)   Pulse 61   Temp 98.7 F (37.1 C) (Oral)   Ht 5\' 4"  (1.626 m)   Wt 237 lb 1.3 oz (107.5 kg)   SpO2 99%   BMI 40.69 kg/m   BP Readings from Last 3 Encounters:  06/17/17 120/74  02/15/17 132/68  11/21/16 126/78    Wt Readings from Last 3 Encounters:  06/17/17 237 lb 1.3 oz (107.5 kg)  02/15/17 235 lb 9.6 oz (106.9 kg)  11/21/16 230 lb (104.3 kg)    Physical Exam  Constitutional: She is oriented to person, place, and time. No distress.  HENT:  Mouth/Throat: Oropharynx is clear and moist. No oropharyngeal exudate.  Eyes: Conjunctivae are normal. Left eye exhibits no discharge. No scleral icterus.  Neck: Normal range of motion. Neck supple. No JVD present. No thyromegaly present.  Cardiovascular: Normal rate, regular rhythm and normal heart sounds. Exam reveals no gallop.  No murmur heard. Pulmonary/Chest: Effort normal and  breath sounds normal. No respiratory distress. She has no wheezes. She has no rales.  Abdominal: Soft. Bowel sounds are normal. She exhibits no mass. There is no tenderness. There is no guarding.  Musculoskeletal: Normal range of motion. She exhibits edema (trace pitting edema BLE). She exhibits no tenderness or deformity.  Lymphadenopathy:    She has no cervical adenopathy.  Neurological: She is alert and oriented to person, place, and time.  Skin: Skin is warm and  dry. No rash noted. She is not diaphoretic. No erythema. No pallor.  Psychiatric: She has a normal mood and affect. Her behavior is normal. Judgment and thought content normal.  Vitals reviewed.   Lab Results  Component Value Date   WBC 5.6 06/17/2017   HGB 13.4 06/17/2017   HCT 39.9 06/17/2017   PLT 258.0 06/17/2017   GLUCOSE 116 (H) 06/17/2017   CHOL 160 06/17/2017   TRIG 257.0 (H) 06/17/2017   HDL 51.60 06/17/2017   LDLDIRECT 79.0 06/17/2017   ALT 19 06/17/2017   AST 18 06/17/2017   NA 140 06/17/2017   K 3.6 06/17/2017   CL 101 06/17/2017   CREATININE 0.91 06/17/2017   BUN 11 06/17/2017   CO2 27 06/17/2017   TSH 2.10 10/09/2016   HGBA1C 5.6 06/17/2017    No results found.  Assessment & Plan:   [place] was seen today for hypertension and hyperlipidemia.  Diagnoses and all orders for this visit:  Hyperglycemia- Improvement noted -     Comprehensive metabolic panel; Future -     Hemoglobin A1c; Future  Hyperlipidemia with target LDL less than 130- She has achieved her LDL goal and is doing well on the statin. -     Lipid panel; Future  Hypertriglyceridemia- Her triglycerides are mildly elevated but do not warrant medical intervention.  She agrees to work on her lifestyle modifications. -     Lipid panel; Future  Vitamin D deficiency- Her vitamin D level is in the normal range.  Will continue the current vitamin D supplement. -     VITAMIN D 25 Hydroxy (Vit-D Deficiency, Fractures); Future  Essential hypertension- Her blood pressure is well controlled.  Electrolytes and renal function are normal.  Will continue the current regimen. -     Comprehensive metabolic panel; Future -     CBC with Differential/Platelet; Future -     atenolol (TENORMIN) 50 MG tablet; Take 1 tablet (50 mg total) by mouth daily. -     fosinopril-hydrochlorothiazide (MONOPRIL-HCT) 20-12.5 MG tablet; Take 1 tablet by mouth daily.  Fatty liver disease, nonalcoholic- Her LFTs are normal.  She  will continue to work on her lifestyle modifications.  GAD (generalized anxiety disorder) -     clorazepate (TRANXENE) 7.5 MG tablet; TAKE 1 TABLET BY MOUTH 4  TIMES DAILY AS NEEDED FOR  ANXIETY   I have discontinued Francis Gaines. Vaca's fosinopril-hydrochlorothiazide. I have also changed her atenolol and fosinopril-hydrochlorothiazide. Additionally, I am having her maintain her Cholecalciferol, Krill Oil, acetaminophen, aspirin, Multiple Vitamins-Minerals (ICAPS AREDS 2 PO), simvastatin, and clorazepate. We will stop administering sodium chloride.  Meds ordered this encounter  Medications  . atenolol (TENORMIN) 50 MG tablet    Sig: Take 1 tablet (50 mg total) by mouth daily.    Dispense:  90 tablet    Refill:  1  . clorazepate (TRANXENE) 7.5 MG tablet    Sig: TAKE 1 TABLET BY MOUTH 4  TIMES DAILY AS NEEDED FOR  ANXIETY    Dispense:  360  tablet    Refill:  1  . fosinopril-hydrochlorothiazide (MONOPRIL-HCT) 20-12.5 MG tablet    Sig: Take 1 tablet by mouth daily.    Dispense:  90 tablet    Refill:  1     Follow-up: Return in about 6 months (around 12/17/2017).  Scarlette Calico, MD

## 2017-06-17 NOTE — Patient Instructions (Signed)

## 2017-07-17 DIAGNOSIS — Z01419 Encounter for gynecological examination (general) (routine) without abnormal findings: Secondary | ICD-10-CM | POA: Diagnosis not present

## 2017-08-15 DIAGNOSIS — D1801 Hemangioma of skin and subcutaneous tissue: Secondary | ICD-10-CM | POA: Diagnosis not present

## 2017-08-15 DIAGNOSIS — I8311 Varicose veins of right lower extremity with inflammation: Secondary | ICD-10-CM | POA: Diagnosis not present

## 2017-08-15 DIAGNOSIS — L814 Other melanin hyperpigmentation: Secondary | ICD-10-CM | POA: Diagnosis not present

## 2017-08-15 DIAGNOSIS — L821 Other seborrheic keratosis: Secondary | ICD-10-CM | POA: Diagnosis not present

## 2017-08-15 DIAGNOSIS — D229 Melanocytic nevi, unspecified: Secondary | ICD-10-CM | POA: Diagnosis not present

## 2017-10-09 DIAGNOSIS — H02831 Dermatochalasis of right upper eyelid: Secondary | ICD-10-CM | POA: Diagnosis not present

## 2017-10-09 DIAGNOSIS — H5213 Myopia, bilateral: Secondary | ICD-10-CM | POA: Diagnosis not present

## 2017-10-09 DIAGNOSIS — H02834 Dermatochalasis of left upper eyelid: Secondary | ICD-10-CM | POA: Diagnosis not present

## 2017-10-09 DIAGNOSIS — H353132 Nonexudative age-related macular degeneration, bilateral, intermediate dry stage: Secondary | ICD-10-CM | POA: Diagnosis not present

## 2017-10-25 ENCOUNTER — Other Ambulatory Visit: Payer: Self-pay | Admitting: Internal Medicine

## 2017-10-25 DIAGNOSIS — I10 Essential (primary) hypertension: Secondary | ICD-10-CM

## 2017-10-25 NOTE — Telephone Encounter (Signed)
Pt needs a follow up in October. Pt should have enough meds to that appt but can send in more if needed closer to time.

## 2017-10-28 NOTE — Telephone Encounter (Signed)
Appt scheduled

## 2017-11-12 ENCOUNTER — Other Ambulatory Visit: Payer: Self-pay | Admitting: Internal Medicine

## 2017-11-12 DIAGNOSIS — I10 Essential (primary) hypertension: Secondary | ICD-10-CM

## 2017-11-21 NOTE — Progress Notes (Signed)
Subjective:   Stephanie Pham is a 67 y.o. female who presents for Medicare Annual (Subsequent) preventive examination.  Review of Systems:  No ROS.  Medicare Wellness Visit. Additional risk factors are reflected in the social history.  Cardiac Risk Factors include: advanced age (>75men, >47 women);dyslipidemia;hypertension;obesity (BMI >30kg/m2);sedentary lifestyle Sleep patterns: feels rested on waking, gets up 1-2 times nightly to void and sleeps 7-8 hours nightly.    Home Safety/Smoke Alarms: Feels safe in home. Smoke alarms in place.  Living environment; residence and Firearm Safety: 1-story house/ trailer, no firearms. Lives with husband, needs rollator walker for safe mobility, good support system Seat Belt Safety/Bike Helmet: Wears seat belt.      Objective:     Vitals: BP 136/82   Pulse 60   Temp 98.3 F (36.8 C)   Resp 18   Ht 5\' 4"  (1.626 m)   Wt 239 lb (108.4 kg)   SpO2 98%   BMI 41.02 kg/m   Body mass index is 41.02 kg/m.  Advanced Directives 11/22/2017 11/21/2016 11/13/2016 10/30/2016 10/31/2015 12/28/2014 12/22/2014  Does Patient Have a Medical Advance Directive? No No No No No Yes No  Type of Advance Directive - - - - - Press photographer;Living will -  Does patient want to make changes to medical advance directive? Yes (ED - Information included in AVS) - - - - No - Patient declined -  Copy of [place] in Chart? - - - - - Yes -  Would patient like information on creating a medical advance directive? - Yes (ED - Information included in AVS) - - Yes - Educational materials given - No - patient declined information    Tobacco Social History   Tobacco Use  Smoking Status Never Smoker  Smokeless Tobacco Never Used     Counseling given: Not Answered  Past Medical History:  Diagnosis Date  . Allergy   . Anemia   . Blood transfusion without reported diagnosis 1976  . Cancer ([place])    colon cancer 1976  . Cataract    both  eyes-surgery  . Chronic anxiety   . Chronic depression   . Diverticulosis of colon    patient said it may have been diverticulitis on cipro for a long period  . Dyslipidemia   . Hemorrhoids   . Hyperlipidemia   . Hypertension   . Internal hemorrhoid   . Macular degeneration   . Osteoporosis    neck and hip osteopenia   Past Surgical History:  Procedure Laterality Date  . ABDOMINAL HYSTERECTOMY    . CATARACT EXTRACTION     left eye 05-13-2013  . CHOLECYSTECTOMY    . COLON SURGERY  2008  . COLONOSCOPY    . laparotomy and removal of polyp    . sigmoid colectomy     Family History  Problem Relation Age of Onset  . Fibromyalgia Mother   . Hypertension Mother   . Other Mother        "hallucinations" and fibromyalgia  . Ovarian cysts Mother        "growths"  . Heart disease Father   . Diabetes Father   . Heart attack Father 40       d. due to 2nd heart attack at 11  . Breast cancer Maternal Aunt        dx 35s; s/p mastectomy  . Other Maternal Aunt        hx of colon surgery in her 57s, lim  info  . COPD Brother   . Heart attack Brother   . Kidney disease Brother        smoker; d. 51  . Bipolar disorder Brother   . Skin cancer Brother        described as a "black mole"  . Diabetes Brother   . Endometriosis Daughter   . Other Daughter        hx of breast biopsies  . Diabetes Paternal Aunt   . AAA (abdominal aortic aneurysm) Paternal Aunt   . Stroke Maternal Grandfather   . Other Maternal Grandfather        had an operation on his intestines to address constipation  . Stroke Paternal Aunt   . Stroke Paternal Aunt   . Diabetes Paternal Uncle   . Cancer Cousin        d. NOS cancer (maybe liver or lung cancer); +EtOH  . Diabetes Paternal Grandmother   . Other Paternal Grandmother        intestinal issues  . Stroke Paternal Grandfather   . Bipolar disorder Other   . Colon cancer Neg Hx   . Rectal cancer Neg Hx   . Stomach cancer Neg Hx   . Esophageal cancer Neg  Hx   . Colon polyps Neg Hx    Social History   Socioeconomic History  . Marital status: Married    Spouse name: Not on file  . Number of children: 3  . Years of education: Not on file  . Highest education level: Not on file  Occupational History  . Not on file  Social Needs  . Financial resource strain: Not hard at all  . Food insecurity:    Worry: Never true    Inability: Never true  . Transportation needs:    Medical: No    Non-medical: No  Tobacco Use  . Smoking status: Never Smoker  . Smokeless tobacco: Never Used  Substance and Sexual Activity  . Alcohol use: No    Alcohol/week: 0.0 standard drinks  . Drug use: No  . Sexual activity: Not Currently  Lifestyle  . Physical activity:    Days per week: 0 days    Minutes per session: 0 min  . Stress: Patient refused  Relationships  . Social connections:    Talks on phone: More than three times a week    Gets together: More than three times a week    Attends religious service: Not on file    Active member of club or organization: Not on file    Attends meetings of clubs or organizations: Not on file    Relationship status: Not on file  Other Topics Concern  . Not on file  Social History Narrative   Married 1968   2 sons, 1 daughter lost one son in [place]   Full-time homemaker    Outpatient Encounter Medications as of 11/22/2017  Medication Sig  . acetaminophen (TYLENOL) 500 MG tablet Take 500 mg by mouth every 6 (six) hours as needed.  Marland Kitchen aspirin (BAYER LOW DOSE) 81 MG EC tablet   . atenolol (TENORMIN) 50 MG tablet TAKE 1 TABLET BY MOUTH  DAILY  . Cholecalciferol 2000 units TABS Take 1 tablet (2,000 Units total) by mouth daily.  . fosinopril-hydrochlorothiazide (MONOPRIL-HCT) 20-12.5 MG tablet Take 1 tablet by mouth daily.  Javier Docker Oil 300 MG CAPS Take 900 mg by mouth.   . Multiple Vitamins-Minerals (ICAPS AREDS 2 PO) Take 1 capsule by mouth 2 (two) times  daily.  . simvastatin (ZOCOR) 20 MG tablet Take 1 tablet (20  mg total) by mouth daily at 6 PM. Please keep upcoming appt in December with Dr. Harrington Challenger for future refills. Thank you  . [DISCONTINUED] clorazepate (TRANXENE) 7.5 MG tablet TAKE 1 TABLET BY MOUTH 4  TIMES DAILY AS NEEDED FOR  ANXIETY   No facility-administered encounter medications on file as of 11/22/2017.     Activities of Daily Living In your present state of health, do you have any difficulty performing the following activities: 11/22/2017  Hearing? N  Vision? N  Difficulty concentrating or making decisions? N  Walking or climbing stairs? N  Dressing or bathing? N  Doing errands, shopping? N  Preparing Food and eating ? N  Using the Toilet? N  In the past six months, have you accidently leaked urine? N  Do you have problems with loss of bowel control? N  Managing your Medications? N  Managing your Finances? N  Housekeeping or managing your Housekeeping? N  Some recent data might be hidden    Patient Care Team: [name], MD as PCP - General (Internal Medicine) Fay Records, MD as Consulting Physician (Cardiology) Druscilla Brownie, MD as Consulting Physician (Dermatology) Marygrace Drought, MD as Referring Physician (Student)    Assessment:   This is a routine wellness examination for Stephanie Pham. Physical assessment deferred to PCP.   Exercise Activities and Dietary recommendations Current Exercise Habits: The patient does not participate in regular exercise at present(Chair and balance exerice print-out provided), Exercise limited by: orthopedic condition(s)  Diet (meal preparation, eat out, water intake, caffeinated beverages, dairy products, fruits and vegetables): in general, a "healthy" diet     Reviewed heart healthy diet. Discussed weight loss strategies and diet. Encouraged patient to increase daily water and healthy fluid intake. Relevant patient education assigned to patient using Emmi.  Goals    . Exercise 150 minutes per week (moderate activity)     Will start  stationary bike riding and build up to 30 min 3 to 4 times per day  Fat free or low fat dairy products Fish high in omega-3 acids ( salmon, tuna, trout) Fruits, such as apples, bananas, oranges, pears, prunes Legumes, such as kidney beans, lentils, checkpeas, black-eyed peas and lima beans Vegetables; broccoli, cabbage, carrots Whole grains;   Plant fats are better; decrease "white" foods as pasta, rice, bread and desserts, sugar; Avoid red meat (limiting) palm and coconut oils; sugary foods and beverages  Two nutrients that raise blood chol levels are saturated fats and trans fat; in hydrogenated oils and fats, as stick margarine, baked goods (cookes, cakes, pies, crackers; frosting; and coffee creamers;   Some Fats lower cholesterol: Monounsaturated and polyunsaturated  Avocados Corn, sunflower, and soybean oils Nuts and seeds, such as walnuts Olive, canola, peanut, safflower, and sesame oils Peanut butter Salmon and trout Tofu       . Exercise 150 minutes per week (moderate activity)     Pedometer from [facility] Will track walking and start with 20 minutes x 5 days a week ( can do 10 minutes at a time)     . Patient Stated     Lose weight by decreasing the amount of junk food I eat. I plan to start to do chair and balance exercises. Get up and do more around the house.    . Reduce fat intake to X grams per day     Keep trying to improve her diet;  Lowered fat overall  Fall Risk Fall Risk  11/22/2017 11/21/2016 10/31/2015 12/28/2014 12/22/2014  Falls in the past year? Yes No No No No  Number falls in past yr: 1 - - - -  Injury with Fall? No - - - -  Risk for fall due to : Impaired balance/gait;Impaired mobility - - - -  Risk for fall due to: Comment balance exericse print-out provided - - - -  Follow up Education provided;Falls prevention discussed - - - -    Depression Screen PHQ 2/9 Scores 11/22/2017 11/21/2016 10/31/2015 12/28/2014  PHQ - 2 Score 1 1 0 0  PHQ- 9 Score 3 2 -  0     Cognitive Function MMSE - Mini Mental State Exam 10/31/2015  Not completed: (No Data)       Ad8 score reviewed for issues:  Issues making decisions: no  Less interest in hobbies / activities: no  Repeats questions, stories (family complaining): no  Trouble using ordinary gadgets (microwave, computer, phone):no  Forgets the month or year: no  Mismanaging finances: no  Remembering appts: no  Daily problems with thinking and/or memory: no Ad8 score is= 0  Immunization History  Administered Date(s) Administered  . Influenza, High Dose Seasonal PF 12/27/2015, 11/21/2016  . Influenza,inj,Quad PF,6+ Mos 02/09/2013, 11/17/2013, 12/22/2014  . Pneumococcal Conjugate-13 12/28/2014  . Pneumococcal Polysaccharide-23 12/27/2015  . Tetanus 02/09/2013   Screening Tests Health Maintenance  Topic Date Due  . INFLUENZA VACCINE  10/17/2017  . MAMMOGRAM  11/29/2018  . COLONOSCOPY  11/15/2021  . TETANUS/TDAP  02/10/2023  . DEXA SCAN  Completed  . Hepatitis C Screening  Completed  . PNA vac Low Risk Adult  Completed      Plan:    Rollator walker ordered for patient's safety and to help maintain patient's independence and mobility.  Flu vaccine not administered today as patient has recently been sick. She plans to receive vaccine 12/23/17 during next PCP visit.   Continue doing brain stimulating activities (puzzles, reading, adult coloring books, staying active) to keep memory sharp.   Continue to eat heart healthy diet (full of fruits, vegetables, whole grains, lean protein, water--limit salt, fat, and sugar intake) and increase physical activity as tolerated.  I have personally reviewed and noted the following in the patient's chart:   . Medical and social history . Use of alcohol, tobacco or illicit drugs  . Current medications and supplements . Functional ability and status . Nutritional status . Physical activity . Advanced directives . List of other  physicians . Vitals . Screenings to include cognitive, depression, and falls . Referrals and appointments  In addition, I have reviewed and discussed with patient certain preventive protocols, quality metrics, and best practice recommendations. A written personalized care plan for preventive services as well as general preventive health recommendations were provided to patient.     Michiel Cowboy, RN  11/22/2017

## 2017-11-22 ENCOUNTER — Telehealth: Payer: Self-pay | Admitting: *Deleted

## 2017-11-22 ENCOUNTER — Other Ambulatory Visit: Payer: Self-pay | Admitting: Internal Medicine

## 2017-11-22 ENCOUNTER — Ambulatory Visit (INDEPENDENT_AMBULATORY_CARE_PROVIDER_SITE_OTHER): Payer: Medicare Other | Admitting: *Deleted

## 2017-11-22 VITALS — BP 136/82 | HR 60 | Temp 98.3°F | Resp 18 | Ht 64.0 in | Wt 239.0 lb

## 2017-11-22 DIAGNOSIS — Z0000 Encounter for general adult medical examination without abnormal findings: Secondary | ICD-10-CM

## 2017-11-22 DIAGNOSIS — F411 Generalized anxiety disorder: Secondary | ICD-10-CM

## 2017-11-22 DIAGNOSIS — R29898 Other symptoms and signs involving the musculoskeletal system: Secondary | ICD-10-CM

## 2017-11-22 DIAGNOSIS — Z9181 History of falling: Secondary | ICD-10-CM | POA: Diagnosis not present

## 2017-11-22 MED ORDER — CLORAZEPATE DIPOTASSIUM 7.5 MG PO TABS
ORAL_TABLET | ORAL | 0 refills | Status: DC
Start: 2017-11-22 — End: 2018-01-24

## 2017-11-22 NOTE — Telephone Encounter (Signed)
During AWV, patient stated she needed a medication refill for clorazepate.

## 2017-11-22 NOTE — Patient Instructions (Addendum)
If you or someone you know has experienced the death of a loved one, the support of others can play an invaluable role in the healing process. [facility] offers grief and loss services for anyone in the community. Contact us today [phone]  Continue doing brain stimulating activities (puzzles, reading, adult coloring books, staying active) to keep memory sharp.   Continue to eat heart healthy diet (full of fruits, vegetables, whole grains, lean protein, water--limit salt, fat, and sugar intake) and increase physical activity as tolerated.   Stephanie Pham , Thank you for taking time to come for your Medicare Wellness Visit. I appreciate your ongoing commitment to your health goals. Please review the following plan we discussed and let me know if I can assist you in the future.   These are the goals we discussed: Goals    . Exercise 150 minutes per week (moderate activity)     Will start stationary bike riding and build up to 30 min 3 to 4 times per day  Fat free or low fat dairy products Fish high in omega-3 acids ( salmon, tuna, trout) Fruits, such as apples, bananas, oranges, pears, prunes Legumes, such as kidney beans, lentils, checkpeas, black-eyed peas and lima beans Vegetables; broccoli, cabbage, carrots Whole grains;   Plant fats are better; decrease "white" foods as pasta, rice, bread and desserts, sugar; Avoid red meat (limiting) palm and coconut oils; sugary foods and beverages  Two nutrients that raise blood chol levels are saturated fats and trans fat; in hydrogenated oils and fats, as stick margarine, baked goods (cookes, cakes, pies, crackers; frosting; and coffee creamers;   Some Fats lower cholesterol: Monounsaturated and polyunsaturated  Avocados Corn, sunflower, and soybean oils Nuts and seeds, such as walnuts Olive, canola, peanut, safflower, and sesame oils Peanut butter Salmon and trout Tofu       . Exercise 150 minutes per week (moderate  activity)     Pedometer from [facility] Will track walking and start with 20 minutes x 5 days a week ( can do 10 minutes at a time)     . Patient Stated     Lose weight by decreasing the amount of junk food I eat. I plan to start to do chair and balance exercises. Get up and do more around the house.    . Reduce fat intake to X grams per day     Keep trying to improve her diet;  Lowered fat overall         This is a list of the screening recommended for you and due dates:  Health Maintenance  Topic Date Due  . Flu Shot  10/17/2017  . Mammogram  11/29/2018  . Colon Cancer Screening  11/15/2021  . Tetanus Vaccine  02/10/2023  . DEXA scan (bone density measurement)  Completed  .  Hepatitis C: One time screening is recommended by Center for Disease Control  (CDC) for  adults born from [birth year] through 1966.   Completed  . Pneumonia vaccines  Completed   Health Maintenance, Female Adopting a healthy lifestyle and getting preventive care can go a long way to promote health and wellness. Talk with your health care provider about what schedule of regular examinations is right for you. This is a good chance for you to check in with your provider about disease prevention and staying healthy. In between checkups, there are plenty of things you can do on your own. Experts have done a lot of research about which  lifestyle changes and preventive measures are most likely to keep you healthy. Ask your health care provider for more information. Weight and diet Eat a healthy diet  Be sure to include plenty of vegetables, fruits, low-fat dairy products, and lean protein.  Do not eat a lot of foods high in solid fats, added sugars, or salt.  Get regular exercise. This is one of the most important things you can do for your health. ? Most adults should exercise for at least 150 minutes each week. The exercise should increase your heart rate and make you sweat (moderate-intensity exercise). ? Most adults should  also do strengthening exercises at least twice a week. This is in addition to the moderate-intensity exercise.  Maintain a healthy weight  Body mass index (BMI) is a measurement that can be used to identify possible weight problems. It estimates body fat based on height and weight. Your health care provider can help determine your BMI and help you achieve or maintain a healthy weight.  For females 44 years of age and older: ? A BMI below 18.5 is considered underweight. ? A BMI of 18.5 to 24.9 is normal. ? A BMI of 25 to 29.9 is considered overweight. ? A BMI of 30 and above is considered obese.  Watch levels of cholesterol and blood lipids  You should start having your blood tested for lipids and cholesterol at 68 years of age, then have this test every 5 years.  You may need to have your cholesterol levels checked more often if: ? Your lipid or cholesterol levels are high. ? You are older than 68 years of age. ? You are at high risk for heart disease.  Cancer screening Lung Cancer  Lung cancer screening is recommended for adults 4-56 years old who are at high risk for lung cancer because of a history of smoking.  A yearly low-dose CT scan of the lungs is recommended for people who: ? Currently smoke. ? Have quit within the past 15 years. ? Have at least a 30-pack-year history of smoking. A pack year is smoking an average of one pack of cigarettes a day for 1 year.  Yearly screening should continue until it has been 15 years since you quit.  Yearly screening should stop if you develop a health problem that would prevent you from having lung cancer treatment.  Breast Cancer  Practice breast self-awareness. This means understanding how your breasts normally appear and feel.  It also means doing regular breast self-exams. Let your health care provider know about any changes, no matter how small.  If you are in your 20s or 30s, you should have a clinical breast exam (CBE) by a  health care provider every 1-3 years as part of a regular health exam.  If you are 57 or older, have a CBE every year. Also consider having a breast X-ray (mammogram) every year.  If you have a family history of breast cancer, talk to your health care provider about genetic screening.  If you are at high risk for breast cancer, talk to your health care provider about having an MRI and a mammogram every year.  Breast cancer gene (BRCA) assessment is recommended for women who have family members with BRCA-related cancers. BRCA-related cancers include: ? Breast. ? Ovarian. ? Tubal. ? Peritoneal cancers.  Results of the assessment will determine the need for genetic counseling and BRCA1 and BRCA2 testing.  Cervical Cancer Your health care provider may recommend that you be screened regularly  for cancer of the pelvic organs (ovaries, uterus, and vagina). This screening involves a pelvic examination, including checking for microscopic changes to the surface of your cervix (Pap test). You may be encouraged to have this screening done every 3 years, beginning at age 97.  For women ages 50-65, health care providers may recommend pelvic exams and Pap testing every 3 years, or they may recommend the Pap and pelvic exam, combined with testing for human papilloma virus (HPV), every 5 years. Some types of HPV increase your risk of cervical cancer. Testing for HPV may also be done on women of any age with unclear Pap test results.  Other health care providers may not recommend any screening for nonpregnant women who are considered low risk for pelvic cancer and who do not have symptoms. Ask your health care provider if a screening pelvic exam is right for you.  If you have had past treatment for cervical cancer or a condition that could lead to cancer, you need Pap tests and screening for cancer for at least 20 years after your treatment. If Pap tests have been discontinued, your risk factors (such as having  a new sexual partner) need to be reassessed to determine if screening should resume. Some women have medical problems that increase the chance of getting cervical cancer. In these cases, your health care provider may recommend more frequent screening and Pap tests.  Colorectal Cancer  This type of cancer can be detected and often prevented.  Routine colorectal cancer screening usually begins at 68 years of age and continues through 68 years of age.  Your health care provider may recommend screening at an earlier age if you have risk factors for colon cancer.  Your health care provider may also recommend using home test kits to check for hidden blood in the stool.  A small camera at the end of a tube can be used to examine your colon directly (sigmoidoscopy or colonoscopy). This is done to check for the earliest forms of colorectal cancer.  Routine screening usually begins at age 1.  Direct examination of the colon should be repeated every 5-10 years through 68 years of age. However, you may need to be screened more often if early forms of precancerous polyps or small growths are found.  Skin Cancer  Check your skin from head to toe regularly.  Tell your health care provider about any new moles or changes in moles, especially if there is a change in a mole's shape or color.  Also tell your health care provider if you have a mole that is larger than the size of a pencil eraser.  Always use sunscreen. Apply sunscreen liberally and repeatedly throughout the day.  Protect yourself by wearing long sleeves, pants, a wide-brimmed hat, and sunglasses whenever you are outside.  Heart disease, diabetes, and high blood pressure  High blood pressure causes heart disease and increases the risk of stroke. High blood pressure is more likely to develop in: ? People who have blood pressure in the high end of the normal range (130-139/85-89 mm Hg). ? People who are overweight or obese. ? People who  are African American.  If you are 23-60 years of age, have your blood pressure checked every 3-5 years. If you are 40 years of age or older, have your blood pressure checked every year. You should have your blood pressure measured twice-once when you are at a hospital or clinic, and once when you are not at a hospital or clinic.  Record the average of the two measurements. To check your blood pressure when you are not at a hospital or clinic, you can use: ? An automated blood pressure machine at a pharmacy. ? A home blood pressure monitor.  If you are between 31 years and 63 years old, ask your health care provider if you should take aspirin to prevent strokes.  Have regular diabetes screenings. This involves taking a blood sample to check your fasting blood sugar level. ? If you are at a normal weight and have a low risk for diabetes, have this test once every three years after 68 years of age. ? If you are overweight and have a high risk for diabetes, consider being tested at a younger age or more often. Preventing infection Hepatitis B  If you have a higher risk for hepatitis B, you should be screened for this virus. You are considered at high risk for hepatitis B if: ? You were born in a country where hepatitis B is common. Ask your health care provider which countries are considered high risk. ? Your parents were born in a high-risk country, and you have not been immunized against hepatitis B (hepatitis B vaccine). ? You have HIV or AIDS. ? You use needles to inject street drugs. ? You live with someone who has hepatitis B. ? You have had sex with someone who has hepatitis B. ? You get hemodialysis treatment. ? You take certain medicines for conditions, including cancer, organ transplantation, and autoimmune conditions.  Hepatitis C  Blood testing is recommended for: ? Everyone born from [birth year] through 1966. ? Anyone with known risk factors for hepatitis C.  Sexually transmitted  infections (STIs)  You should be screened for sexually transmitted infections (STIs) including gonorrhea and chlamydia if: ? You are sexually active and are younger than 68 years of age. ? You are older than 68 years of age and your health care provider tells you that you are at risk for this type of infection. ? Your sexual activity has changed since you were last screened and you are at an increased risk for chlamydia or gonorrhea. Ask your health care provider if you are at risk.  If you do not have HIV, but are at risk, it may be recommended that you take a prescription medicine daily to prevent HIV infection. This is called pre-exposure prophylaxis (PrEP). You are considered at risk if: ? You are sexually active and do not regularly use condoms or know the HIV status of your partner(s). ? You take drugs by injection. ? You are sexually active with a partner who has HIV.  Talk with your health care provider about whether you are at high risk of being infected with HIV. If you choose to begin PrEP, you should first be tested for HIV. You should then be tested every 3 months for as long as you are taking PrEP. Pregnancy  If you are premenopausal and you may become pregnant, ask your health care provider about preconception counseling.  If you may become pregnant, take 400 to 800 micrograms (mcg) of folic acid every day.  If you want to prevent pregnancy, talk to your health care provider about birth control (contraception). Osteoporosis and menopause  Osteoporosis is a disease in which the bones lose minerals and strength with aging. This can result in serious bone fractures. Your risk for osteoporosis can be identified using a bone density scan.  If you are 30 years of age or older, or if you  are at risk for osteoporosis and fractures, ask your health care provider if you should be screened.  Ask your health care provider whether you should take a calcium or vitamin D supplement to lower  your risk for osteoporosis.  Menopause may have certain physical symptoms and risks.  Hormone replacement therapy may reduce some of these symptoms and risks. Talk to your health care provider about whether hormone replacement therapy is right for you. Follow these instructions at home:  Schedule regular health, dental, and eye exams.  Stay current with your immunizations.  Do not use any tobacco products including cigarettes, chewing tobacco, or electronic cigarettes.  If you are pregnant, do not drink alcohol.  If you are breastfeeding, limit how much and how often you drink alcohol.  Limit alcohol intake to no more than 1 drink per day for nonpregnant women. One drink equals 12 ounces of beer, 5 ounces of Artem Bunte, or 1 ounces of hard liquor.  Do not use street drugs.  Do not share needles.  Ask your health care provider for help if you need support or information about quitting drugs.  Tell your health care provider if you often feel depressed.  Tell your health care provider if you have ever been abused or do not feel safe at home. This information is not intended to replace advice given to you by your health care provider. Make sure you discuss any questions you have with your health care provider. Document Released: 09/18/2010 Document Revised: 08/11/2015 Document Reviewed: 12/07/2014 Elsevier Interactive Patient Education  [employer].  It is important to avoid accidents which may result in broken bones.  Here are a few ideas on how to make your home safer so you will be less likely to trip or fall.  1. Use nonskid mats or non slip strips in your shower or tub, on your bathroom floor and around sinks.  If you know that you have spilled water, wipe it up! 2. In the bathroom, it is important to have properly installed grab bars on the walls or on the edge of the tub.  Towel racks are NOT strong enough for you to hold onto or to pull on for support. 3. Stairs and  hallways should have enough light.  Add lamps or night lights if you need ore light. 4. It is good to have handrails on both sides of the stairs if possible.  Always fix broken handrails right away. 5. It is important to see the edges of steps.  Paint the edges of outdoor steps white so you can see them better.  Put colored tape on the edge of inside steps. 6. Throw-rugs are dangerous because they can slide.  Removing the rugs is the best idea, but if they must stay, add adhesive carpet tape to prevent slipping. 7. Do not keep things on stairs or in the halls.  Remove small furniture that blocks the halls as it may cause you to trip.  Keep telephone and electrical cords out of the way where you walk. 8. Always were sturdy, rubber-soled shoes for good support.  Never wear just socks, especially on the stairs.  Socks may cause you to slip or fall.  Do not wear full-length housecoats as you can easily trip on the bottom.  9. Place the things you use the most on the shelves that are the easiest to reach.  If you use a stepstool, make sure it is in good condition.  If you feel unsteady, DO NOT  climb, ask for help. 10. If a health professional advises you to use a cane or walker, do not be ashamed.  These items can keep you from falling and breaking your bones.

## 2017-11-22 NOTE — Progress Notes (Signed)
Medical screening examination/treatment/procedure(s) were performed by the Wellness Coach, RN. As primary care provider I was immediately available for consulation/collaboration. I agree with above documentation. Saben Donigan, NP  

## 2017-11-22 NOTE — Progress Notes (Signed)
Medical screening examination/treatment/procedure(s) were performed by the Wellness Coach, RN. As primary care provider I was immediately available for consulation/collaboration. I agree with above documentation. Lalo Tromp, NP  

## 2017-11-26 DIAGNOSIS — R29898 Other symptoms and signs involving the musculoskeletal system: Secondary | ICD-10-CM | POA: Diagnosis not present

## 2017-11-26 DIAGNOSIS — Z9181 History of falling: Secondary | ICD-10-CM | POA: Diagnosis not present

## 2017-12-02 DIAGNOSIS — Z1231 Encounter for screening mammogram for malignant neoplasm of breast: Secondary | ICD-10-CM | POA: Diagnosis not present

## 2017-12-02 DIAGNOSIS — M8588 Other specified disorders of bone density and structure, other site: Secondary | ICD-10-CM | POA: Diagnosis not present

## 2017-12-02 LAB — HM DEXA SCAN

## 2017-12-02 LAB — HM MAMMOGRAPHY

## 2017-12-10 ENCOUNTER — Encounter: Payer: Self-pay | Admitting: Internal Medicine

## 2017-12-23 ENCOUNTER — Encounter: Payer: Self-pay | Admitting: Internal Medicine

## 2017-12-23 ENCOUNTER — Ambulatory Visit: Payer: Medicare Other | Admitting: Internal Medicine

## 2017-12-23 VITALS — BP 144/72 | HR 66 | Temp 99.0°F | Resp 16 | Ht 64.0 in | Wt 242.0 lb

## 2017-12-23 DIAGNOSIS — Z23 Encounter for immunization: Secondary | ICD-10-CM | POA: Diagnosis not present

## 2017-12-23 DIAGNOSIS — I10 Essential (primary) hypertension: Secondary | ICD-10-CM | POA: Diagnosis not present

## 2017-12-23 DIAGNOSIS — E781 Pure hyperglyceridemia: Secondary | ICD-10-CM

## 2017-12-23 DIAGNOSIS — R739 Hyperglycemia, unspecified: Secondary | ICD-10-CM

## 2017-12-23 DIAGNOSIS — M8580 Other specified disorders of bone density and structure, unspecified site: Secondary | ICD-10-CM | POA: Diagnosis not present

## 2017-12-23 DIAGNOSIS — D892 Hypergammaglobulinemia, unspecified: Secondary | ICD-10-CM | POA: Diagnosis not present

## 2017-12-23 DIAGNOSIS — E559 Vitamin D deficiency, unspecified: Secondary | ICD-10-CM

## 2017-12-23 DIAGNOSIS — E785 Hyperlipidemia, unspecified: Secondary | ICD-10-CM

## 2017-12-23 MED ORDER — FOSTEUM PLUS PO CAPS: 1.0000 | ORAL_CAPSULE | Freq: Two times a day (BID) | ORAL | 11 refills | Status: DC

## 2017-12-23 NOTE — Patient Instructions (Signed)
Preventing Osteoporosis, Adult Osteoporosis is a condition that causes the bones to get weaker. With osteoporosis, the bones become thinner, and the normal spaces in bone tissue become larger. This can make the bones weak and cause them to break more easily. People who have osteoporosis are more likely to break their wrist, spine, or hip. Even a minor accident or injury can be enough to break weak bones. Osteoporosis can occur with aging. Your body constantly replaces old bone tissue with new tissue. As you get older, you may lose bone tissue more quickly, or it may be replaced more slowly. Osteoporosis is more likely to develop if you have poor nutrition or do not get enough calcium or vitamin D. Other lifestyle factors can also play a role. By making some diet and lifestyle changes, you can help to keep your bones healthy and help to prevent osteoporosis. What nutrition changes can be made? Nutrition plays an important role in maintaining healthy, strong bones.  Make sure you get enough calcium every day from food or from calcium supplements. ? If you are age 50 or younger, aim to get 1,000 mg of calcium every day. ? If you are older than age 50, aim to get 1,200 mg of calcium every day.  Try to get enough vitamin D every day. ? If you are age 70 or younger, aim to get 600 international units (IU) every day. ? If you are older than age 70, aim to get 800 international units every day.  Follow a healthy diet. Eat plenty of foods that contain calcium and vitamin D. ? Calcium is in milk, cheese, yogurt, and other dairy products. Some fish and vegetables are also good sources of calcium. Many foods such as cereals and breads have had calcium added to them (are fortified). Check nutrition labels to see how much calcium is in a food or drink. ? Foods that contain vitamin D include milk, cereals, salmon, and tuna. Your body also makes vitamin D when you are out in the sun. Bare skin exposure to the sun on  your face, arms, legs, or back for no more than 30 minutes a day, 2 times per week is more than enough. Beyond that, it is important to use sunblock to protect your skin from sunburn, which increases your risk for skin cancer.  What lifestyle changes can be made? Making changes in your everyday life can also play an important role in preventing osteoporosis.  Stay active and get exercise every day. Ask your health care provider what types of exercise are best for you.  Do not use any products that contain nicotine or tobacco, such as cigarettes and e-cigarettes. If you need help quitting, ask your health care provider.  Limit alcohol intake to no more than 1 drink a day for nonpregnant women and 2 drinks a day for men. One drink equals 12 oz of beer, 5 oz of wine, or 1 oz of hard liquor.  Why are these changes important? Making these nutrition and lifestyle changes can:  Help you develop and maintain healthy, strong bones.  Prevent loss of bone mass and the problems that are caused by that loss, such as broken bones and delayed healing.  Make you feel better mentally and physically.  What can happen if changes are not made? Problems that can result from osteoporosis can be very serious. These may include:  A higher risk of broken bones that are painful and do not heal well.  Physical malformations, such as   a collapsed spine or a hunched back.  Problems with movement.  Where to find support: If you need help making changes to prevent osteoporosis, talk with your health care provider. You can ask for a referral to a diet and nutrition specialist (dietitian) and a physical therapist. Where to find more information: Learn more about osteoporosis from:  NIH Osteoporosis and Related Bone Diseases National Resource Center: www.niams.nih.gov/health_info/bone/osteoporosis/osteoporosis_ff.asp  U.S. Office on Women's Health:  www.womenshealth.gov/publications/our-publications/fact-sheet/osteoporosis.html  National Osteoporosis Foundation: www.nof.org/patients/what-is-osteoporosis/  Summary  Osteoporosis is a condition that causes weak bones that are more likely to break.  Eating a healthy diet and making sure you get enough calcium and vitamin D can help prevent osteoporosis.  Other ways to reduce your risk of osteoporosis include getting regular exercise and avoiding alcohol and products that contain nicotine or tobacco. This information is not intended to replace advice given to you by your health care provider. Make sure you discuss any questions you have with your health care provider. Document Released: 03/20/2015 Document Revised: 11/14/2015 Document Reviewed: 11/14/2015 Elsevier Interactive Patient Education  2018 [employer]  

## 2017-12-23 NOTE — Progress Notes (Signed)
Subjective:  Patient ID: Stephanie Pham, female    DOB: 01-Aug-1950  Age: 67 y.o. MRN: [id number]  CC: Hypertension and Hyperlipidemia   HPI Stephanie Pham presents for f/up - She offers no new or worsening symptoms today.  She recently had a bone mineral density test that showed osteopenia.  She wants to know what she can do to prevent the development of osteoporosis.  She tells me her blood pressure has been well controlled.  She denies CP, DOE, palpitations, edema, or fatigue.  Outpatient Medications Prior to Visit  Medication Sig Dispense Refill  . acetaminophen (TYLENOL) 500 MG tablet Take 500 mg by mouth every 6 (six) hours as needed.    Marland Kitchen aspirin (BAYER LOW DOSE) 81 MG EC tablet     . atenolol (TENORMIN) 50 MG tablet TAKE 1 TABLET BY MOUTH  DAILY 90 tablet 1  . Cholecalciferol 2000 units TABS Take 1 tablet (2,000 Units total) by mouth daily. 90 tablet 3  . clorazepate (TRANXENE) 7.5 MG tablet TAKE 1 TABLET BY MOUTH 4  TIMES DAILY AS NEEDED FOR  ANXIETY 360 tablet 0  . fosinopril-hydrochlorothiazide (MONOPRIL-HCT) 20-12.5 MG tablet Take 1 tablet by mouth daily. 90 tablet 0  . Krill Oil 300 MG CAPS Take 900 mg by mouth.     . levofloxacin (QUIXIN) 0.5 % ophthalmic solution levofloxacin 0.5 % eye drops  1 DROP IN RIGHT EYE 4 TIMES A DAY STARTING 2 DAYS BEFORE SURGERY AND 2 DROPS THE MORNING OF SURGERY    . Multiple Vitamins-Minerals (ICAPS AREDS 2 PO) Take 1 capsule by mouth 2 (two) times daily.    . prednisoLONE acetate (PRED FORTE) 1 % ophthalmic suspension prednisolone acetate 1 % eye drops,suspension  PLACE 1 DROP INTO OPERATIVE EYE 4 TIMES A DAY AS DIRECTED INTO RIGHT EYE AFTER SURGERY    . simvastatin (ZOCOR) 20 MG tablet Take 1 tablet (20 mg total) by mouth daily at 6 PM. Please keep upcoming appt in December with Dr. Harrington Challenger for future refills. Thank you 90 tablet 1   No facility-administered medications prior to visit.     ROS Review of Systems  Constitutional: Negative.   Negative for appetite change, diaphoresis, fatigue and fever.  HENT: Negative.   Eyes: Negative for visual disturbance.  Respiratory: Negative for cough, chest tightness, shortness of breath and wheezing.   Cardiovascular: Negative for chest pain, palpitations and leg swelling.  Gastrointestinal: Negative for abdominal pain, constipation, diarrhea, nausea and vomiting.  Genitourinary: Negative.  Negative for difficulty urinating.  Musculoskeletal: Negative.  Negative for arthralgias and myalgias.  Skin: Negative.  Negative for color change, pallor and rash.  Neurological: Positive for tremors. Negative for dizziness, weakness and light-headedness.  Hematological: Negative for adenopathy. Does not bruise/bleed easily.  Psychiatric/Behavioral: Negative.     Objective:  BP (!) 144/72 (BP Location: Left Arm, Patient Position: Sitting, Cuff Size: Large)   Pulse 66   Temp 99 F (37.2 C) (Oral)   Resp 16   Ht 5\' 4"  (1.626 m)   Wt 242 lb (109.8 kg)   SpO2 97%   BMI 41.54 kg/m   BP Readings from Last 3 Encounters:  12/23/17 (!) 144/72  11/22/17 136/82  06/17/17 120/74    Wt Readings from Last 3 Encounters:  12/23/17 242 lb (109.8 kg)  11/22/17 239 lb (108.4 kg)  06/17/17 237 lb 1.3 oz (107.5 kg)    Physical Exam  Constitutional: She is oriented to person, place, and time. No distress.  HENT:  Mouth/Throat: Oropharynx is clear and moist. No oropharyngeal exudate.  Eyes: Conjunctivae are normal. No scleral icterus.  Neck: Normal range of motion. Neck supple. No JVD present. No thyromegaly present.  Cardiovascular: Normal rate, regular rhythm and normal heart sounds. Exam reveals no gallop.  No murmur heard. Pulmonary/Chest: Effort normal and breath sounds normal. No respiratory distress. She has no wheezes. She has no rales.  Abdominal: Soft. Bowel sounds are normal. She exhibits no mass. There is no hepatosplenomegaly. There is no tenderness.  Musculoskeletal: Normal range of  motion. She exhibits no edema, tenderness or deformity.  Lymphadenopathy:    She has no cervical adenopathy.  Neurological: She is alert and oriented to person, place, and time. She has normal strength. She displays tremor. She displays a negative Romberg sign. Coordination and gait normal.  Skin: Skin is warm and dry. She is not diaphoretic. No pallor.  Vitals reviewed.   Lab Results  Component Value Date   WBC 5.6 06/17/2017   HGB 13.4 06/17/2017   HCT 39.9 06/17/2017   PLT 258.0 06/17/2017   GLUCOSE 116 (H) 06/17/2017   CHOL 160 06/17/2017   TRIG 257.0 (H) 06/17/2017   HDL 51.60 06/17/2017   LDLDIRECT 79.0 06/17/2017   ALT 19 06/17/2017   AST 18 06/17/2017   NA 140 06/17/2017   K 3.6 06/17/2017   CL 101 06/17/2017   CREATININE 0.91 06/17/2017   BUN 11 06/17/2017   CO2 27 06/17/2017   TSH 2.10 10/09/2016   HGBA1C 5.6 06/17/2017    No results found.  Assessment & Plan:   Aima was seen today for hypertension and hyperlipidemia.  Diagnoses and all orders for this visit:  Essential hypertension- Her blood pressure is adequately well controlled.  I will monitor her electrolytes and renal function. -     CBC with Differential/Platelet; Future -     Urinalysis, Routine w reflex microscopic; Future  Need for influenza vaccination -     Flu vaccine HIGH DOSE PF (Fluzone High dose)  Osteopenia, unspecified location -     Dietary Management Product (FOSTEUM PLUS) CAPS; Take 1 capsule by mouth 2 (two) times daily.  Vitamin D deficiency -     VITAMIN D 25 Hydroxy (Vit-D Deficiency, Fractures); Future  Paraproteinemia- I will monitor her serum protein electrophoresis to see if she has developed lymphoproliferative disease. -     Comprehensive metabolic panel; Future -     Protein electrophoresis, serum; Future  Hypertriglyceridemia -     Lipid panel; Future  Hyperglycemia -     Hemoglobin A1c; Future  Hyperlipidemia with target LDL less than 130 -     Lipid panel;  Future -     TSH; Future   I am having Judieth Keens start on [employer]. I am also having her maintain her Cholecalciferol, Krill Oil, acetaminophen, aspirin, Multiple Vitamins-Minerals (ICAPS AREDS 2 PO), fosinopril-hydrochlorothiazide, atenolol, simvastatin, clorazepate, levofloxacin, and prednisoLONE acetate.  Meds ordered this encounter  Medications  . Dietary Management Product (FOSTEUM PLUS) CAPS    Sig: Take 1 capsule by mouth 2 (two) times daily.    Dispense:  60 capsule    Refill:  11     Follow-up: Return in about 6 months (around 06/24/2018).  Scarlette Calico, MD

## 2017-12-26 ENCOUNTER — Encounter: Payer: Self-pay | Admitting: Internal Medicine

## 2017-12-26 DIAGNOSIS — R29898 Other symptoms and signs involving the musculoskeletal system: Secondary | ICD-10-CM | POA: Diagnosis not present

## 2017-12-27 ENCOUNTER — Other Ambulatory Visit: Payer: Self-pay | Admitting: Internal Medicine

## 2017-12-27 DIAGNOSIS — I10 Essential (primary) hypertension: Secondary | ICD-10-CM

## 2018-01-24 ENCOUNTER — Other Ambulatory Visit: Payer: Self-pay | Admitting: Internal Medicine

## 2018-01-24 DIAGNOSIS — F411 Generalized anxiety disorder: Secondary | ICD-10-CM

## 2018-01-26 DIAGNOSIS — R29898 Other symptoms and signs involving the musculoskeletal system: Secondary | ICD-10-CM | POA: Diagnosis not present

## 2018-02-25 DIAGNOSIS — R29898 Other symptoms and signs involving the musculoskeletal system: Secondary | ICD-10-CM | POA: Diagnosis not present

## 2018-02-28 ENCOUNTER — Ambulatory Visit: Payer: Medicare Other | Admitting: Internal Medicine

## 2018-02-28 ENCOUNTER — Encounter: Payer: Self-pay | Admitting: Internal Medicine

## 2018-02-28 VITALS — BP 138/80 | HR 66 | Ht 64.0 in | Wt 248.2 lb

## 2018-02-28 DIAGNOSIS — I10 Essential (primary) hypertension: Secondary | ICD-10-CM

## 2018-02-28 DIAGNOSIS — E785 Hyperlipidemia, unspecified: Secondary | ICD-10-CM | POA: Diagnosis not present

## 2018-02-28 NOTE — Patient Instructions (Signed)
Medication Instructions:  No change If you need a refill on your cardiac medications before your next appointment, please call your pharmacy.   Lab work: none If you have labs (blood work) drawn today and your tests are completely normal, you will receive your results only by: Marland Kitchen MyChart Message (if you have MyChart) OR . A paper copy in the mail If you have any lab test that is abnormal or we need to change your treatment, we will call you to review the results.  Testing/Procedures: none  Follow-Up: At [facility], you and your health needs are our priority.  As part of our continuing mission to provide you with exceptional heart care, we have created designated Provider Care Teams.  These Care Teams include your primary Cardiologist (physician) and Advanced Practice Providers (APPs -  Physician Assistants and Nurse Practitioners) who all work together to provide you with the care you need, when you need it. You will need a follow up appointment in:  12 months.  Please call our office 2 months in advance to schedule this appointment.  You may see Dr. Harrington Challenger or one of the following Advanced Practice Providers on your designated Care Team: Richardson Dopp, PA-C [place], [place] . Daune Perch, NP  Any Other Special Instructions Will Be Listed Below (If Applicable).  Heart-Healthy Eating Plan Heart-healthy meal planning includes:  Limiting unhealthy fats.  Increasing healthy fats.  Making other small dietary changes.  You may need to talk with your doctor or a diet specialist (dietitian) to create an eating plan that is right for you. What types of fat should I choose?  Choose healthy fats. These include olive oil and canola oil, flaxseeds, walnuts, almonds, and seeds.  Eat more omega-3 fats. These include salmon, mackerel, sardines, tuna, flaxseed oil, and ground flaxseeds. Try to eat fish at least twice each week.  Limit saturated fats. ? Saturated fats are often found in  animal products, such as meats, butter, and cream. ? Plant sources of saturated fats include palm oil, palm kernel oil, and coconut oil.  Avoid foods with partially hydrogenated oils in them. These include stick margarine, some tub margarines, cookies, crackers, and other baked goods. These contain trans fats. What general guidelines do I need to follow?  Check food labels carefully. Identify foods with trans fats or high amounts of saturated fat.  Fill one half of your plate with vegetables and green salads. Eat 4-5 servings of vegetables per day. A serving of vegetables is: ? 1 cup of raw leafy vegetables. ?  cup of raw or cooked cut-up vegetables. ?  cup of vegetable juice.  Fill one fourth of your plate with whole grains. Look for the word "whole" as the first word in the ingredient list.  Fill one fourth of your plate with lean protein foods.  Eat 4-5 servings of fruit per day. A serving of fruit is: ? One medium whole fruit. ?  cup of dried fruit. ?  cup of fresh, frozen, or canned fruit. ?  cup of 100% fruit juice.  Eat more foods that contain soluble fiber. These include apples, broccoli, carrots, beans, peas, and barley. Try to get 20-30 g of fiber per day.  Eat more home-cooked food. Eat less restaurant, buffet, and fast food.  Limit or avoid alcohol.  Limit foods high in starch and sugar.  Avoid fried foods.  Avoid frying your food. Try baking, boiling, grilling, or broiling it instead. You can also reduce fat by: ? Removing  the skin from poultry. ? Removing all visible fats from meats. ? Skimming the fat off of stews, soups, and gravies before serving them. ? Steaming vegetables in water or broth.  Lose weight if you are overweight.  Eat 4-5 servings of nuts, legumes, and seeds per week: ? One serving of dried beans or legumes equals  cup after being cooked. ? One serving of nuts equals 1 ounces. ? One serving of seeds equals  ounce or one  tablespoon.  You may need to keep track of how much salt or sodium you eat. This is especially true if you have high blood pressure. Talk with your doctor or dietitian to get more information. What foods can I eat? Grains Breads, including [place], white, pita, wheat, raisin, rye, oatmeal, and [place]. Tortillas that are neither fried nor made with lard or trans fat. Low-fat rolls, including hotdog and hamburger buns and English muffins. Biscuits. Muffins. Waffles. Pancakes. Light popcorn. Whole-grain cereals. Flatbread. Melba toast. Pretzels. Breadsticks. Rusks. Low-fat snacks. Low-fat crackers, including oyster, saltine, matzo, graham, animal, and rye. Rice and pasta, including brown rice and pastas that are made with whole wheat. Vegetables All vegetables. Fruits All fruits, but limit coconut. Meats and Other Protein Sources Lean, well-trimmed beef, veal, pork, and lamb. Chicken and [place] without skin. All fish and shellfish. Wild duck, rabbit, pheasant, and venison. Egg whites or low-cholesterol egg substitutes. Dried beans, peas, lentils, and tofu. Seeds and most nuts. Dairy Low-fat or nonfat cheeses, including ricotta, string, and mozzarella. Skim or 1% milk that is liquid, powdered, or evaporated. Buttermilk that is made with low-fat milk. Nonfat or low-fat yogurt. Beverages Mineral water. Diet carbonated beverages. Sweets and Desserts Sherbets and fruit ices. Honey, jam, marmalade, jelly, and syrups. Meringues and gelatins. Pure sugar candy, such as hard candy, jelly beans, gumdrops, mints, marshmallows, and small amounts of dark chocolate. [employer] Eat all sweets and desserts in moderation. Fats and Oils Nonhydrogenated (trans-free) margarines. Vegetable oils, including soybean, sesame, sunflower, olive, peanut, safflower, corn, canola, and cottonseed. Salad dressings or mayonnaise made with a vegetable oil. Limit added fats and oils that you use for cooking, baking, salads, and  as spreads. Other Cocoa powder. Coffee and tea. All seasonings and condiments. The items listed above may not be a complete list of recommended foods or beverages. Contact your dietitian for more options. What foods are not recommended? Grains Breads that are made with saturated or trans fats, oils, or whole milk. Croissants. Butter rolls. Cheese breads. Sweet rolls. Donuts. Buttered popcorn. Chow mein noodles. High-fat crackers, such as cheese or butter crackers. Meats and Other Protein Sources Fatty meats, such as hotdogs, short ribs, sausage, spareribs, bacon, rib eye roast or steak, and mutton. High-fat deli meats, such as salami and bologna. Caviar. Domestic duck and goose. Organ meats, such as kidney, liver, sweetbreads, and heart. Dairy Cream, sour cream, cream cheese, and creamed cottage cheese. Whole-milk cheeses, including blue (bleu), Monte[place] Jack, [place], [place], American, [place], Swiss, cheddar, [place], and [place]. Whole or 2% milk that is liquid, evaporated, or condensed. Whole buttermilk. Cream sauce or high-fat cheese sauce. Yogurt that is made from whole milk. Beverages Regular sodas and juice drinks with added sugar. Sweets and Desserts Frosting. Pudding. Cookies. Cakes other than angel food cake. Candy that has milk chocolate or white chocolate, hydrogenated fat, butter, coconut, or unknown ingredients. Buttered syrups. Full-fat ice cream or ice cream drinks. Fats and Oils Gravy that has suet, meat fat, or shortening. Cocoa butter, hydrogenated oils,  palm oil, coconut oil, palm kernel oil. These can often be found in baked products, candy, fried foods, nondairy creamers, and whipped toppings. Solid fats and shortenings, including bacon fat, salt pork, lard, and butter. Nondairy cream substitutes, such as coffee creamers and sour cream substitutes. Salad dressings that are made of unknown oils, cheese, or sour cream. The items listed above may not be a complete list of foods  and beverages to avoid. Contact your dietitian for more information. This information is not intended to replace advice given to you by your health care provider. Make sure you discuss any questions you have with your health care provider. Document Released: 09/04/2011 Document Revised: 08/11/2015 Document Reviewed: 08/27/2013 Elsevier Interactive Patient Education  [employer].

## 2018-02-28 NOTE — Progress Notes (Signed)
Cardiology Office Note   Date:  02/28/2018   ID:  Stephanie Pham, DOB 10/20/1950, MRN [id number]  PCP:  [name], MD  Cardiologist:   Dorris Carnes, MD    Pt presents for f/u of HTN and SOB   History of Present Illness: Stephanie Pham is a 67 y.o. female with a history of HTN and dyspea I saw her in Nov 2018 Breathing is steady   No CP  Since seen she denies CP  Does get SOB with walking stairs  Chronic  No real worsening    Outpatient Medications Prior to Visit  Medication Sig Dispense Refill  . acetaminophen (TYLENOL) 500 MG tablet Take 500 mg by mouth every 6 (six) hours as needed.    Marland Kitchen aspirin (BAYER LOW DOSE) 81 MG EC tablet     . atenolol (TENORMIN) 50 MG tablet TAKE 1 TABLET BY MOUTH  DAILY 90 tablet 1  . Cholecalciferol 2000 units TABS Take 1 tablet (2,000 Units total) by mouth daily. 90 tablet 3  . clorazepate (TRANXENE) 7.5 MG tablet TAKE 1 TABLET BY MOUTH 4  TIMES DAILY AS NEEDED FOR  ANXIETY 360 tablet 1  . Dietary Management Product (FOSTEUM PLUS) CAPS Take 1 capsule by mouth 2 (two) times daily. 60 capsule 11  . fosinopril-hydrochlorothiazide (MONOPRIL-HCT) 20-12.5 MG tablet TAKE 1 TABLET BY MOUTH  DAILY 90 tablet 1  . Krill Oil 300 MG CAPS Take 900 mg by mouth.     . Multiple Vitamins-Minerals (ICAPS AREDS 2 PO) Take 1 capsule by mouth 2 (two) times daily.    . simvastatin (ZOCOR) 20 MG tablet Take 1 tablet (20 mg total) by mouth daily at 6 PM. Please keep upcoming appt in December with Dr. Harrington Challenger for future refills. Thank you 90 tablet 1  . levofloxacin (QUIXIN) 0.5 % ophthalmic solution levofloxacin 0.5 % eye drops  1 DROP IN RIGHT EYE 4 TIMES A DAY STARTING 2 DAYS BEFORE SURGERY AND 2 DROPS THE MORNING OF SURGERY    . prednisoLONE acetate (PRED FORTE) 1 % ophthalmic suspension prednisolone acetate 1 % eye drops,suspension  PLACE 1 DROP INTO OPERATIVE EYE 4 TIMES A DAY AS DIRECTED INTO RIGHT EYE AFTER SURGERY     No facility-administered medications prior  to visit.      Allergies:   Codeine; Iohexol; Ivp dye [iodinated diagnostic agents]; Minocycline; and Sulfonamide derivatives   Past Medical History:  Diagnosis Date  . Allergy   . Anemia   . Blood transfusion without reported diagnosis 1976  . Cancer ([place])    colon cancer 1976  . Cataract    both eyes-surgery  . Chronic anxiety   . Chronic depression   . Diverticulosis of colon    patient said it may have been diverticulitis on cipro for a long period  . Dyslipidemia   . Hemorrhoids   . Hyperlipidemia   . Hypertension   . Internal hemorrhoid   . Macular degeneration   . Osteoporosis    neck and hip osteopenia    Past Surgical History:  Procedure Laterality Date  . ABDOMINAL HYSTERECTOMY    . CATARACT EXTRACTION     left eye 05-13-2013  . CHOLECYSTECTOMY    . COLON SURGERY  2008  . COLONOSCOPY    . laparotomy and removal of polyp    . sigmoid colectomy       Social History:  The patient  reports that she has never smoked. She has never used smokeless tobacco.  She reports that she does not drink alcohol or use drugs.   Family History:  The patient's family history includes AAA (abdominal aortic aneurysm) in her paternal aunt; Bipolar disorder in her brother and another family member; Breast cancer in her maternal aunt; COPD in her brother; Cancer in her cousin; Diabetes in her brother, father, paternal aunt, paternal grandmother, and paternal uncle; Endometriosis in her daughter; Fibromyalgia in her mother; Heart attack in her brother; Heart attack (age of onset: 24) in her father; Heart disease in her father; Hypertension in her mother; Kidney disease in her brother; Other in her daughter, maternal aunt, maternal grandfather, mother, and paternal grandmother; Ovarian cysts in her mother; Skin cancer in her brother; Stroke in her maternal grandfather, paternal aunt, paternal aunt, and paternal grandfather.    ROS:  Please see the history of present illness. All other  systems are reviewed and  Negative to the above problem except as noted.    PHYSICAL EXAM: VS:  BP 138/80   Pulse 66   Ht 5\' 4"  (1.626 m)   Wt 248 lb 3.2 oz (112.6 kg)   BMI 42.60 kg/m   GEN: Morbidly obese 66  , in no acute distress  HEENT: normal  Neck: Neck is full  , carotid bruits, or masses Cardiac: RRR; no murmurs, rubs, or gallops,Tr e  dema  Respiratory:  clear to auscultation bilaterally, normal work of breathing GI: soft, nontender, nondistended, + BS  No hepatomegaly  MS: no deformity Moving all extremities   Skin: warm   Very dry   Neuro:  Strength and sensation are intact Psych: euthymic mood, full affect   EKG:  EKG is not ordered today   Lipid Panel    Component Value Date/Time   CHOL 160 06/17/2017 1039   TRIG 257.0 (H) 06/17/2017 1039   HDL 51.60 06/17/2017 1039   CHOLHDL 3 06/17/2017 1039   VLDL 51.4 (H) 06/17/2017 1039   LDLDIRECT 79.0 06/17/2017 1039      Wt Readings from Last 3 Encounters:  02/28/18 248 lb 3.2 oz (112.6 kg)  12/23/17 242 lb (109.8 kg)  11/22/17 239 lb (108.4 kg)      ASSESSMENT AND PLAN:  1  Dyspnea  Stable     2  HL  Lipids in April 2019 LDL 79  HDL 61  Trig 257   Eats carbs   Knows what to do  Modify diet   Exercise    2  HTN  BP is a little high   Follow at home  Try to lose wt    4  Morbid obesity  Counselled on diet and exercise    F/U based in 1 year      Current medicines are reviewed at length with the patient today.  The patient does not have concerns regarding medicines.  Signed, Dorris Carnes, MD  02/28/2018 8:27 AM    [address], [place], [place]  [address] Phone: [phone]; Fax: [phone]

## 2018-03-28 DIAGNOSIS — R29898 Other symptoms and signs involving the musculoskeletal system: Secondary | ICD-10-CM | POA: Diagnosis not present

## 2018-04-28 DIAGNOSIS — R29898 Other symptoms and signs involving the musculoskeletal system: Secondary | ICD-10-CM | POA: Diagnosis not present

## 2018-05-27 DIAGNOSIS — R29898 Other symptoms and signs involving the musculoskeletal system: Secondary | ICD-10-CM | POA: Diagnosis not present

## 2018-05-27 DIAGNOSIS — Z9181 History of falling: Secondary | ICD-10-CM | POA: Diagnosis not present

## 2018-06-27 DIAGNOSIS — R29898 Other symptoms and signs involving the musculoskeletal system: Secondary | ICD-10-CM | POA: Diagnosis not present

## 2018-06-27 DIAGNOSIS — Z9181 History of falling: Secondary | ICD-10-CM | POA: Diagnosis not present

## 2018-07-08 ENCOUNTER — Encounter: Payer: Self-pay | Admitting: Internal Medicine

## 2018-07-08 ENCOUNTER — Ambulatory Visit (INDEPENDENT_AMBULATORY_CARE_PROVIDER_SITE_OTHER): Payer: Medicare Other | Admitting: Internal Medicine

## 2018-07-08 DIAGNOSIS — R739 Hyperglycemia, unspecified: Secondary | ICD-10-CM | POA: Diagnosis not present

## 2018-07-08 DIAGNOSIS — D892 Hypergammaglobulinemia, unspecified: Secondary | ICD-10-CM

## 2018-07-08 DIAGNOSIS — I10 Essential (primary) hypertension: Secondary | ICD-10-CM | POA: Diagnosis not present

## 2018-07-08 DIAGNOSIS — E785 Hyperlipidemia, unspecified: Secondary | ICD-10-CM

## 2018-07-08 NOTE — Progress Notes (Signed)
Virtual Visit via Video Note  I connected with Stephanie Pham on 07/08/18 at  9:30 AM EDT by a video enabled telemedicine application and verified that I am speaking with the correct person using two identifiers.   I discussed the limitations of evaluation and management by telemedicine and the availability of in person appointments. The patient expressed understanding and agreed to proceed.  History of Present Illness: She checked in for a virtual visit.  She was not willing to come in due to the COVID-19 pandemic.  She has felt well recently.  Based on her symptoms she thinks her blood pressure is well controlled though she does not monitor her blood pressure.  She denies DOE, CP, palpitations, edema, fatigue, or weight loss, fever, chills, night sweats, abdominal pain, rash, or weight loss.    Observations/Objective: Obese.  Speech normal and fluent.  Calm, cooperative, appropriate.  No acute distress.  Lab Results  Component Value Date   WBC 5.6 06/17/2017   HGB 13.4 06/17/2017   HCT 39.9 06/17/2017   PLT 258.0 06/17/2017   GLUCOSE 116 (H) 06/17/2017   CHOL 160 06/17/2017   TRIG 257.0 (H) 06/17/2017   HDL 51.60 06/17/2017   LDLDIRECT 79.0 06/17/2017   ALT 19 06/17/2017   AST 18 06/17/2017   NA 140 06/17/2017   K 3.6 06/17/2017   CL 101 06/17/2017   CREATININE 0.91 06/17/2017   BUN 11 06/17/2017   CO2 27 06/17/2017   TSH 2.10 10/09/2016   HGBA1C 5.6 06/17/2017     Assessment and Plan: Based on her symptoms her blood pressure is probably well controlled though she does not monitor her blood pressure.  I have asked her to stop monitoring her blood pressure.  She is also due for annual labs including lipids, renal function, electrolytes, blood sugar, and thyroid functioning.  She is also due for follow-up regarding the paraproteinemia.  She does not have any symptoms related to this.  I have ordered an SPEP to see if she has developed lymphoproliferative disease.  She agrees to  come in in the next week to have these labs completed.  In the meantime, I encouraged to her to continue to work on her lifestyle modifications.   Follow Up Instructions: She agrees to be compliant with the current medication list.  She also agrees to improve her lifestyle modifications and to start monitoring her blood pressure.  She agrees to come in to have her labs done.    I discussed the assessment and treatment plan with the patient. The patient was provided an opportunity to ask questions and all were answered. The patient agreed with the plan and demonstrated an understanding of the instructions.   The patient was advised to call back or seek an in-person evaluation if the symptoms worsen or if the condition fails to improve as anticipated.  I provided 25 minutes of non-face-to-face time during this encounter.   Scarlette Calico, MD

## 2018-07-27 DIAGNOSIS — Z9181 History of falling: Secondary | ICD-10-CM | POA: Diagnosis not present

## 2018-07-27 DIAGNOSIS — R29898 Other symptoms and signs involving the musculoskeletal system: Secondary | ICD-10-CM | POA: Diagnosis not present

## 2018-07-31 ENCOUNTER — Other Ambulatory Visit: Payer: Self-pay | Admitting: Internal Medicine

## 2018-07-31 DIAGNOSIS — I10 Essential (primary) hypertension: Secondary | ICD-10-CM

## 2018-08-06 ENCOUNTER — Other Ambulatory Visit (INDEPENDENT_AMBULATORY_CARE_PROVIDER_SITE_OTHER): Payer: Medicare Other

## 2018-08-06 ENCOUNTER — Encounter: Payer: Self-pay | Admitting: Internal Medicine

## 2018-08-06 DIAGNOSIS — E785 Hyperlipidemia, unspecified: Secondary | ICD-10-CM | POA: Diagnosis not present

## 2018-08-06 DIAGNOSIS — I10 Essential (primary) hypertension: Secondary | ICD-10-CM

## 2018-08-06 DIAGNOSIS — R739 Hyperglycemia, unspecified: Secondary | ICD-10-CM

## 2018-08-06 DIAGNOSIS — D892 Hypergammaglobulinemia, unspecified: Secondary | ICD-10-CM

## 2018-08-06 LAB — CBC WITH DIFFERENTIAL/PLATELET
Basophils Absolute: 0 10*3/uL (ref 0.0–0.1)
Basophils Relative: 0.7 % (ref 0.0–3.0)
Eosinophils Absolute: 0.1 10*3/uL (ref 0.0–0.7)
Eosinophils Relative: 1.9 % (ref 0.0–5.0)
HCT: 38.5 % (ref 36.0–46.0)
Hemoglobin: 13.3 g/dL (ref 12.0–15.0)
Lymphocytes Relative: 25.9 % (ref 12.0–46.0)
Lymphs Abs: 1.3 10*3/uL (ref 0.7–4.0)
MCHC: 34.6 g/dL (ref 30.0–36.0)
MCV: 93 fl (ref 78.0–100.0)
Monocytes Absolute: 0.3 10*3/uL (ref 0.1–1.0)
Monocytes Relative: 6.8 % (ref 3.0–12.0)
Neutro Abs: 3.3 10*3/uL (ref 1.4–7.7)
Neutrophils Relative %: 64.7 % (ref 43.0–77.0)
Platelets: 221 10*3/uL (ref 150.0–400.0)
RBC: 4.14 Mil/uL (ref 3.87–5.11)
RDW: 13.2 % (ref 11.5–15.5)
WBC: 5.1 10*3/uL (ref 4.0–10.5)

## 2018-08-06 LAB — URINALYSIS, ROUTINE W REFLEX MICROSCOPIC
Bilirubin Urine: NEGATIVE
Hgb urine dipstick: NEGATIVE
Ketones, ur: NEGATIVE
Leukocytes,Ua: NEGATIVE
Nitrite: NEGATIVE
RBC / HPF: NONE SEEN (ref 0–?)
Specific Gravity, Urine: >=1.03 — AB (ref 1.000–1.030)
Total Protein, Urine: 30 — AB
Urine Glucose: NEGATIVE
Urobilinogen, UA: 0.2 (ref 0.0–1.0)
pH: 5 (ref 5.0–8.0)

## 2018-08-06 LAB — LIPID PANEL
Cholesterol: 183 mg/dL (ref 0–200)
HDL: 52.1 mg/dL (ref >39.00)
NonHDL: 131.34
Total CHOL/HDL Ratio: 4
Triglycerides: 241 mg/dL — ABNORMAL HIGH (ref 0.0–149.0)
VLDL: 48.2 mg/dL — ABNORMAL HIGH (ref 0.0–40.0)

## 2018-08-06 LAB — COMPREHENSIVE METABOLIC PANEL
ALT: 22 U/L (ref 0–35)
AST: 21 U/L (ref 0–37)
Albumin: 4.5 g/dL (ref 3.5–5.2)
Alkaline Phosphatase: 75 U/L (ref 39–117)
BUN: 12 mg/dL (ref 6–23)
CO2: 26 mEq/L (ref 19–32)
Calcium: 9.4 mg/dL (ref 8.4–10.5)
Chloride: 101 mEq/L (ref 96–112)
Creatinine, Ser: 0.78 mg/dL (ref 0.40–1.20)
GFR: 73.3 mL/min (ref >60.00)
Glucose, Bld: 112 mg/dL — ABNORMAL HIGH (ref 70–99)
Potassium: 3.6 mEq/L (ref 3.5–5.1)
Sodium: 140 mEq/L (ref 135–145)
Total Bilirubin: 0.6 mg/dL (ref 0.2–1.2)
Total Protein: 8.1 g/dL (ref 6.0–8.3)

## 2018-08-06 LAB — HEMOGLOBIN A1C: Hgb A1c MFr Bld: 5.7 % (ref 4.6–6.5)

## 2018-08-06 LAB — TSH: TSH: 2.08 u[IU]/mL (ref 0.35–4.50)

## 2018-08-06 LAB — LDL CHOLESTEROL, DIRECT: Direct LDL: 89 mg/dL

## 2018-08-08 LAB — PROTEIN ELECTROPHORESIS, SERUM
Albumin ELP: 4.3 g/dL (ref 3.8–4.8)
Alpha 1: 0.4 g/dL — ABNORMAL HIGH (ref 0.2–0.3)
Alpha 2: 0.9 g/dL (ref 0.5–0.9)
Beta 2: 0.6 g/dL — ABNORMAL HIGH (ref 0.2–0.5)
Beta Globulin: 0.5 g/dL (ref 0.4–0.6)
Gamma Globulin: 1.4 g/dL (ref 0.8–1.7)
Total Protein: 8 g/dL (ref 6.1–8.1)

## 2018-08-27 DIAGNOSIS — Z9181 History of falling: Secondary | ICD-10-CM | POA: Diagnosis not present

## 2018-08-27 DIAGNOSIS — R29898 Other symptoms and signs involving the musculoskeletal system: Secondary | ICD-10-CM | POA: Diagnosis not present

## 2018-09-04 ENCOUNTER — Other Ambulatory Visit: Payer: Self-pay

## 2018-09-04 NOTE — Patient Outreach (Signed)
[facility]) Care Management  09/04/2018  Stephanie Pham 10-30-1950 [id number]   Medication Adherence call to Stephanie Pham spoke with patient she is past due on Fosinopril/Hctz 20/12.5 mg and Simvastatin 20 mg patient explain she is taking 1 tablet daily and has plenty at this time patient explain she has been receiving ahead of time from [facility]  and ends up with extras. Stephanie Pham is showing past due under [place].   [place] Management Direct Dial [phone]  Fax [phone] [email or web address]

## 2018-09-25 ENCOUNTER — Other Ambulatory Visit: Payer: Self-pay | Admitting: Internal Medicine

## 2018-09-25 DIAGNOSIS — F411 Generalized anxiety disorder: Secondary | ICD-10-CM

## 2018-09-25 MED ORDER — CLORAZEPATE DIPOTASSIUM 7.5 MG PO TABS: 7.5000 mg | ORAL_TABLET | Freq: Four times a day (QID) | ORAL | 1 refills | Status: DC | PRN

## 2018-09-26 DIAGNOSIS — Z9181 History of falling: Secondary | ICD-10-CM | POA: Diagnosis not present

## 2018-09-26 DIAGNOSIS — R29898 Other symptoms and signs involving the musculoskeletal system: Secondary | ICD-10-CM | POA: Diagnosis not present

## 2018-11-26 NOTE — Progress Notes (Addendum)
Subjective:   Stephanie Pham is a 68 y.o. female who presents for Medicare Annual (Subsequent) preventive examination.  Review of Systems:   Cardiac Risk Factors include: advanced age (>65men, >13 women);dyslipidemia;obesity (BMI >30kg/m2);hypertension Sleep patterns: feels rested on waking, gets up 1-2 times nightly to void and sleeps 6-7 hours nightly.    Home Safety/Smoke Alarms: Feels safe in home. Smoke alarms in place.  Living environment; residence and Firearm Safety: [place], can live on one level. Lives with husband, no needs for DME, good support system Seat Belt Safety/Bike Helmet: Wears seat belt.      Objective:     Vitals: BP (!) 141/82   Pulse 61   Resp 17   Ht 5\' 4"  (1.626 m)   Wt 239 lb (108.4 kg)   SpO2 97%   BMI 41.02 kg/m   Body mass index is 41.02 kg/m.  Advanced Directives 11/27/2018 11/22/2017 11/21/2016 11/13/2016 10/30/2016 10/31/2015 12/28/2014  Does Patient Have a Medical Advance Directive? No No No No No No Yes  Type of Advance Directive - - - - - - Press photographer;Living will  Does patient want to make changes to medical advance directive? - Yes (ED - Information included in AVS) - - - - No - Patient declined  Copy of [place] in Chart? - - - - - - Yes  Would patient like information on creating a medical advance directive? No - Patient declined - Yes (ED - Information included in AVS) - - Yes - Educational materials given -    Tobacco Social History   Tobacco Use  Smoking Status Never Smoker  Smokeless Tobacco Never Used     Counseling given: Not Answered  Past Medical History:  Diagnosis Date  . Allergy   . Anemia   . Blood transfusion without reported diagnosis 1976  . Cancer ([place])    colon cancer 1976  . Cataract    both eyes-surgery  . Chronic anxiety   . Chronic depression   . Diverticulosis of colon    patient said it may have been diverticulitis on cipro for a long period  . Dyslipidemia    . Hemorrhoids   . Hyperlipidemia   . Hypertension   . Internal hemorrhoid   . Macular degeneration   . Osteoporosis    neck and hip osteopenia   Past Surgical History:  Procedure Laterality Date  . ABDOMINAL HYSTERECTOMY    . CATARACT EXTRACTION     left eye 05-13-2013  . CHOLECYSTECTOMY    . COLON SURGERY  2008  . COLONOSCOPY    . laparotomy and removal of polyp    . sigmoid colectomy     Family History  Problem Relation Age of Onset  . Fibromyalgia Mother   . Hypertension Mother   . Other Mother        "hallucinations" and fibromyalgia  . Ovarian cysts Mother        "growths"  . Heart disease Father   . Diabetes Father   . Heart attack Father 40       d. due to 2nd heart attack at 42  . Breast cancer Maternal Aunt        dx 70s; s/p mastectomy  . Other Maternal Aunt        hx of colon surgery in her 74s, lim info  . COPD Brother   . Heart attack Brother   . Kidney disease Brother  smoker; d. 16  . Bipolar disorder Brother   . Skin cancer Brother        described as a "black mole"  . Diabetes Brother   . Endometriosis Daughter   . Other Daughter        hx of breast biopsies  . Diabetes Paternal Aunt   . AAA (abdominal aortic aneurysm) Paternal Aunt   . Stroke Maternal Grandfather   . Other Maternal Grandfather        had an operation on his intestines to address constipation  . Stroke Paternal Aunt   . Stroke Paternal Aunt   . Diabetes Paternal Uncle   . Cancer Cousin        d. NOS cancer (maybe liver or lung cancer); +EtOH  . Diabetes Paternal Grandmother   . Other Paternal Grandmother        intestinal issues  . Stroke Paternal Grandfather   . Bipolar disorder Other   . Colon cancer Neg Hx   . Rectal cancer Neg Hx   . Stomach cancer Neg Hx   . Esophageal cancer Neg Hx   . Colon polyps Neg Hx    Social History   Socioeconomic History  . Marital status: Married    Spouse name: Not on file  . Number of children: 3  . Years of education:  Not on file  . Highest education level: Not on file  Occupational History  . Occupation: Retired  Scientific laboratory technician  . Financial resource strain: Not hard at all  . Food insecurity    Worry: Never true    Inability: Never true  . Transportation needs    Medical: No    Non-medical: No  Tobacco Use  . Smoking status: Never Smoker  . Smokeless tobacco: Never Used  Substance and Sexual Activity  . Alcohol use: No    Alcohol/week: 0.0 standard drinks  . Drug use: No  . Sexual activity: Not Currently  Lifestyle  . Physical activity    Days per week: 0 days    Minutes per session: 0 min  . Stress: Only a little  Relationships  . Social connections    Talks on phone: More than three times a week    Gets together: More than three times a week    Attends religious service: More than 4 times per year    Active member of club or organization: Yes    Attends meetings of clubs or organizations: More than 4 times per year    Relationship status: Married  Other Topics Concern  . Not on file  Social History Narrative   Married 1968   2 sons, 1 daughter lost one son in [place]   Full-time homemaker    Outpatient Encounter Medications as of 11/27/2018  Medication Sig  . acetaminophen (TYLENOL) 500 MG tablet Take 500 mg by mouth every 6 (six) hours as needed.  Marland Kitchen aspirin (BAYER LOW DOSE) 81 MG EC tablet   . atenolol (TENORMIN) 50 MG tablet TAKE 1 TABLET BY MOUTH  DAILY  . Cholecalciferol 2000 units TABS Take 1 tablet (2,000 Units total) by mouth daily.  . clorazepate (TRANXENE) 7.5 MG tablet Take 1 tablet (7.5 mg total) by mouth 4 (four) times daily as needed for anxiety.  . fosinopril-hydrochlorothiazide (MONOPRIL-HCT) 20-12.5 MG tablet TAKE 1 TABLET BY MOUTH  DAILY  . Krill Oil 300 MG CAPS Take 900 mg by mouth.   . Multiple Vitamins-Minerals (ICAPS AREDS 2 PO) Take 1 capsule by mouth 2 (  two) times daily.  . simvastatin (ZOCOR) 20 MG tablet TAKE 1 TABLET BY MOUTH  DAILY AT 6 PM. PLEASE KEEP   UPCOMING APPT IN DECEMBER  WITH DR. ROSS FOR FUTURE  REFILLS. THANK YOU  . [DISCONTINUED] Dietary Management Product (FOSTEUM PLUS) CAPS Take 1 capsule by mouth 2 (two) times daily. (Patient not taking: Reported on 11/27/2018)   No facility-administered encounter medications on file as of 11/27/2018.     Activities of Daily Living In your present state of health, do you have any difficulty performing the following activities: 11/27/2018  Hearing? N  Vision? N  Difficulty concentrating or making decisions? N  Walking or climbing stairs? N  Dressing or bathing? N  Doing errands, shopping? N  Preparing Food and eating ? N  Using the Toilet? N  In the past six months, have you accidently leaked urine? Y  Do you have problems with loss of bowel control? Y  Managing your Medications? N  Managing your Finances? N  Housekeeping or managing your Housekeeping? N  Some recent data might be hidden    Patient Care Team: [name], MD as PCP - General (Internal Medicine) Fay Records, MD as PCP - Cardiology (Cardiology) Druscilla Brownie, MD as Consulting Physician (Dermatology) Marygrace Drought, MD as Referring Physician (Student)    Assessment:   This is a routine wellness examination for Stephanie Pham. Physical assessment deferred to PCP.  Exercise Activities and Dietary recommendations Current Exercise Habits: The patient does not participate in regular exercise at present(discussed chair exercises and gave [facility] resource for senior TV program), Exercise limited by: None identified  Diet (meal preparation, eat out, water intake, caffeinated beverages, dairy products, fruits and vegetables): in general, a "healthy" diet     Reviewed heart healthy diet. Encouraged patient to increase daily water and healthy fluid intake. Discussed weight loss strategies.   Goals    . Patient Stated     Lose weight by decreasing the amount of junk food I eat. I plan to start to do chair and balance exercises.  Get up and do more around the house.    . Reduce fat intake to X grams per day     Keep trying to improve her diet;  Lowered fat overall         Fall Risk Fall Risk  11/27/2018 11/22/2017 11/21/2016 10/31/2015 12/28/2014  Falls in the past year? 1 Yes No No No  Number falls in past yr: 0 1 - - -  Injury with Fall? 0 No - - -  Risk for fall due to : Impaired balance/gait;Impaired mobility Impaired balance/gait;Impaired mobility - - -  Risk for fall due to: Comment - balance exericse print-out provided - - -  Follow up Falls prevention discussed Education provided;Falls prevention discussed - - -   Is the patient's home free of loose throw rugs in walkways, pet beds, electrical cords, etc?   yes      Grab bars in the bathroom? yes      Handrails on the stairs?   yes      Adequate lighting?   yes  Depression Screen PHQ 2/9 Scores 11/27/2018 11/22/2017 11/21/2016 10/31/2015  PHQ - 2 Score 2 1 1  0  PHQ- 9 Score 4 3 2  -     Cognitive Function MMSE - Mini Mental State Exam 10/31/2015  Not completed: (No Data)       Ad8 score reviewed for issues:  Issues making decisions: no  Less interest in  hobbies / activities: no  Repeats questions, stories (family complaining): no  Trouble using ordinary gadgets (microwave, computer, phone):no  Forgets the month or year: no  Mismanaging finances: no  Remembering appts: no  Daily problems with thinking and/or memory: no Ad8 score is= 0  Immunization History  Administered Date(s) Administered  . Fluad Quad(high Dose 65+) 11/27/2018  . Influenza, High Dose Seasonal PF 12/27/2015, 11/21/2016, 12/23/2017  . Influenza,inj,Quad PF,6+ Mos 02/09/2013, 11/17/2013, 12/22/2014  . Pneumococcal Conjugate-13 12/28/2014  . Pneumococcal Polysaccharide-23 12/27/2015  . Tetanus 02/09/2013   Screening Tests Health Maintenance  Topic Date Due  . MAMMOGRAM  12/03/2019  . COLONOSCOPY  11/15/2021  . TETANUS/TDAP  02/10/2023  . INFLUENZA VACCINE   Completed  . DEXA SCAN  Completed  . Hepatitis C Screening  Completed  . PNA vac Low Risk Adult  Completed      Plan:     Reviewed health maintenance screenings with patient today and relevant education, vaccines, and/or referrals were provided.   I have personally reviewed and noted the following in the patient's chart:   . Medical and social history . Use of alcohol, tobacco or illicit drugs  . Current medications and supplements . Functional ability and status . Nutritional status . Physical activity . Advanced directives . List of other physicians . Vitals . Screenings to include cognitive, depression, and falls . Referrals and appointments  In addition, I have reviewed and discussed with patient certain preventive protocols, quality metrics, and best practice recommendations. A written personalized care plan for preventive services as well as general preventive health recommendations were provided to patient.     Michiel Cowboy, RN  11/27/2018   Medical screening examination/treatment/procedure(s) were performed by non-physician practitioner and as supervising physician I was immediately available for consultation/collaboration. I agree with above. Scarlette Calico, MD

## 2018-11-27 ENCOUNTER — Ambulatory Visit: Payer: Medicare Other

## 2018-11-27 ENCOUNTER — Ambulatory Visit (INDEPENDENT_AMBULATORY_CARE_PROVIDER_SITE_OTHER): Payer: Medicare Other | Admitting: *Deleted

## 2018-11-27 ENCOUNTER — Other Ambulatory Visit: Payer: Self-pay

## 2018-11-27 VITALS — BP 141/82 | HR 61 | Resp 17 | Ht 64.0 in | Wt 239.0 lb

## 2018-11-27 DIAGNOSIS — Z0000 Encounter for general adult medical examination without abnormal findings: Secondary | ICD-10-CM | POA: Diagnosis not present

## 2018-11-27 DIAGNOSIS — Z23 Encounter for immunization: Secondary | ICD-10-CM

## 2018-11-27 NOTE — Patient Instructions (Addendum)
If you cannot attend class in person, you can still exercise at home. Video taped versions of AHOY classes are shown on [employer] (GTN) at 8 am and 1 pm Mondays through Fridays. You can also purchase a copy of the AHOY DVD by calling [place] (GTN) [employer]. GTN is available on [employer] channel 13 with a digital cable box and on [employer] channel 31. GTN is also available on [employer], channel 99. To view GTN, go to channel 99, press OK, select [place], then select GTN to start the channel.  The [place] provides information and referral services to aging adults 65+ in [place]. If there are waiting lists for community services, or if services are not available, [facility] connects clients with [facility] volunteers close to them who provide services such as respite care, wheelchair ramp construction, friendly visits, and transportation assistance. [facility]'s Call Center fields more than 350 calls each month.  AAIRS*- and SHIIP*-certified Call Center Specialists are ready to lend a compassionate ear and seek resources Monday through Friday, 9:00 am - 5:00 pm. The Call Center has met the needs of aging adults in all of [place] 100 counties. No religious affiliation is required; the only eligibility criterion is that clients be 65+ or older. Contact [facility] for help. *[facility] Information and Referral Systems *Seniors' Health Insurance Information Program Need help? Call [phone] today!  Continue to eat heart healthy diet (full of fruits, vegetables, whole grains, lean protein, water--limit salt, fat, and sugar intake) and increase physical activity as tolerated.  Continue doing brain stimulating activities (puzzles, reading, adult coloring books, staying active) to keep memory sharp.    Stephanie Pham , Thank you for taking time to come for your Medicare Wellness Visit. I appreciate your ongoing commitment to your health  goals. Please review the following plan we discussed and let me know if I can assist you in the future.   These are the goals we discussed: Goals    . Patient Stated     Lose weight by decreasing the amount of junk food I eat. I plan to start to do chair and balance exercises. Get up and do more around the house.    . Reduce fat intake to X grams per day     Keep trying to improve her diet;  Lowered fat overall         This is a list of the screening recommended for you and due dates:  Health Maintenance  Topic Date Due  . Mammogram  12/03/2019  . Colon Cancer Screening  11/15/2021  . Tetanus Vaccine  02/10/2023  . Flu Shot  Completed  . DEXA scan (bone density measurement)  Completed  .  Hepatitis C: One time screening is recommended by Center for Disease Control  (CDC) for  adults born from [birth year] through 1966.   Completed  . Pneumonia vaccines  Completed    Preventive Care 23 Years and Older, Female Preventive care refers to lifestyle choices and visits with your health care provider that can promote health and wellness. This includes:  A yearly physical exam. This is also called an annual well check.  Regular dental and eye exams.  Immunizations.  Screening for certain conditions.  Healthy lifestyle choices, such as diet and exercise. What can I expect for my preventive care visit? Physical exam Your health care provider will check:  Height and weight. These may be used to calculate body mass index (BMI), which is  a measurement that tells if you are at a healthy weight.  Heart rate and blood pressure.  Your skin for abnormal spots. Counseling Your health care provider may ask you questions about:  Alcohol, tobacco, and drug use.  Emotional well-being.  Home and relationship well-being.  Sexual activity.  Eating habits.  History of falls.  Memory and ability to understand (cognition).  Work and work Statistician.  Pregnancy and menstrual  history. What immunizations do I need?  Influenza (flu) vaccine  This is recommended every year. Tetanus, diphtheria, and pertussis (Tdap) vaccine  You may need a Td booster every 10 years. Varicella (chickenpox) vaccine  You may need this vaccine if you have not already been vaccinated. Zoster (shingles) vaccine  You may need this after age 49. Pneumococcal conjugate (PCV13) vaccine  One dose is recommended after age 59. Pneumococcal polysaccharide (PPSV23) vaccine  One dose is recommended after age 44. Measles, mumps, and rubella (MMR) vaccine  You may need at least one dose of MMR if you were born in 1957 or later. You may also need a second dose. Meningococcal conjugate (MenACWY) vaccine  You may need this if you have certain conditions. Hepatitis A vaccine  You may need this if you have certain conditions or if you travel or work in places where you may be exposed to hepatitis A. Hepatitis B vaccine  You may need this if you have certain conditions or if you travel or work in places where you may be exposed to hepatitis B. Haemophilus influenzae type b (Hib) vaccine  You may need this if you have certain conditions. You may receive vaccines as individual doses or as more than one vaccine together in one shot (combination vaccines). Talk with your health care provider about the risks and benefits of combination vaccines. What tests do I need? Blood tests  Lipid and cholesterol levels. These may be checked every 5 years, or more frequently depending on your overall health.  Hepatitis C test.  Hepatitis B test. Screening  Lung cancer screening. You may have this screening every year starting at age 10 if you have a 30-pack-year history of smoking and currently smoke or have quit within the past 15 years.  Colorectal cancer screening. All adults should have this screening starting at age 24 and continuing until age 57. Your health care provider may recommend  screening at age 69 if you are at increased risk. You will have tests every 1-10 years, depending on your results and the type of screening test.  Diabetes screening. This is done by checking your blood sugar (glucose) after you have not eaten for a while (fasting). You may have this done every 1-3 years.  Mammogram. This may be done every 1-2 years. Talk with your health care provider about how often you should have regular mammograms.  BRCA-related cancer screening. This may be done if you have a family history of breast, ovarian, tubal, or peritoneal cancers. Other tests  Sexually transmitted disease (STD) testing.  Bone density scan. This is done to screen for osteoporosis. You may have this done starting at age 55. Follow these instructions at home: Eating and drinking  Eat a diet that includes fresh fruits and vegetables, whole grains, lean protein, and low-fat dairy products. Limit your intake of foods with high amounts of sugar, saturated fats, and salt.  Take vitamin and mineral supplements as recommended by your health care provider.  Do not drink alcohol if your health care provider tells you not to  drink.  If you drink alcohol: ? Limit how much you have to 0-1 drink a day. ? Be aware of how much alcohol is in your drink. In the [place] one drink equals one 12 oz bottle of beer (355 mL), one 5 oz glass of wine (148 mL), or one 1 oz glass of hard liquor (44 mL). Lifestyle  Take daily care of your teeth and gums.  Stay active. Exercise for at least 30 minutes on 5 or more days each week.  Do not use any products that contain nicotine or tobacco, such as cigarettes, e-cigarettes, and chewing tobacco. If you need help quitting, ask your health care provider.  If you are sexually active, practice safe sex. Use a condom or other form of protection in order to prevent STIs (sexually transmitted infections).  Talk with your health care provider about taking a low-dose aspirin or  statin. What's next?  Go to your health care provider once a year for a well check visit.  Ask your health care provider how often you should have your eyes and teeth checked.  Stay up to date on all vaccines. This information is not intended to replace advice given to you by your health care provider. Make sure you discuss any questions you have with your health care provider. Document Released: 04/01/2015 Document Revised: 02/27/2018 Document Reviewed: 02/27/2018 Elsevier Patient Education  2020 [employer].

## 2018-12-04 DIAGNOSIS — Z1231 Encounter for screening mammogram for malignant neoplasm of breast: Secondary | ICD-10-CM | POA: Diagnosis not present

## 2019-01-14 DIAGNOSIS — H5213 Myopia, bilateral: Secondary | ICD-10-CM | POA: Diagnosis not present

## 2019-01-14 DIAGNOSIS — H524 Presbyopia: Secondary | ICD-10-CM | POA: Diagnosis not present

## 2019-01-14 DIAGNOSIS — H26492 Other secondary cataract, left eye: Secondary | ICD-10-CM | POA: Diagnosis not present

## 2019-01-14 DIAGNOSIS — H353132 Nonexudative age-related macular degeneration, bilateral, intermediate dry stage: Secondary | ICD-10-CM | POA: Diagnosis not present

## 2019-01-29 ENCOUNTER — Other Ambulatory Visit: Payer: Self-pay | Admitting: Internal Medicine

## 2019-01-29 DIAGNOSIS — I10 Essential (primary) hypertension: Secondary | ICD-10-CM

## 2019-03-02 ENCOUNTER — Other Ambulatory Visit: Payer: Self-pay | Admitting: Internal Medicine

## 2019-03-02 DIAGNOSIS — F411 Generalized anxiety disorder: Secondary | ICD-10-CM

## 2019-03-02 NOTE — Telephone Encounter (Signed)
Medication Refill - Medication: clorazepate (TRANXENE) 7.5 MG tablet for 3 month supply  Has the patient contacted their pharmacy? Yes.   (Agent: If no, request that the patient contact the pharmacy for the refill.) (Agent: If yes, when and what did the pharmacy advise?)  Preferred Pharmacy (with phone number or street name): [facility], [place]  Agent: Please be advised that RX refills may take up to 3 business days. We ask that you follow-up with your pharmacy.

## 2019-03-02 NOTE — Telephone Encounter (Signed)
Requested medication (s) are due for refill today: yes  Requested medication (s) are on the active medication list: yes  Last refill: 09/25/2018  Future visit scheduled: yes  Notes to clinic:  Not delegated    Requested Prescriptions  Pending Prescriptions Disp Refills   clorazepate (TRANXENE) 7.5 MG tablet 360 tablet 1    Sig: Take 1 tablet (7.5 mg total) by mouth 4 (four) times daily as needed for anxiety.      Not Delegated - Psychiatry:  Anxiolytics/Hypnotics Failed - 03/02/2019  1:45 PM      Failed - This refill cannot be delegated      Failed - Urine Drug Screen completed in last 360 days.      Passed - Valid encounter within last 6 months    Recent Outpatient Visits           7 months ago Essential hypertension   [place], Thomas L, MD   1 year ago Essential hypertension   [place], Thomas L, MD   1 year ago Hyperglycemia   [place], Thomas L, MD   2 years ago Essential hypertension   [place], Thomas L, MD   3 years ago Essential hypertension   [place], MD       Future Appointments             In 1 month Harrington Challenger, Carmin Muskrat, MD [place], [facility]   In 9 months  [place], [place]

## 2019-03-03 ENCOUNTER — Other Ambulatory Visit: Payer: Self-pay | Admitting: Internal Medicine

## 2019-03-03 DIAGNOSIS — F411 Generalized anxiety disorder: Secondary | ICD-10-CM

## 2019-03-03 MED ORDER — CLORAZEPATE DIPOTASSIUM 7.5 MG PO TABS: 7.5000 mg | ORAL_TABLET | Freq: Four times a day (QID) | ORAL | 0 refills | Status: DC | PRN

## 2019-03-03 NOTE — Telephone Encounter (Signed)
Appointment scheduled.

## 2019-03-05 ENCOUNTER — Other Ambulatory Visit: Payer: Self-pay

## 2019-03-05 ENCOUNTER — Encounter: Payer: Self-pay | Admitting: Internal Medicine

## 2019-03-05 ENCOUNTER — Ambulatory Visit (INDEPENDENT_AMBULATORY_CARE_PROVIDER_SITE_OTHER): Payer: Medicare Other | Admitting: Internal Medicine

## 2019-03-05 VITALS — BP 142/84 | HR 66 | Temp 98.0°F | Resp 16 | Ht 64.0 in | Wt 244.0 lb

## 2019-03-05 DIAGNOSIS — R739 Hyperglycemia, unspecified: Secondary | ICD-10-CM

## 2019-03-05 DIAGNOSIS — E559 Vitamin D deficiency, unspecified: Secondary | ICD-10-CM

## 2019-03-05 DIAGNOSIS — I10 Essential (primary) hypertension: Secondary | ICD-10-CM

## 2019-03-05 DIAGNOSIS — K760 Fatty (change of) liver, not elsewhere classified: Secondary | ICD-10-CM

## 2019-03-05 DIAGNOSIS — E781 Pure hyperglyceridemia: Secondary | ICD-10-CM | POA: Diagnosis not present

## 2019-03-05 DIAGNOSIS — F411 Generalized anxiety disorder: Secondary | ICD-10-CM

## 2019-03-05 MED ORDER — CLORAZEPATE DIPOTASSIUM 7.5 MG PO TABS: 7.5000 mg | ORAL_TABLET | Freq: Four times a day (QID) | ORAL | 1 refills | Status: DC | PRN

## 2019-03-05 NOTE — Patient Instructions (Signed)

## 2019-03-05 NOTE — Progress Notes (Signed)
Subjective:  Patient ID: Stephanie Pham, female    DOB: [birth year]  Age: 68 y.o. MRN: [id number]  CC: Hypertension and Hyperlipidemia  This visit occurred during the SARS-CoV-2 public health emergency.  Safety protocols were in place, including screening questions prior to the visit, additional usage of staff PPE, and extensive cleaning of exam room while observing appropriate contact time as indicated for disinfecting solutions.   HPI [name] presents for f/up - She continues to complain of anxiety and tremors and wants a refill of clorazepate.  She complains of weight gain but otherwise feels well and offers no other complaints.  She is not very active but when she does move around she does not experience any chest pain, shortness of breath, palpitations, edema, or fatigue.  Outpatient Medications Prior to Visit  Medication Sig Dispense Refill  . acetaminophen (TYLENOL) 500 MG tablet Take 500 mg by mouth every 6 (six) hours as needed.    Marland Kitchen aspirin (BAYER LOW DOSE) 81 MG EC tablet     . atenolol (TENORMIN) 50 MG tablet TAKE 1 TABLET BY MOUTH  DAILY 90 tablet 1  . Cholecalciferol 2000 units TABS Take 1 tablet (2,000 Units total) by mouth daily. 90 tablet 3  . fosinopril-hydrochlorothiazide (MONOPRIL-HCT) 20-12.5 MG tablet TAKE 1 TABLET BY MOUTH  DAILY 90 tablet 1  . Krill Oil 300 MG CAPS Take 900 mg by mouth.     . Multiple Vitamins-Minerals (ICAPS AREDS 2 PO) Take 1 capsule by mouth 2 (two) times daily.    . simvastatin (ZOCOR) 20 MG tablet TAKE 1 TABLET BY MOUTH  DAILY AT 6 PM. PLEASE KEEP  UPCOMING APPT IN DECEMBER  WITH DR. ROSS FOR FUTURE  REFILLS. THANK YOU 90 tablet 2  . clorazepate (TRANXENE) 7.5 MG tablet Take 1 tablet (7.5 mg total) by mouth 4 (four) times daily as needed for anxiety. 120 tablet 0   No facility-administered medications prior to visit.    ROS Review of Systems  Constitutional: Positive for unexpected weight change. Negative for diaphoresis and  fatigue.  HENT: Negative.   Eyes: Negative.   Respiratory: Negative for cough, chest tightness, shortness of breath and wheezing.   Cardiovascular: Negative for chest pain, palpitations and leg swelling.  Gastrointestinal: Negative for abdominal pain, constipation, diarrhea, nausea and vomiting.  Endocrine: Negative.  Negative for cold intolerance and heat intolerance.  Genitourinary: Negative.  Negative for difficulty urinating.  Musculoskeletal: Negative.  Negative for arthralgias and myalgias.  Skin: Negative.  Negative for color change and pallor.  Neurological: Positive for tremors. Negative for dizziness, light-headedness and numbness.  Hematological: Negative for adenopathy. Does not bruise/bleed easily.  Psychiatric/Behavioral: Negative for decreased concentration, dysphoric mood, self-injury, sleep disturbance and suicidal ideas. The patient is nervous/anxious.     Objective:  BP (!) 142/84 (BP Location: Left Arm, Patient Position: Sitting, Cuff Size: Large)   Pulse 66   Temp 98 F (36.7 C) (Oral)   Resp 16   Ht 5\' 4"  (1.626 m)   Wt 244 lb (110.7 kg)   SpO2 96%   BMI 41.88 kg/m   BP Readings from Last 3 Encounters:  03/05/19 (!) 142/84  11/27/18 (!) 141/82  02/28/18 138/80    Wt Readings from Last 3 Encounters:  03/05/19 244 lb (110.7 kg)  11/27/18 239 lb (108.4 kg)  02/28/18 248 lb 3.2 oz (112.6 kg)    Physical Exam Vitals reviewed.  Constitutional:      Appearance: Normal appearance. She is obese.  HENT:     Nose: Nose normal.     Mouth/Throat:     Mouth: Mucous membranes are moist.  Eyes:     General: No scleral icterus.    Conjunctiva/sclera: Conjunctivae normal.  Cardiovascular:     Rate and Rhythm: Normal rate and regular rhythm.     Heart sounds: No murmur.  Pulmonary:     Effort: Pulmonary effort is normal.     Breath sounds: No wheezing, rhonchi or rales.  Abdominal:     General: Abdomen is protuberant. Bowel sounds are normal. There is no  distension.     Palpations: Abdomen is soft. There is no hepatomegaly or splenomegaly.     Tenderness: There is no abdominal tenderness.  Musculoskeletal:        General: Normal range of motion.     Cervical back: Neck supple.     Right lower leg: No edema.     Left lower leg: No edema.  Lymphadenopathy:     Cervical: No cervical adenopathy.  Skin:    General: Skin is warm and dry.  Neurological:     General: No focal deficit present.     Mental Status: She is alert.     Motor: Tremor present. No weakness or atrophy.     Coordination: Coordination is intact.  Psychiatric:        Attention and Perception: Attention and perception normal.        Mood and Affect: Mood is anxious.        Speech: Speech normal. Speech is not rapid and pressured or delayed.        Behavior: Behavior normal. Behavior is cooperative.        Thought Content: Thought content normal. Thought content is not paranoid or delusional. Thought content does not include homicidal or suicidal ideation.        Judgment: Judgment normal.     Lab Results  Component Value Date   WBC 5.1 08/06/2018   HGB 13.3 08/06/2018   HCT 38.5 08/06/2018   PLT 221.0 08/06/2018   GLUCOSE 112 (H) 08/06/2018   CHOL 183 08/06/2018   TRIG 241.0 (H) 08/06/2018   HDL 52.10 08/06/2018   LDLDIRECT 89.0 08/06/2018   ALT 22 08/06/2018   AST 21 08/06/2018   NA 140 08/06/2018   K 3.6 08/06/2018   CL 101 08/06/2018   CREATININE 0.78 08/06/2018   BUN 12 08/06/2018   CO2 26 08/06/2018   TSH 2.08 08/06/2018   HGBA1C 5.7 08/06/2018    No results found.  Assessment & Plan:   Azhar was seen today for hypertension and hyperlipidemia.  Diagnoses and all orders for this visit:  Essential hypertension- Her blood pressure is adequately well controlled.  I will monitor her electrolytes and renal function. -     CBC with Differential; Future -     Basic metabolic panel; Future  Fatty liver disease, nonalcoholic- I will monitor her  LFTs.  She agrees to improve her lifestyle modifications. -     Hepatic function panel; Future  Vitamin D deficiency- I will monitor her vitamin D level and will treat if indicated. -     Vitamin D 25 hydroxy; Future  Hypertriglyceridemia- I will monitor her triglycerides and will treat if indicated. -     Lipid panel; Future  Hyperglycemia- I will check her A1c to see if she has developed DM2. -     Hemoglobin A1c; Future  GAD (generalized anxiety disorder) -  clorazepate (TRANXENE) 7.5 MG tablet; Take 1 tablet (7.5 mg total) by mouth 4 (four) times daily as needed for anxiety.   I am having Judieth Keens maintain her Cholecalciferol, Krill Oil, acetaminophen, aspirin, Multiple Vitamins-Minerals (ICAPS AREDS 2 PO), simvastatin, atenolol, fosinopril-hydrochlorothiazide, and clorazepate.  Meds ordered this encounter  Medications  . clorazepate (TRANXENE) 7.5 MG tablet    Sig: Take 1 tablet (7.5 mg total) by mouth 4 (four) times daily as needed for anxiety.    Dispense:  360 tablet    Refill:  1     Follow-up: Return in about 6 months (around 09/03/2019).  Scarlette Calico, MD

## 2019-03-16 ENCOUNTER — Other Ambulatory Visit: Payer: Self-pay | Admitting: Internal Medicine

## 2019-03-16 DIAGNOSIS — I10 Essential (primary) hypertension: Secondary | ICD-10-CM

## 2019-04-05 NOTE — Progress Notes (Signed)
Cardiology Office Note   Date:  04/06/2019   ID:  Stephanie Pham, DOB 12-06-1950, MRN [id number]  PCP:  Stephanie Lima, MD  Cardiologist:   Stephanie Carnes, MD    Pt presents for f/u of HTN and SOB   History of Present Illness: Stephanie Pham is a 69 y.o. female with a history of HTN and dyspea I saw her in December 2019  Patient says her breathing is okay.  She denies chest pains.  Blood pressure is better at home.  She gets anxious at the doctor's office.   Outpatient Medications Prior to Visit  Medication Sig Dispense Refill  . acetaminophen (TYLENOL) 500 MG tablet Take 500 mg by mouth every 6 (six) hours as needed.    Marland Kitchen aspirin (BAYER LOW DOSE) 81 MG EC tablet     . atenolol (TENORMIN) 50 MG tablet TAKE 1 TABLET BY MOUTH  DAILY 90 tablet 1  . Cholecalciferol 2000 units TABS Take 1 tablet (2,000 Units total) by mouth daily. 90 tablet 3  . clorazepate (TRANXENE) 7.5 MG tablet Take 1 tablet (7.5 mg total) by mouth 4 (four) times daily as needed for anxiety. 360 tablet 1  . fosinopril-hydrochlorothiazide (MONOPRIL-HCT) 20-12.5 MG tablet TAKE 1 TABLET BY MOUTH  DAILY 90 tablet 1  . Krill Oil 300 MG CAPS Take 900 mg by mouth.     . Multiple Vitamins-Minerals (ICAPS AREDS 2 PO) Take 1 capsule by mouth 2 (two) times daily.    . simvastatin (ZOCOR) 20 MG tablet TAKE 1 TABLET BY MOUTH  DAILY AT 6 PM. PLEASE KEEP  UPCOMING APPT IN DECEMBER  WITH DR. Kavion Pham FOR FUTURE  REFILLS. THANK YOU 90 tablet 2   No facility-administered medications prior to visit.     Allergies:   Codeine, Iohexol, Ivp dye [iodinated diagnostic agents], Minocycline, and Sulfonamide derivatives   Past Medical History:  Diagnosis Date  . Allergy   . Anemia   . Blood transfusion without reported diagnosis 1976  . Cancer ([place])    colon cancer 1976  . Cataract    both eyes-surgery  . Chronic anxiety   . Chronic depression   . Diverticulosis of colon    patient said it may have been diverticulitis on cipro  for a long period  . Dyslipidemia   . Hemorrhoids   . Hyperlipidemia   . Hypertension   . Internal hemorrhoid   . Macular degeneration   . Osteoporosis    neck and hip osteopenia    Past Surgical History:  Procedure Laterality Date  . ABDOMINAL HYSTERECTOMY    . CATARACT EXTRACTION     left eye 05-13-2013  . CHOLECYSTECTOMY    . COLON SURGERY  2008  . COLONOSCOPY    . laparotomy and removal of polyp    . sigmoid colectomy       Social History:  The patient  reports that she has never smoked. She has never used smokeless tobacco. She reports that she does not drink alcohol or use drugs.   Family History:  The patient's family history includes AAA (abdominal aortic aneurysm) in her paternal aunt; Bipolar disorder in her brother and another family member; Breast cancer in her maternal aunt; COPD in her brother; Cancer in her cousin; Diabetes in her brother, father, paternal aunt, paternal grandmother, and paternal uncle; Endometriosis in her daughter; Fibromyalgia in her mother; Heart attack in her brother; Heart attack (age of onset: 36) in her father; Heart disease in her father;  Hypertension in her mother; Kidney disease in her brother; Other in her daughter, maternal aunt, maternal grandfather, mother, and paternal grandmother; Ovarian cysts in her mother; Skin cancer in her brother; Stroke in her maternal grandfather, paternal aunt, paternal aunt, and paternal grandfather.    ROS:  Please see the history of present illness. All other systems are reviewed and  Negative to the above problem except as noted.    PHYSICAL EXAM: VS:  BP 124/70   Pulse 70   Ht 5\' 4"  (1.626 m)   Wt 238 lb 9.6 oz (108.2 kg)   BMI 40.96 kg/m   GEN: Morbidly obese 66  , in no acute distress  HEENT: normal  Neck: Neck is full   No carotid bruits   Cardiac: RRR; no murmurs, rubs, or gallops,Tr LE  dema  Respiratory:  clear to auscultation bilaterally, normal work of breathing GI: soft, nontender,  nondistended, + BS  No hepatomegaly  MS: no deformity Moving all extremities   Skin: warm   Very dry scaling. Neuro:  Strength and sensation are intact Psych: euthymic mood, full affect   EKG:  EKG is ordered today    SR 70 bpm     Lipid Panel    Component Value Date/Time   CHOL 183 08/06/2018 0838   TRIG 241.0 (H) 08/06/2018 0838   HDL 52.10 08/06/2018 0838   CHOLHDL 4 08/06/2018 0838   VLDL 48.2 (H) 08/06/2018 0838   LDLDIRECT 89.0 08/06/2018 0838      Wt Readings from Last 3 Encounters:  04/06/19 238 lb 9.6 oz (108.2 kg)  03/05/19 244 lb (110.7 kg)  11/27/18 239 lb (108.4 kg)      ASSESSMENT AND PLAN:  1  Dyspnea:  Pt denies  significant dyspnea. Breathing is stable    2.  Hypertension: good control today.  Keep on current regimen  3.  Dyslipidemia.  Last LDL in May was 89.  HDL 52 triglycerides 241.  Watch carbs.  4  Morbid obesity  Counselled on diet     F/U based in the fall.   Current medicines are reviewed at length with the patient today.  The patient does not have concerns regarding medicines.  Signed, Stephanie Carnes, MD  04/06/2019 9:48 AM    [address], [place], [place]  [address] Phone: [phone]; Fax: [phone]

## 2019-04-06 ENCOUNTER — Ambulatory Visit: Payer: Medicare Other | Admitting: Internal Medicine

## 2019-04-06 ENCOUNTER — Encounter: Payer: Self-pay | Admitting: Internal Medicine

## 2019-04-06 ENCOUNTER — Other Ambulatory Visit: Payer: Self-pay

## 2019-04-06 VITALS — BP 124/70 | HR 70 | Ht 64.0 in | Wt 238.6 lb

## 2019-04-06 DIAGNOSIS — I10 Essential (primary) hypertension: Secondary | ICD-10-CM

## 2019-04-06 DIAGNOSIS — E785 Hyperlipidemia, unspecified: Secondary | ICD-10-CM

## 2019-04-06 NOTE — Patient Instructions (Signed)
Medication Instructions:  No changes *If you need a refill on your cardiac medications before your next appointment, please call your pharmacy*  Lab Work: none If you have labs (blood work) drawn today and your tests are completely normal, you will receive your results only by: Marland Kitchen MyChart Message (if you have MyChart) OR . A paper copy in the mail If you have any lab test that is abnormal or we need to change your treatment, we will call you to review the results.  Testing/Procedures: none  Follow-Up: At [facility], you and your health needs are our priority.  As part of our continuing mission to provide you with exceptional heart care, we have created designated Provider Care Teams.  These Care Teams include your primary Cardiologist (physician) and Advanced Practice Providers (APPs -  Physician Assistants and Nurse Practitioners) who all work together to provide you with the care you need, when you need it.  Your next appointment:   10 month(s) (November)  The format for your next appointment:   Either In Person or Virtual  Provider:   You may see Dorris Carnes, MD or one of the following Advanced Practice Providers on your designated Care Team:    Richardson Dopp, PA-C  [place], [place]  Daune Perch, NP   Other Instructions

## 2019-05-17 ENCOUNTER — Other Ambulatory Visit: Payer: Self-pay | Admitting: Internal Medicine

## 2019-08-09 ENCOUNTER — Other Ambulatory Visit: Payer: Self-pay | Admitting: Internal Medicine

## 2019-08-09 DIAGNOSIS — I10 Essential (primary) hypertension: Secondary | ICD-10-CM

## 2019-08-25 ENCOUNTER — Other Ambulatory Visit: Payer: Self-pay | Admitting: Internal Medicine

## 2019-08-25 DIAGNOSIS — F411 Generalized anxiety disorder: Secondary | ICD-10-CM

## 2019-10-26 ENCOUNTER — Other Ambulatory Visit: Payer: Self-pay | Admitting: Internal Medicine

## 2019-10-26 DIAGNOSIS — I10 Essential (primary) hypertension: Secondary | ICD-10-CM

## 2019-11-30 ENCOUNTER — Telehealth: Payer: Self-pay | Admitting: Internal Medicine

## 2019-11-30 DIAGNOSIS — F411 Generalized anxiety disorder: Secondary | ICD-10-CM

## 2019-11-30 DIAGNOSIS — I10 Essential (primary) hypertension: Secondary | ICD-10-CM

## 2019-12-03 LAB — HM DEXA SCAN: HM Dexa Scan: -1.2

## 2019-12-04 NOTE — Telephone Encounter (Signed)
Patient has made an appointment for 10/6. Requesting refills be sent in.

## 2019-12-05 ENCOUNTER — Other Ambulatory Visit: Payer: Self-pay | Admitting: Internal Medicine

## 2019-12-05 DIAGNOSIS — I10 Essential (primary) hypertension: Secondary | ICD-10-CM

## 2019-12-05 DIAGNOSIS — F411 Generalized anxiety disorder: Secondary | ICD-10-CM

## 2019-12-05 MED ORDER — FOSINOPRIL SODIUM-HCTZ 20-12.5 MG PO TABS
1.0000 | ORAL_TABLET | Freq: Every day | ORAL | 0 refills | Status: DC
Start: 2019-12-05 — End: 2020-02-05

## 2019-12-05 MED ORDER — CLORAZEPATE DIPOTASSIUM 7.5 MG PO TABS: ORAL_TABLET | ORAL | 0 refills | Status: DC

## 2019-12-05 MED ORDER — ATENOLOL 50 MG PO TABS
50.0000 mg | ORAL_TABLET | Freq: Every day | ORAL | 0 refills | Status: DC
Start: 2019-12-05 — End: 2020-02-05

## 2019-12-07 ENCOUNTER — Encounter: Payer: Self-pay | Admitting: Internal Medicine

## 2019-12-07 DIAGNOSIS — M85851 Other specified disorders of bone density and structure, right thigh: Secondary | ICD-10-CM | POA: Diagnosis not present

## 2019-12-07 DIAGNOSIS — Z1231 Encounter for screening mammogram for malignant neoplasm of breast: Secondary | ICD-10-CM | POA: Diagnosis not present

## 2019-12-07 LAB — HM MAMMOGRAPHY

## 2019-12-08 ENCOUNTER — Ambulatory Visit (INDEPENDENT_AMBULATORY_CARE_PROVIDER_SITE_OTHER): Payer: Medicare Other

## 2019-12-08 ENCOUNTER — Other Ambulatory Visit: Payer: Self-pay

## 2019-12-08 ENCOUNTER — Encounter: Payer: Self-pay | Admitting: Internal Medicine

## 2019-12-08 VITALS — BP 130/76 | HR 74 | Temp 98.0°F | Resp 16 | Ht 64.0 in | Wt 239.2 lb

## 2019-12-08 DIAGNOSIS — Z23 Encounter for immunization: Secondary | ICD-10-CM | POA: Diagnosis not present

## 2019-12-08 DIAGNOSIS — Z0000 Encounter for general adult medical examination without abnormal findings: Secondary | ICD-10-CM

## 2019-12-08 NOTE — Patient Instructions (Signed)
Stephanie Pham , Thank you for taking time to come for your Medicare Wellness Visit. I appreciate your ongoing commitment to your health goals. Please review the following plan we discussed and let me know if I can assist you in the future.   Screening recommendations/referrals: Colonoscopy: 11/15/2016; due every 5 years Mammogram: 12/04/2018; due every year Bone Density: 12/02/2017; due every 2 years Recommended yearly ophthalmology/optometry visit for glaucoma screening and checkup Recommended yearly dental visit for hygiene and checkup  Vaccinations: Influenza vaccine: 12/08/2019 Pneumococcal vaccine: up to date Tdap vaccine: never done Shingles vaccine: never done   Covid-19: never done  Advanced directives: Advance directive discussed with you today. Even though you declined this today please call our office should you change your mind and we can give you the proper paperwork for you to fill out.  Conditions/risks identified: Yes; Reviewed health maintenance screenings with patient today and relevant education, vaccines, and/or referrals were provided. Please continue to do your personal lifestyle choices by: daily care of teeth and gums, regular physical activity (goal should be 5 days a week for 30 minutes), eat a healthy diet, avoid tobacco and drug use, limiting any alcohol intake, taking a low-dose aspirin (if not allergic or have been advised by your provider otherwise) and taking vitamins and minerals as recommended by your provider. Continue doing brain stimulating activities (puzzles, reading, adult coloring books, staying active) to keep memory sharp. Continue to eat heart healthy diet (full of fruits, vegetables, whole grains, lean protein, water--limit salt, fat, and sugar intake) and increase physical activity as tolerated.  Next appointment: Please schedule your next Medicare Wellness Visit with your Nurse Health Advisor in 1 year.  Preventive Care 5 Years and Older,  Female Preventive care refers to lifestyle choices and visits with your health care provider that can promote health and wellness. What does preventive care include?  A yearly physical exam. This is also called an annual well check.  Dental exams once or twice a year.  Routine eye exams. Ask your health care provider how often you should have your eyes checked.  Personal lifestyle choices, including:  Daily care of your teeth and gums.  Regular physical activity.  Eating a healthy diet.  Avoiding tobacco and drug use.  Limiting alcohol use.  Practicing safe sex.  Taking low-dose aspirin every day.  Taking vitamin and mineral supplements as recommended by your health care provider. What happens during an annual well check? The services and screenings done by your health care provider during your annual well check will depend on your age, overall health, lifestyle risk factors, and family history of disease. Counseling  Your health care provider may ask you questions about your:  Alcohol use.  Tobacco use.  Drug use.  Emotional well-being.  Home and relationship well-being.  Sexual activity.  Eating habits.  History of falls.  Memory and ability to understand (cognition).  Work and work Statistician.  Reproductive health. Screening  You may have the following tests or measurements:  Height, weight, and BMI.  Blood pressure.  Lipid and cholesterol levels. These may be checked every 5 years, or more frequently if you are over 72 years old.  Skin check.  Lung cancer screening. You may have this screening every year starting at age 68 if you have a 30-pack-year history of smoking and currently smoke or have quit within the past 15 years.  Fecal occult blood test (FOBT) of the stool. You may have this test every year starting at age  50.  Flexible sigmoidoscopy or colonoscopy. You may have a sigmoidoscopy every 5 years or a colonoscopy every 10 years  starting at age 34.  Hepatitis C blood test.  Hepatitis B blood test.  Sexually transmitted disease (STD) testing.  Diabetes screening. This is done by checking your blood sugar (glucose) after you have not eaten for a while (fasting). You may have this done every 1-3 years.  Bone density scan. This is done to screen for osteoporosis. You may have this done starting at age 93.  Mammogram. This may be done every 1-2 years. Talk to your health care provider about how often you should have regular mammograms. Talk with your health care provider about your test results, treatment options, and if necessary, the need for more tests. Vaccines  Your health care provider may recommend certain vaccines, such as:  Influenza vaccine. This is recommended every year.  Tetanus, diphtheria, and acellular pertussis (Tdap, Td) vaccine. You may need a Td booster every 10 years.  Zoster vaccine. You may need this after age 69.  Pneumococcal 13-valent conjugate (PCV13) vaccine. One dose is recommended after age 37.  Pneumococcal polysaccharide (PPSV23) vaccine. One dose is recommended after age 22. Talk to your health care provider about which screenings and vaccines you need and how often you need them. This information is not intended to replace advice given to you by your health care provider. Make sure you discuss any questions you have with your health care provider. Document Released: 04/01/2015 Document Revised: 11/23/2015 Document Reviewed: 01/04/2015 Elsevier Interactive Patient Education  2017 [employer] Prevention in the Home Falls can cause injuries. They can happen to people of all ages. There are many things you can do to make your home safe and to help prevent falls. What can I do on the outside of my home?  Regularly fix the edges of walkways and driveways and fix any cracks.  Remove anything that might make you trip as you walk through a door, such as a raised step or  threshold.  Trim any bushes or trees on the path to your home.  Use bright outdoor lighting.  Clear any walking paths of anything that might make someone trip, such as rocks or tools.  Regularly check to see if handrails are loose or broken. Make sure that both sides of any steps have handrails.  Any raised decks and porches should have guardrails on the edges.  Have any leaves, snow, or ice cleared regularly.  Use sand or salt on walking paths during winter.  Clean up any spills in your garage right away. This includes oil or grease spills. What can I do in the bathroom?  Use night lights.  Install grab bars by the toilet and in the tub and shower. Do not use towel bars as grab bars.  Use non-skid mats or decals in the tub or shower.  If you need to sit down in the shower, use a plastic, non-slip stool.  Keep the floor dry. Clean up any water that spills on the floor as soon as it happens.  Remove soap buildup in the tub or shower regularly.  Attach bath mats securely with double-sided non-slip rug tape.  Do not have throw rugs and other things on the floor that can make you trip. What can I do in the bedroom?  Use night lights.  Make sure that you have a light by your bed that is easy to reach.  Do not use any sheets  or blankets that are too big for your bed. They should not hang down onto the floor.  Have a firm chair that has side arms. You can use this for support while you get dressed.  Do not have throw rugs and other things on the floor that can make you trip. What can I do in the kitchen?  Clean up any spills right away.  Avoid walking on wet floors.  Keep items that you use a lot in easy-to-reach places.  If you need to reach something above you, use a strong step stool that has a grab bar.  Keep electrical cords out of the way.  Do not use floor polish or wax that makes floors slippery. If you must use wax, use non-skid floor wax.  Do not have  throw rugs and other things on the floor that can make you trip. What can I do with my stairs?  Do not leave any items on the stairs.  Make sure that there are handrails on both sides of the stairs and use them. Fix handrails that are broken or loose. Make sure that handrails are as long as the stairways.  Check any carpeting to make sure that it is firmly attached to the stairs. Fix any carpet that is loose or worn.  Avoid having throw rugs at the top or bottom of the stairs. If you do have throw rugs, attach them to the floor with carpet tape.  Make sure that you have a light switch at the top of the stairs and the bottom of the stairs. If you do not have them, ask someone to add them for you. What else can I do to help prevent falls?  Wear shoes that:  Do not have high heels.  Have rubber bottoms.  Are comfortable and fit you well.  Are closed at the toe. Do not wear sandals.  If you use a stepladder:  Make sure that it is fully opened. Do not climb a closed stepladder.  Make sure that both sides of the stepladder are locked into place.  Ask someone to hold it for you, if possible.  Clearly mark and make sure that you can see:  Any grab bars or handrails.  First and last steps.  Where the edge of each step is.  Use tools that help you move around (mobility aids) if they are needed. These include:  Canes.  Walkers.  Scooters.  Crutches.  Turn on the lights when you go into a dark area. Replace any light bulbs as soon as they burn out.  Set up your furniture so you have a clear path. Avoid moving your furniture around.  If any of your floors are uneven, fix them.  If there are any pets around you, be aware of where they are.  Review your medicines with your doctor. Some medicines can make you feel dizzy. This can increase your chance of falling. Ask your doctor what other things that you can do to help prevent falls. This information is not intended to  replace advice given to you by your health care provider. Make sure you discuss any questions you have with your health care provider. Document Released: 12/30/2008 Document Revised: 08/11/2015 Document Reviewed: 04/09/2014 Elsevier Interactive Patient Education  2017 [employer].

## 2019-12-08 NOTE — Progress Notes (Signed)
Subjective:   Stephanie Pham is a 69 y.o. female who presents for Medicare Annual (Subsequent) preventive examination.  Review of Systems    No ROS. Medicare Wellness Visit. Additional risk factors are reflected in social history. Cardiac Risk Factors include: advanced age (>61men, >78 women);dyslipidemia;family history of premature cardiovascular disease;hypertension Sleep Patterns: No sleep issues, feels rested on waking and sleeps 8 hours nightly. Home Safety/Smoke Alarms: Feels safe in home; uses home alarm. Smoke alarms in place. Living environment: 2-story home; Lives with spouse; no needs for DME; good support system. Seat Belt Safety/Bike Helmet: Wears seat belt.    Objective:    Today's Vitals   12/08/19 0946  BP: 130/76  Pulse: 74  Resp: 16  Temp: 98 F (36.7 C)  SpO2: 94%  Weight: 239 lb 3.2 oz (108.5 kg)  Height: 5\' 4"  (1.626 m)  PainSc: 0-No pain   Body mass index is 41.06 kg/m.  Advanced Directives 12/08/2019 11/27/2018 11/22/2017 11/21/2016 11/13/2016 10/30/2016 10/31/2015  Does Patient Have a Medical Advance Directive? No No No No No No No  Type of Advance Directive - - - - - - -  Does patient want to make changes to medical advance directive? - - Yes (ED - Information included in AVS) - - - -  Copy of [place] in Chart? - - - - - - -  Would patient like information on creating a medical advance directive? No - Patient declined No - Patient declined - Yes (ED - Information included in AVS) - - Yes - Educational materials given    Current Medications (verified) Outpatient Encounter Medications as of 12/08/2019  Medication Sig  . acetaminophen (TYLENOL) 500 MG tablet Take 500 mg by mouth every 6 (six) hours as needed.  Marland Kitchen aspirin (BAYER LOW DOSE) 81 MG EC tablet   . atenolol (TENORMIN) 50 MG tablet Take 1 tablet (50 mg total) by mouth daily.  . Cholecalciferol 2000 units TABS Take 1 tablet (2,000 Units total) by mouth daily.  . clorazepate  (TRANXENE) 7.5 MG tablet TAKE 1 TABLET BY MOUTH 4  TIMES DAILY AS NEEDED FOR  ANXIETY  . fosinopril-hydrochlorothiazide (MONOPRIL-HCT) 20-12.5 MG tablet Take 1 tablet by mouth daily.  Javier Docker Oil 300 MG CAPS Take 900 mg by mouth.   . Multiple Vitamins-Minerals (ICAPS AREDS 2 PO) Take 1 capsule by mouth 2 (two) times daily.  . simvastatin (ZOCOR) 20 MG tablet TAKE 1 TABLET BY MOUTH  DAILY AT 6 PM   No facility-administered encounter medications on file as of 12/08/2019.    Allergies (verified) Codeine, Iohexol, Ivp dye [iodinated diagnostic agents], Minocycline, and Sulfonamide derivatives   History: Past Medical History:  Diagnosis Date  . Allergy   . Anemia   . Blood transfusion without reported diagnosis 1976  . Cancer ([place])    colon cancer 1976  . Cataract    both eyes-surgery  . Chronic anxiety   . Chronic depression   . Diverticulosis of colon    patient said it may have been diverticulitis on cipro for a long period  . Dyslipidemia   . Hemorrhoids   . Hyperlipidemia   . Hypertension   . Internal hemorrhoid   . Macular degeneration   . Osteoporosis    neck and hip osteopenia   Past Surgical History:  Procedure Laterality Date  . ABDOMINAL HYSTERECTOMY    . CATARACT EXTRACTION     left eye 05-13-2013  . CHOLECYSTECTOMY    . COLON  SURGERY  2008  . COLONOSCOPY    . laparotomy and removal of polyp    . sigmoid colectomy     Family History  Problem Relation Age of Onset  . Fibromyalgia Mother   . Hypertension Mother   . Other Mother        "hallucinations" and fibromyalgia  . Ovarian cysts Mother        "growths"  . Heart disease Father   . Diabetes Father   . Heart attack Father 40       d. due to 2nd heart attack at 73  . Breast cancer Maternal Aunt        dx 3s; s/p mastectomy  . Other Maternal Aunt        hx of colon surgery in her 22s, lim info  . COPD Brother   . Heart attack Brother   . Kidney disease Brother        smoker; d. 10  . Bipolar  disorder Brother   . Skin cancer Brother        described as a "black mole"  . Diabetes Brother   . Endometriosis Daughter   . Other Daughter        hx of breast biopsies  . Diabetes Paternal Aunt   . AAA (abdominal aortic aneurysm) Paternal Aunt   . Stroke Maternal Grandfather   . Other Maternal Grandfather        had an operation on his intestines to address constipation  . Stroke Paternal Aunt   . Stroke Paternal Aunt   . Diabetes Paternal Uncle   . Cancer Cousin        d. NOS cancer (maybe liver or lung cancer); +EtOH  . Diabetes Paternal Grandmother   . Other Paternal Grandmother        intestinal issues  . Stroke Paternal Grandfather   . Bipolar disorder Other   . Colon cancer Neg Hx   . Rectal cancer Neg Hx   . Stomach cancer Neg Hx   . Esophageal cancer Neg Hx   . Colon polyps Neg Hx    Social History   Socioeconomic History  . Marital status: Married    Spouse name: Not on file  . Number of children: 3  . Years of education: Not on file  . Highest education level: Not on file  Occupational History  . Occupation: Retired  Tobacco Use  . Smoking status: Never Smoker  . Smokeless tobacco: Never Used  Vaping Use  . Vaping Use: Never used  Substance and Sexual Activity  . Alcohol use: No    Alcohol/week: 0.0 standard drinks  . Drug use: No  . Sexual activity: Not Currently  Other Topics Concern  . Not on file  Social History Narrative   Married 1968   2 sons, 1 daughter lost one son in [place]   Full-time homemaker   Social Determinants of Health   Financial Resource Strain: Low Risk   . Difficulty of Paying Living Expenses: Not hard at all  Food Insecurity: No Food Insecurity  . Worried About Charity fundraiser in the Last Year: Never true  . Ran Out of Food in the Last Year: Never true  Transportation Needs: No Transportation Needs  . Lack of Transportation (Medical): No  . Lack of Transportation (Non-Medical): No  Physical Activity: Inactive  .  Days of Exercise per Week: 0 days  . Minutes of Exercise per Session: 0 min  Stress: No Stress Concern  Present  . Feeling of Stress : Not at all  Social Connections: Socially Integrated  . Frequency of Communication with Friends and Family: More than three times a week  . Frequency of Social Gatherings with Friends and Family: More than three times a week  . Attends Religious Services: More than 4 times per year  . Active Member of Clubs or Organizations: Yes  . Attends Archivist Meetings: More than 4 times per year  . Marital Status: Married    Tobacco Counseling Counseling given: Not Answered   Clinical Intake:  Pre-visit preparation completed: Yes  Pain : No/denies pain Pain Score: 0-No pain     BMI - recorded: 41.06 Nutritional Status: BMI > 30  Obese Nutritional Risks: None Diabetes: No  How often do you need to have someone help you when you read instructions, pamphlets, or other written materials from your doctor or pharmacy?: 1 - Never What is the last grade level you completed in school?: HSG  Diabetic? no  Interpreter Needed?: No  Information entered by :: Lisette Abu, LPN   Activities of Daily Living In your present state of health, do you have any difficulty performing the following activities: 12/08/2019  Hearing? N  Vision? N  Difficulty concentrating or making decisions? N  Walking or climbing stairs? N  Dressing or bathing? N  Doing errands, shopping? Y  Comment Patient does not drive; her husband takes her to office visits and pharmacy.  Preparing Food and eating ? N  Using the Toilet? N  In the past six months, have you accidently leaked urine? N  Do you have problems with loss of bowel control? N  Managing your Medications? N  Managing your Finances? N  Housekeeping or managing your Housekeeping? N  Some recent data might be hidden    Patient Care Team: [name], MD as PCP - General (Internal Medicine) Fay Records, MD as PCP - Cardiology (Cardiology) Druscilla Brownie, MD as Consulting Physician (Dermatology) Marygrace Drought, MD as Consulting Physician  Indicate any recent Medical Services you may have received from other than [facility] providers in the past year (date may be approximate).     Assessment:   This is a routine wellness examination for Andora.  Hearing/Vision screen No exam data present  Dietary issues and exercise activities discussed: Current Exercise Habits: Home exercise routine, Type of exercise: walking, Time (Minutes): 30, Frequency (Times/Week): 5, Weekly Exercise (Minutes/Week): 150, Intensity: Moderate, Exercise limited by: None identified  Goals    . Patient Stated     Lose weight by decreasing the amount of junk food I eat. I plan to start to do chair and balance exercises. Get up and do more around the house.    . Reduce fat intake to X grams per day     Keep trying to improve her diet;  Lowered fat overall        Depression Screen PHQ 2/9 Scores 12/08/2019 11/27/2018 11/22/2017 11/21/2016 10/31/2015 12/28/2014 12/22/2014  PHQ - 2 Score 0 2 1 1  0 0 2  PHQ- 9 Score - 4 3 2  - 0 5    Fall Risk Fall Risk  12/08/2019 11/27/2018 11/22/2017 11/21/2016 10/31/2015  Falls in the past year? 0 1 Yes No No  Number falls in past yr: 0 0 1 - -  Injury with Fall? 0 0 No - -  Risk for fall due to : No Fall Risks Impaired balance/gait;Impaired mobility Impaired balance/gait;Impaired mobility - -  Risk for fall due to: Comment - - balance exericse print-out provided - -  Follow up Falls evaluation completed;Education provided Falls prevention discussed Education provided;Falls prevention discussed - -    Any stairs in or around the home? Yes  If so, are there any without handrails? No  Home free of loose throw rugs in walkways, pet beds, electrical cords, etc? Yes  Adequate lighting in your home to reduce risk of falls? Yes   ASSISTIVE DEVICES UTILIZED TO PREVENT FALLS:  Life alert? No    Use of a cane, walker or w/c? No  Grab bars in the bathroom? Yes  Shower chair or bench in shower? Yes  Elevated toilet seat or a handicapped toilet? Yes   TIMED UP AND GO:  Was the test performed? No .  Length of time to ambulate 10 feet: 0 sec.   Gait slow and steady without use of assistive device  Cognitive Function: MMSE - Mini Mental State Exam 10/31/2015  Not completed: (No Data)     6CIT Screen 12/08/2019  What Year? 0 points  What month? 0 points  What time? 3 points  Count back from 20 0 points  Months in reverse 0 points  Repeat phrase 0 points  Total Score 3    Immunizations Immunization History  Administered Date(s) Administered  . Fluad Quad(high Dose 65+) 11/27/2018  . Influenza, High Dose Seasonal PF 12/27/2015, 11/21/2016, 12/23/2017  . Influenza,inj,Quad PF,6+ Mos 02/09/2013, 11/17/2013, 12/22/2014  . Influenza-Unspecified 12/08/2019  . Pneumococcal Conjugate-13 12/28/2014  . Pneumococcal Polysaccharide-23 12/27/2015  . Tetanus 02/09/2013    TDAP status: Due, Education has been provided regarding the importance of this vaccine. Advised may receive this vaccine at local pharmacy or Health Dept. Aware to provide a copy of the vaccination record if obtained from local pharmacy or Health Dept. Verbalized acceptance and understanding. Flu Vaccine status: Up to date Pneumococcal vaccine status: Up to date Covid-19 vaccine status: Declined, Education has been provided regarding the importance of this vaccine but patient still declined. Advised may receive this vaccine at local pharmacy or Health Dept.or vaccine clinic. Aware to provide a copy of the vaccination record if obtained from local pharmacy or Health Dept. Verbalized acceptance and understanding.  Qualifies for Shingles Vaccine? Yes   Zostavax completed No   Shingrix Completed?: No.    Education has been provided regarding the importance of this vaccine. Patient has been advised to call insurance  company to determine out of pocket expense if they have not yet received this vaccine. Advised may also receive vaccine at local pharmacy or Health Dept. Verbalized acceptance and understanding.  Screening Tests Health Maintenance  Topic Date Due  . MAMMOGRAM  12/03/2019  . COVID-19 Vaccine (1) 12/24/2019 (Originally 12/10/1962)  . COLONOSCOPY  11/15/2021  . TETANUS/TDAP  02/10/2023  . INFLUENZA VACCINE  Completed  . DEXA SCAN  Completed  . Hepatitis C Screening  Completed  . PNA vac Low Risk Adult  Completed    Health Maintenance  Health Maintenance Due  Topic Date Due  . MAMMOGRAM  12/03/2019    Colorectal cancer screening: Completed 11/15/2016. Repeat every 5 years Mammogram status: Completed 12/07/2019. Repeat every year Bone Density status: Completed 12/07/2019; No results as of yet.   Lung Cancer Screening: (Low Dose CT Chest recommended if Age 58-80 years, 30 pack-year currently smoking OR have quit w/in 15years.) does not qualify.   Lung Cancer Screening Referral: no  Additional Screening:  Hepatitis C Screening: does qualify; Completed: yes  Vision Screening: Recommended annual ophthalmology exams for early detection of glaucoma and other disorders of the eye. Is the patient up to date with their annual eye exam?  Yes  Who is the provider or what is the name of the office in which the patient attends annual eye exams? Marygrace Drought, MD If pt is not established with a provider, would they like to be referred to a provider to establish care? No .   Dental Screening: Recommended annual dental exams for proper oral hygiene  Community Resource Referral / Chronic Care Management: CRR required this visit?  No   CCM required this visit?  No      Plan:     I have personally reviewed and noted the following in the patient's chart:   . Medical and social history . Use of alcohol, tobacco or illicit drugs  . Current medications and supplements . Functional ability  and status . Nutritional status . Physical activity . Advanced directives . List of other physicians . Hospitalizations, surgeries, and ER visits in previous 12 months . Vitals . Screenings to include cognitive, depression, and falls . Referrals and appointments  In addition, I have reviewed and discussed with patient certain preventive protocols, quality metrics, and best practice recommendations. A written personalized care plan for preventive services as well as general preventive health recommendations were provided to patient.     Sheral Flow, LPN   [id number]   Nurse Notes: n/a

## 2019-12-08 NOTE — Addendum Note (Signed)
Addended by: Sheral Flow on: 12/08/2019 11:11 AM   Modules accepted: Orders, SmartSet

## 2019-12-23 ENCOUNTER — Other Ambulatory Visit: Payer: Self-pay

## 2019-12-23 ENCOUNTER — Encounter: Payer: Self-pay | Admitting: Internal Medicine

## 2019-12-23 ENCOUNTER — Ambulatory Visit (INDEPENDENT_AMBULATORY_CARE_PROVIDER_SITE_OTHER): Payer: Medicare Other | Admitting: Internal Medicine

## 2019-12-23 VITALS — BP 138/84 | HR 66 | Temp 98.3°F | Resp 16 | Ht 64.0 in | Wt 241.0 lb

## 2019-12-23 DIAGNOSIS — D892 Hypergammaglobulinemia, unspecified: Secondary | ICD-10-CM

## 2019-12-23 DIAGNOSIS — K760 Fatty (change of) liver, not elsewhere classified: Secondary | ICD-10-CM | POA: Diagnosis not present

## 2019-12-23 DIAGNOSIS — E559 Vitamin D deficiency, unspecified: Secondary | ICD-10-CM

## 2019-12-23 DIAGNOSIS — Z0000 Encounter for general adult medical examination without abnormal findings: Secondary | ICD-10-CM

## 2019-12-23 DIAGNOSIS — I10 Essential (primary) hypertension: Secondary | ICD-10-CM

## 2019-12-23 DIAGNOSIS — R739 Hyperglycemia, unspecified: Secondary | ICD-10-CM | POA: Diagnosis not present

## 2019-12-23 DIAGNOSIS — E785 Hyperlipidemia, unspecified: Secondary | ICD-10-CM | POA: Diagnosis not present

## 2019-12-23 DIAGNOSIS — E781 Pure hyperglyceridemia: Secondary | ICD-10-CM

## 2019-12-23 LAB — CBC WITH DIFFERENTIAL/PLATELET
Basophils Absolute: 0 10*3/uL (ref 0.0–0.1)
Basophils Relative: 0.8 % (ref 0.0–3.0)
Eosinophils Absolute: 0.1 10*3/uL (ref 0.0–0.7)
Eosinophils Relative: 2.1 % (ref 0.0–5.0)
HCT: 38.5 % (ref 36.0–46.0)
Hemoglobin: 13.1 g/dL (ref 12.0–15.0)
Lymphocytes Relative: 21 % (ref 12.0–46.0)
Lymphs Abs: 1.1 10*3/uL (ref 0.7–4.0)
MCHC: 34 g/dL (ref 30.0–36.0)
MCV: 93.4 fl (ref 78.0–100.0)
Monocytes Absolute: 0.4 10*3/uL (ref 0.1–1.0)
Monocytes Relative: 7.5 % (ref 3.0–12.0)
Neutro Abs: 3.6 10*3/uL (ref 1.4–7.7)
Neutrophils Relative %: 68.6 % (ref 43.0–77.0)
Platelets: 232 10*3/uL (ref 150.0–400.0)
RBC: 4.13 Mil/uL (ref 3.87–5.11)
RDW: 13 % (ref 11.5–15.5)
WBC: 5.2 10*3/uL (ref 4.0–10.5)

## 2019-12-23 LAB — PROTIME-INR
INR: 1.1 ratio — ABNORMAL HIGH (ref 0.8–1.0)
Prothrombin Time: 11.9 s (ref 9.6–13.1)

## 2019-12-23 LAB — LIPID PANEL
Cholesterol: 178 mg/dL (ref 0–200)
HDL: 47.9 mg/dL (ref >39.00)
NonHDL: 129.79
Total CHOL/HDL Ratio: 4
Triglycerides: 261 mg/dL — ABNORMAL HIGH (ref 0.0–149.0)
VLDL: 52.2 mg/dL — ABNORMAL HIGH (ref 0.0–40.0)

## 2019-12-23 LAB — HEPATIC FUNCTION PANEL
ALT: 39 U/L — ABNORMAL HIGH (ref 0–35)
AST: 34 U/L (ref 0–37)
Albumin: 4.4 g/dL (ref 3.5–5.2)
Alkaline Phosphatase: 73 U/L (ref 39–117)
Bilirubin, Direct: 0 mg/dL (ref 0.0–0.3)
Total Bilirubin: 0.7 mg/dL (ref 0.2–1.2)
Total Protein: 7.9 g/dL (ref 6.0–8.3)

## 2019-12-23 LAB — BASIC METABOLIC PANEL
BUN: 15 mg/dL (ref 6–23)
CO2: 27 mEq/L (ref 19–32)
Calcium: 9.7 mg/dL (ref 8.4–10.5)
Chloride: 101 mEq/L (ref 96–112)
Creatinine, Ser: 0.8 mg/dL (ref 0.40–1.20)
GFR: 74.68 mL/min (ref >60.00)
Glucose, Bld: 137 mg/dL — ABNORMAL HIGH (ref 70–99)
Potassium: 3.6 mEq/L (ref 3.5–5.1)
Sodium: 139 mEq/L (ref 135–145)

## 2019-12-23 LAB — TSH: TSH: 1.79 u[IU]/mL (ref 0.35–4.50)

## 2019-12-23 LAB — LDL CHOLESTEROL, DIRECT: Direct LDL: 92 mg/dL

## 2019-12-23 LAB — VITAMIN D 25 HYDROXY (VIT D DEFICIENCY, FRACTURES): VITD: 24.58 ng/mL — ABNORMAL LOW (ref 30.00–100.00)

## 2019-12-23 LAB — HEMOGLOBIN A1C: Hgb A1c MFr Bld: 5.9 % (ref 4.6–6.5)

## 2019-12-23 NOTE — Patient Instructions (Signed)
Health Maintenance, Female Adopting a healthy lifestyle and getting preventive care are important in promoting health and wellness. Ask your health care provider about:  The right schedule for you to have regular tests and exams.  Things you can do on your own to prevent diseases and keep yourself healthy. What should I know about diet, weight, and exercise? Eat a healthy diet   Eat a diet that includes plenty of vegetables, fruits, low-fat dairy products, and lean protein.  Do not eat a lot of foods that are high in solid fats, added sugars, or sodium. Maintain a healthy weight Body mass index (BMI) is used to identify weight problems. It estimates body fat based on height and weight. Your health care provider can help determine your BMI and help you achieve or maintain a healthy weight. Get regular exercise Get regular exercise. This is one of the most important things you can do for your health. Most adults should:  Exercise for at least 150 minutes each week. The exercise should increase your heart rate and make you sweat (moderate-intensity exercise).  Do strengthening exercises at least twice a week. This is in addition to the moderate-intensity exercise.  Spend less time sitting. Even light physical activity can be beneficial. Watch cholesterol and blood lipids Have your blood tested for lipids and cholesterol at 70 years of age, then have this test every 5 years. Have your cholesterol levels checked more often if:  Your lipid or cholesterol levels are high.  You are older than 70 years of age.  You are at high risk for heart disease. What should I know about cancer screening? Depending on your health history and family history, you may need to have cancer screening at various ages. This may include screening for:  Breast cancer.  Cervical cancer.  Colorectal cancer.  Skin cancer.  Lung cancer. What should I know about heart disease, diabetes, and high blood  pressure? Blood pressure and heart disease  High blood pressure causes heart disease and increases the risk of stroke. This is more likely to develop in people who have high blood pressure readings, are of African descent, or are overweight.  Have your blood pressure checked: ? Every 3-5 years if you are 18-39 years of age. ? Every year if you are 40 years old or older. Diabetes Have regular diabetes screenings. This checks your fasting blood sugar level. Have the screening done:  Once every three years after age 40 if you are at a normal weight and have a low risk for diabetes.  More often and at a younger age if you are overweight or have a high risk for diabetes. What should I know about preventing infection? Hepatitis B If you have a higher risk for hepatitis B, you should be screened for this virus. Talk with your health care provider to find out if you are at risk for hepatitis B infection. Hepatitis C Testing is recommended for:  Everyone born from 1945 through 1965.  Anyone with known risk factors for hepatitis C. Sexually transmitted infections (STIs)  Get screened for STIs, including gonorrhea and chlamydia, if: ? You are sexually active and are younger than 70 years of age. ? You are older than 70 years of age and your health care provider tells you that you are at risk for this type of infection. ? Your sexual activity has changed since you were last screened, and you are at increased risk for chlamydia or gonorrhea. Ask your health care provider if   you are at risk.  Ask your health care provider about whether you are at high risk for HIV. Your health care provider may recommend a prescription medicine to help prevent HIV infection. If you choose to take medicine to prevent HIV, you should first get tested for HIV. You should then be tested every 3 months for as long as you are taking the medicine. Pregnancy  If you are about to stop having your period (premenopausal) and  you may become pregnant, seek counseling before you get pregnant.  Take 400 to 800 micrograms (mcg) of folic acid every day if you become pregnant.  Ask for birth control (contraception) if you want to prevent pregnancy. Osteoporosis and menopause Osteoporosis is a disease in which the bones lose minerals and strength with aging. This can result in bone fractures. If you are 65 years old or older, or if you are at risk for osteoporosis and fractures, ask your health care provider if you should:  Be screened for bone loss.  Take a calcium or vitamin D supplement to lower your risk of fractures.  Be given hormone replacement therapy (HRT) to treat symptoms of menopause. Follow these instructions at home: Lifestyle  Do not use any products that contain nicotine or tobacco, such as cigarettes, e-cigarettes, and chewing tobacco. If you need help quitting, ask your health care provider.  Do not use street drugs.  Do not share needles.  Ask your health care provider for help if you need support or information about quitting drugs. Alcohol use  Do not drink alcohol if: ? Your health care provider tells you not to drink. ? You are pregnant, may be pregnant, or are planning to become pregnant.  If you drink alcohol: ? Limit how much you use to 0-1 drink a day. ? Limit intake if you are breastfeeding.  Be aware of how much alcohol is in your drink. In the [place] one drink equals one 12 oz bottle of beer (355 mL), one 5 oz glass of wine (148 mL), or one 1 oz glass of hard liquor (44 mL). General instructions  Schedule regular health, dental, and eye exams.  Stay current with your vaccines.  Tell your health care provider if: ? You often feel depressed. ? You have ever been abused or do not feel safe at home. Summary  Adopting a healthy lifestyle and getting preventive care are important in promoting health and wellness.  Follow your health care provider's instructions about healthy  diet, exercising, and getting tested or screened for diseases.  Follow your health care provider's instructions on monitoring your cholesterol and blood pressure. This information is not intended to replace advice given to you by your health care provider. Make sure you discuss any questions you have with your health care provider. Document Revised: 02/26/2018 Document Reviewed: 02/26/2018 Elsevier Patient Education  2020 [employer]  

## 2019-12-23 NOTE — Progress Notes (Signed)
Subjective:  Patient ID: Stephanie Pham, female    DOB: 28-Nov-1950  Age: 69 y.o. MRN: [id number]  CC: Hypertension, Annual Exam, and Hyperlipidemia  This visit occurred during the SARS-CoV-2 public health emergency.  Safety protocols were in place, including screening questions prior to the visit, additional usage of staff PPE, and extensive cleaning of exam room while observing appropriate contact time as indicated for disinfecting solutions.    HPI Stephanie Pham presents for a CPX.  She continues to struggle with nervousness and anxiety.  She said she gets adequate symptom relief with Tranxene.  She is not willing to take an antidepressant.  She continues to complain of weight gain and dyspnea on exertion.  She denies chest pain, diaphoresis, dizziness, or lightheadedness.  She tells me that she is followed closely by cardiology.  Outpatient Medications Prior to Visit  Medication Sig Dispense Refill   acetaminophen (TYLENOL) 500 MG tablet Take 500 mg by mouth every 6 (six) hours as needed.     aspirin (BAYER LOW DOSE) 81 MG EC tablet      atenolol (TENORMIN) 50 MG tablet Take 1 tablet (50 mg total) by mouth daily. 90 tablet 0   clorazepate (TRANXENE) 7.5 MG tablet TAKE 1 TABLET BY MOUTH 4  TIMES DAILY AS NEEDED FOR  ANXIETY 360 tablet 0   fosinopril-hydrochlorothiazide (MONOPRIL-HCT) 20-12.5 MG tablet Take 1 tablet by mouth daily. 90 tablet 0   Krill Oil 300 MG CAPS Take 900 mg by mouth.      Multiple Vitamins-Minerals (ICAPS AREDS 2 PO) Take 1 capsule by mouth 2 (two) times daily.     simvastatin (ZOCOR) 20 MG tablet TAKE 1 TABLET BY MOUTH  DAILY AT 6 PM 90 tablet 3   Cholecalciferol 2000 units TABS Take 1 tablet (2,000 Units total) by mouth daily. 90 tablet 3   No facility-administered medications prior to visit.    ROS Review of Systems  Constitutional: Positive for unexpected weight change (wt gain). Negative for appetite change, chills, diaphoresis and fatigue.    HENT: Negative for sore throat and trouble swallowing.   Eyes: Negative.   Respiratory: Positive for shortness of breath (DOE). Negative for cough, chest tightness and wheezing.   Cardiovascular: Negative for chest pain, palpitations and leg swelling.  Gastrointestinal: Negative for abdominal pain, constipation, diarrhea, nausea and vomiting.  Endocrine: Negative.   Genitourinary: Negative.  Negative for difficulty urinating.  Musculoskeletal: Negative.  Negative for arthralgias and myalgias.  Skin: Negative.  Negative for color change, pallor and rash.  Neurological: Positive for tremors. Negative for dizziness, numbness and headaches.  Hematological: Negative for adenopathy. Does not bruise/bleed easily.  Psychiatric/Behavioral: Negative for behavioral problems, decreased concentration, dysphoric mood and suicidal ideas. The patient is nervous/anxious.     Objective:  BP 138/84    Pulse 66    Temp 98.3 F (36.8 C) (Oral)    Resp 16    Ht 5\' 4"  (1.626 m)    Wt 241 lb (109.3 kg)    SpO2 96%    BMI 41.37 kg/m   BP Readings from Last 3 Encounters:  12/23/19 138/84  12/08/19 130/76  04/06/19 124/70    Wt Readings from Last 3 Encounters:  12/23/19 241 lb (109.3 kg)  12/08/19 239 lb 3.2 oz (108.5 kg)  04/06/19 238 lb 9.6 oz (108.2 kg)    Physical Exam Vitals reviewed.  Constitutional:      General: She is not in acute distress.    Appearance: She is  obese. She is ill-appearing. She is not toxic-appearing or diaphoretic.  HENT:     Nose: Nose normal.     Mouth/Throat:     Mouth: Mucous membranes are moist.  Eyes:     General: No scleral icterus.    Conjunctiva/sclera: Conjunctivae normal.  Cardiovascular:     Rate and Rhythm: Normal rate and regular rhythm.     Heart sounds: No murmur heard.   Pulmonary:     Effort: Pulmonary effort is normal.     Breath sounds: No stridor. No wheezing, rhonchi or rales.  Abdominal:     General: Abdomen is protuberant. Bowel sounds are  normal. There is no distension.     Palpations: Abdomen is soft. There is no hepatomegaly, splenomegaly or mass.     Tenderness: There is no abdominal tenderness.  Musculoskeletal:        General: No swelling. Normal range of motion.     Cervical back: Neck supple.     Right lower leg: No edema.     Left lower leg: No edema.  Lymphadenopathy:     Cervical: No cervical adenopathy.  Skin:    General: Skin is warm and dry.     Coloration: Skin is not pale.  Neurological:     General: No focal deficit present.     Mental Status: She is alert and oriented to person, place, and time. Mental status is at baseline.     Motor: Tremor (at rest) present.  Psychiatric:        Attention and Perception: Attention and perception normal.        Mood and Affect: Mood is anxious. Mood is not depressed or elated. Affect is not flat.        Speech: Speech is tangential. Speech is not rapid and pressured or slurred.        Behavior: Behavior normal. Behavior is cooperative.        Thought Content: Thought content normal. Thought content is not paranoid or delusional. Thought content does not include homicidal or suicidal ideation.     Lab Results  Component Value Date   WBC 5.2 12/23/2019   HGB 13.1 12/23/2019   HCT 38.5 12/23/2019   PLT 232.0 12/23/2019   GLUCOSE 137 (H) 12/23/2019   CHOL 178 12/23/2019   TRIG 261.0 (H) 12/23/2019   HDL 47.90 12/23/2019   LDLDIRECT 92.0 12/23/2019   ALT 39 (H) 12/23/2019   AST 34 12/23/2019   NA 139 12/23/2019   K 3.6 12/23/2019   CL 101 12/23/2019   CREATININE 0.80 12/23/2019   BUN 15 12/23/2019   CO2 27 12/23/2019   TSH 1.79 12/23/2019   INR 1.1 (H) 12/23/2019   HGBA1C 5.9 12/23/2019    No results found.  Assessment & Plan:   Stephanie Pham was seen today for hypertension, annual exam and hyperlipidemia.  Diagnoses and all orders for this visit:  Essential hypertension- Her blood pressure is well controlled.  Electrolytes and renal function are  normal.  I think the dyspnea on exertion is due to obesity and poor conditioning. -     BASIC METABOLIC PANEL WITH GFR; Future -     TSH; Future -     Basic Metabolic Panel (BMET); Future -     Basic Metabolic Panel (BMET) -     TSH  Fatty liver disease, nonalcoholic- Her liver enzymes remain mildly elevated.  Her MELD score is reassuring at 7.  I recommended that she start taking pioglitazone to reduce  the liver inflammation. -     Hepatic function panel; Future -     Protime-INR; Future -     Hepatic function panel -     Protime-INR  Hyperglycemia- She is prediabetic with an A1c of 5.9%.  I recommended that she start taking pioglitazone. -     BASIC METABOLIC PANEL WITH GFR; Future -     Hemoglobin A1c; Future -     Basic Metabolic Panel (BMET); Future -     Basic Metabolic Panel (BMET) -     Hemoglobin A1c  Hyperlipidemia with target LDL less than 130- She has achieved her LDL goal and is doing well on the statin. -     Lipid panel; Future -     TSH; Future -     Lipid panel -     TSH  Hypertriglyceridemia- Her triglycerides are mildly elevated but not high enough to warrant medical therapy.  She agrees to improve her lifestyle modifications. -     Lipid panel; Future -     Lipid panel  Routine health maintenance- Exam completed, labs reviewed, vaccines reviewed and updated, cancer screenings are up-to-date, patient education material was given.  Vitamin D deficiency -     VITAMIN D 25 Hydroxy (Vit-D Deficiency, Fractures); Future -     VITAMIN D 25 Hydroxy (Vit-D Deficiency, Fractures) -     Cholecalciferol 50 MCG (2000 UT) TABS; Take 1 tablet (2,000 Units total) by mouth daily.  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  Labs are negative for any concerns for lymphoproliferative disease.  This has resolved. -     CBC with Differential/Platelet; Future -     Hepatic function panel; Future -     CBC with Differential/Platelet -     Hepatic function panel  Other  orders -     LDL cholesterol, direct   I have changed Stephanie Pham's Cholecalciferol. I am also having her start on pioglitazone. Additionally, I am having her maintain her Krill Oil, acetaminophen, aspirin, Multiple Vitamins-Minerals (ICAPS AREDS 2 PO), simvastatin, clorazepate, atenolol, and fosinopril-hydrochlorothiazide.  Meds ordered this encounter  Medications   Cholecalciferol 50 MCG (2000 UT) TABS    Sig: Take 1 tablet (2,000 Units total) by mouth daily.    Dispense:  90 tablet    Refill:  1   pioglitazone (ACTOS) 15 MG tablet    Sig: Take 1 tablet (15 mg total) by mouth daily.    Dispense:  90 tablet    Refill:  1   In addition to time spent on CPE, I spent 50 minutes in preparing to see the patient by review of recent labs, imaging and procedures, obtaining and reviewing separately obtained history, communicating with the patient and family or caregiver, ordering medications, tests or procedures, and documenting clinical information in the EHR including the differential Dx, treatment, and any further evaluation and other management of 1. Essential hypertension 2. Fatty liver disease, nonalcoholic 3. Hyperglycemia 4. Hyperlipidemia with target LDL less than 130 5. Hypertriglyceridemia 6. Vitamin D deficiency 7. Paraproteinemia     Follow-up: Return in about 6 months (around 06/22/2020).  Scarlette Calico, MD

## 2019-12-24 ENCOUNTER — Telehealth: Payer: Self-pay | Admitting: Internal Medicine

## 2019-12-24 MED ORDER — CHOLECALCIFEROL 50 MCG (2000 UT) PO TABS: 1.0000 | ORAL_TABLET | Freq: Every day | ORAL | 1 refills | Status: DC

## 2019-12-24 NOTE — Telephone Encounter (Signed)
Patient is asking-  Did you want her to increase her vitamin D since it is 6 points low?  She already takes 2000mg  of Vitamin D3  And has been for years  Please call the patient [phone]

## 2019-12-28 MED ORDER — PIOGLITAZONE HCL 15 MG PO TABS: 15.0000 mg | ORAL_TABLET | Freq: Every day | ORAL | 1 refills | Status: DC

## 2020-02-01 ENCOUNTER — Encounter: Payer: Self-pay | Admitting: Internal Medicine

## 2020-02-01 ENCOUNTER — Ambulatory Visit: Payer: Medicare Other | Admitting: Internal Medicine

## 2020-02-01 ENCOUNTER — Other Ambulatory Visit: Payer: Self-pay

## 2020-02-01 VITALS — BP 122/70 | HR 69 | Ht 62.0 in | Wt 238.0 lb

## 2020-02-01 DIAGNOSIS — I10 Essential (primary) hypertension: Secondary | ICD-10-CM | POA: Diagnosis not present

## 2020-02-01 DIAGNOSIS — E782 Mixed hyperlipidemia: Secondary | ICD-10-CM | POA: Diagnosis not present

## 2020-02-01 NOTE — Patient Instructions (Signed)
Medication Instructions:  No changes *If you need a refill on your cardiac medications before your next appointment, please call your pharmacy*   Lab Work: none If you have labs (blood work) drawn today and your tests are completely normal, you will receive your results only by: MyChart Message (if you have MyChart) OR A paper copy in the mail If you have any lab test that is abnormal or we need to change your treatment, we will call you to review the results.   Testing/Procedures: none   Follow-Up: At [facility], you and your health needs are our priority.  As part of our continuing mission to provide you with exceptional heart care, we have created designated Provider Care Teams.  These Care Teams include your primary Cardiologist (physician) and Advanced Practice Providers (APPs -  Physician Assistants and Nurse Practitioners) who all work together to provide you with the care you need, when you need it.  Your next appointment:   9 month(s)  The format for your next appointment:   In Person  Provider:   You may see Paula Ross, MD or one of the following Advanced Practice Providers on your designated Care Team:   Scott Weaver, PA-C Vin Bhagat, PA-C   Other Instructions   

## 2020-02-01 NOTE — Progress Notes (Signed)
Cardiology Office Note   Date:  02/01/2020   ID:  Stephanie Pham, DOB 1950/09/18, MRN [id number]  PCP:  [name], MD  Cardiologist:   Dorris Carnes, MD    Pt presents for f/u of HTN and SOB   History of Present Illness: Stephanie Pham is a 69 y.o. female with a history of HTN and dyspea I saw her in Jan 2021  Since I saw her, the patient says her breathing has been stable.  She denies any significant chest pain.  She does have some complaints of abdominal discomfort she talks about possible ventral hernia.  She is trying to watch what she eats, knows she probably needs to do better on the carbohydrates  Outpatient Medications Prior to Visit  Medication Sig Dispense Refill  . acetaminophen (TYLENOL) 500 MG tablet Take 500 mg by mouth every 6 (six) hours as needed.    Marland Kitchen aspirin (BAYER LOW DOSE) 81 MG EC tablet     . atenolol (TENORMIN) 50 MG tablet Take 1 tablet (50 mg total) by mouth daily. 90 tablet 0  . Cholecalciferol 50 MCG (2000 UT) TABS Take 1 tablet (2,000 Units total) by mouth daily. 90 tablet 1  . clorazepate (TRANXENE) 7.5 MG tablet TAKE 1 TABLET BY MOUTH 4  TIMES DAILY AS NEEDED FOR  ANXIETY 360 tablet 0  . fosinopril-hydrochlorothiazide (MONOPRIL-HCT) 20-12.5 MG tablet Take 1 tablet by mouth daily. 90 tablet 0  . Krill Oil 300 MG CAPS Take 900 mg by mouth.     . Multiple Vitamins-Minerals (ICAPS AREDS 2 PO) Take 1 capsule by mouth 2 (two) times daily.    . simvastatin (ZOCOR) 20 MG tablet TAKE 1 TABLET BY MOUTH  DAILY AT 6 PM 90 tablet 3  . pioglitazone (ACTOS) 15 MG tablet Take 1 tablet (15 mg total) by mouth daily. 90 tablet 1   No facility-administered medications prior to visit.     Allergies:   Codeine, Iohexol, Ivp dye [iodinated diagnostic agents], Minocycline, and Sulfonamide derivatives   Past Medical History:  Diagnosis Date  . Allergy   . Anemia   . Blood transfusion without reported diagnosis 1976  . Cancer ([place])    colon cancer 1976  .  Cataract    both eyes-surgery  . Chronic anxiety   . Chronic depression   . Diverticulosis of colon    patient said it may have been diverticulitis on cipro for a long period  . Dyslipidemia   . Hemorrhoids   . Hyperlipidemia   . Hypertension   . Internal hemorrhoid   . Macular degeneration   . Osteoporosis    neck and hip osteopenia    Past Surgical History:  Procedure Laterality Date  . ABDOMINAL HYSTERECTOMY    . CATARACT EXTRACTION     left eye 05-13-2013  . CHOLECYSTECTOMY    . COLON SURGERY  2008  . COLONOSCOPY    . laparotomy and removal of polyp    . sigmoid colectomy       Social History:  The patient  reports that she has never smoked. She has never used smokeless tobacco. She reports that she does not drink alcohol and does not use drugs.   Family History:  The patient's family history includes AAA (abdominal aortic aneurysm) in her paternal aunt; Bipolar disorder in her brother and another family member; Breast cancer in her maternal aunt; COPD in her brother; Cancer in her cousin; Diabetes in her brother, father, paternal aunt, paternal  grandmother, and paternal uncle; Endometriosis in her daughter; Fibromyalgia in her mother; Heart attack in her brother; Heart attack (age of onset: 55) in her father; Heart disease in her father; Hypertension in her mother; Kidney disease in her brother; Other in her daughter, maternal aunt, maternal grandfather, mother, and paternal grandmother; Ovarian cysts in her mother; Skin cancer in her brother; Stroke in her maternal grandfather, paternal aunt, paternal aunt, and paternal grandfather.    ROS:  Please see the history of present illness. All other systems are reviewed and  Negative to the above problem except as noted.    PHYSICAL EXAM: VS:  BP 122/70   Pulse 69   Ht 5\' 2"  (1.575 m)   Wt 238 lb (108 kg)   SpO2 96%   BMI 43.53 kg/m   GEN: Morbidly obese 69  , in no acute distress  HEENT: normal  Neck: Neck is full   No  carotid bruits   Cardiac: RRR; no murmursTr LE  dema  Respiratory:  clear to auscultation bilaterally, normal work of breathing GI: soft, Obese  Nontender  MS: no deformity Moving all extremities   Skin: warm   very dry and scaling Neuro:  Strength and sensation are intact Psych: euthymic mood, full affect   EKG:  EKG is not done today  Lipid Panel    Component Value Date/Time   CHOL 178 12/23/2019 0926   TRIG 261.0 (H) 12/23/2019 0926   HDL 47.90 12/23/2019 0926   CHOLHDL 4 12/23/2019 0926   VLDL 52.2 (H) 12/23/2019 0926   LDLDIRECT 92.0 12/23/2019 0926      Wt Readings from Last 3 Encounters:  02/01/20 238 lb (108 kg)  12/23/19 241 lb (109.3 kg)  12/08/19 239 lb 3.2 oz (108.5 kg)      ASSESSMENT AND PLAN:  1  Dyspnea: Blood breathing remains stable.  Overall from a fluid standpoint it appears okay.  I would keep on same regimen  2.  Hypertension: good control today.  Continue on current meds  3.  Dyslipidemia.  LDL 92 HDL 48.  Triglycerides 261.  She needs to watch her sugars/carbohydrates.  Asked if she want to participate in her dietary study.  She does not want to come in person   4  Morbid obesity discussed diet some.  Again I think she be a good candidate for further counseling.  We will be in touch with her  Plan for follow-up in August     Current medicines are reviewed at length with the patient today.  The patient does not have concerns regarding medicines.  Signed, Dorris Carnes, MD  02/01/2020 10:27 AM    [address], [place], [place]  [address] Phone: [phone]; Fax: [phone]

## 2020-02-05 ENCOUNTER — Other Ambulatory Visit: Payer: Self-pay | Admitting: Internal Medicine

## 2020-02-05 DIAGNOSIS — I10 Essential (primary) hypertension: Secondary | ICD-10-CM

## 2020-02-19 DIAGNOSIS — H524 Presbyopia: Secondary | ICD-10-CM | POA: Diagnosis not present

## 2020-02-19 DIAGNOSIS — H353132 Nonexudative age-related macular degeneration, bilateral, intermediate dry stage: Secondary | ICD-10-CM | POA: Diagnosis not present

## 2020-02-19 DIAGNOSIS — H26492 Other secondary cataract, left eye: Secondary | ICD-10-CM | POA: Diagnosis not present

## 2020-02-23 ENCOUNTER — Other Ambulatory Visit: Payer: Self-pay | Admitting: Internal Medicine

## 2020-02-23 DIAGNOSIS — F411 Generalized anxiety disorder: Secondary | ICD-10-CM

## 2020-04-25 ENCOUNTER — Other Ambulatory Visit: Payer: Self-pay | Admitting: Internal Medicine

## 2020-05-11 ENCOUNTER — Telehealth: Payer: Self-pay | Admitting: Internal Medicine

## 2020-05-11 NOTE — Progress Notes (Signed)
  Chronic Care Management   Note  05/11/2020 Name: DELITHA ELMS MRN: [id number] DOB: [birth year]  GENOVA KINER is a 70 y.o. year old female who is a primary care patient of [name], MD. I reached out to Judieth Keens by phone today in response to a referral sent by Ms. Julieta Bellini PCP, [name], MD.   Ms. Garretson was given information about Chronic Care Management services today including:  1. CCM service includes personalized support from designated clinical staff supervised by her physician, including individualized plan of care and coordination with other care providers 2. 24/7 contact phone numbers for assistance for urgent and routine care needs. 3. Service will only be billed when office clinical staff spend 20 minutes or more in a month to coordinate care. 4. Only one practitioner may furnish and bill the service in a calendar month. 5. The patient may stop CCM services at any time (effective at the end of the month) by phone call to the office staff.   Patient agreed to services and verbal consent obtained.   Follow up plan:   Carley Perdue [employer] Scheduler

## 2020-06-13 ENCOUNTER — Encounter: Payer: Self-pay | Admitting: Internal Medicine

## 2020-06-13 ENCOUNTER — Ambulatory Visit (INDEPENDENT_AMBULATORY_CARE_PROVIDER_SITE_OTHER): Payer: Medicare Other | Admitting: Internal Medicine

## 2020-06-13 ENCOUNTER — Other Ambulatory Visit: Payer: Self-pay

## 2020-06-13 VITALS — BP 138/82 | HR 65 | Temp 98.9°F | Resp 16 | Ht 62.0 in | Wt 240.0 lb

## 2020-06-13 DIAGNOSIS — R739 Hyperglycemia, unspecified: Secondary | ICD-10-CM | POA: Diagnosis not present

## 2020-06-13 DIAGNOSIS — E559 Vitamin D deficiency, unspecified: Secondary | ICD-10-CM

## 2020-06-13 DIAGNOSIS — I10 Essential (primary) hypertension: Secondary | ICD-10-CM | POA: Diagnosis not present

## 2020-06-13 DIAGNOSIS — D892 Hypergammaglobulinemia, unspecified: Secondary | ICD-10-CM | POA: Diagnosis not present

## 2020-06-13 LAB — HEMOGLOBIN A1C: Hgb A1c MFr Bld: 5.6 % (ref 4.6–6.5)

## 2020-06-13 LAB — BASIC METABOLIC PANEL
BUN: 13 mg/dL (ref 6–23)
CO2: 29 mEq/L (ref 19–32)
Calcium: 9.7 mg/dL (ref 8.4–10.5)
Chloride: 102 mEq/L (ref 96–112)
Creatinine, Ser: 0.71 mg/dL (ref 0.40–1.20)
GFR: 86.09 mL/min (ref >60.00)
Glucose, Bld: 119 mg/dL — ABNORMAL HIGH (ref 70–99)
Potassium: 3.6 mEq/L (ref 3.5–5.1)
Sodium: 141 mEq/L (ref 135–145)

## 2020-06-13 LAB — VITAMIN D 25 HYDROXY (VIT D DEFICIENCY, FRACTURES): VITD: 34.3 ng/mL (ref 30.00–100.00)

## 2020-06-13 NOTE — Patient Instructions (Signed)

## 2020-06-13 NOTE — Progress Notes (Signed)
Subjective:  Patient ID: Stephanie Pham, female    DOB: [birth year]  Age: 70 y.o. MRN: [id number]  CC: Hypertension  This visit occurred during the SARS-CoV-2 public health emergency.  Safety protocols were in place, including screening questions prior to the visit, additional usage of staff PPE, and extensive cleaning of exam room while observing appropriate contact time as indicated for disinfecting solutions.    HPI TONIQUA MELAMED presents for f/up -  She tells me her blood pressure has been well controlled.  She denies any recent episodes of headache, blurred vision, chest pain, shortness of breath, edema, or diaphoresis.  Outpatient Medications Prior to Visit  Medication Sig Dispense Refill  . acetaminophen (TYLENOL) 500 MG tablet Take 500 mg by mouth every 6 (six) hours as needed.    Marland Kitchen aspirin (BAYER LOW DOSE) 81 MG EC tablet     . atenolol (TENORMIN) 50 MG tablet TAKE 1 TABLET BY MOUTH  DAILY 90 tablet 1  . Cholecalciferol 50 MCG (2000 UT) TABS Take 1 tablet (2,000 Units total) by mouth daily. 90 tablet 1  . clorazepate (TRANXENE) 7.5 MG tablet TAKE 1 TABLET BY MOUTH 4  TIMES DAILY AS NEEDED FOR  ANXIETY 360 tablet 1  . fosinopril-hydrochlorothiazide (MONOPRIL-HCT) 20-12.5 MG tablet TAKE 1 TABLET BY MOUTH  DAILY 90 tablet 1  . Krill Oil 300 MG CAPS Take 900 mg by mouth.     . Multiple Vitamins-Minerals (ICAPS AREDS 2 PO) Take 1 capsule by mouth 2 (two) times daily.    . simvastatin (ZOCOR) 20 MG tablet TAKE 1 TABLET BY MOUTH  DAILY AT 6 PM 90 tablet 3   No facility-administered medications prior to visit.    ROS Review of Systems  Constitutional: Negative for diaphoresis and fatigue.  HENT: Negative.   Eyes: Negative for visual disturbance.  Respiratory: Negative for cough, chest tightness, shortness of breath and wheezing.   Cardiovascular: Negative for chest pain, palpitations and leg swelling.  Gastrointestinal: Negative for abdominal pain, constipation, diarrhea,  nausea and vomiting.  Endocrine: Negative.   Genitourinary: Negative.  Negative for difficulty urinating.  Musculoskeletal: Negative for arthralgias and myalgias.  Skin: Negative.  Negative for color change.  Neurological: Negative.  Negative for dizziness, weakness, light-headedness and headaches.  Hematological: Negative for adenopathy. Does not bruise/bleed easily.  Psychiatric/Behavioral: Negative.     Objective:  BP 138/82 (BP Location: Left Arm, Patient Position: Sitting, Cuff Size: Large)   Pulse 65   Temp 98.9 F (37.2 C) (Oral)   Resp 16   Ht 5\' 2"  (1.575 m)   Wt 240 lb (108.9 kg)   SpO2 97%   BMI 43.90 kg/m   BP Readings from Last 3 Encounters:  06/16/20 138/82  02/01/20 122/70  12/23/19 138/84    Wt Readings from Last 3 Encounters:  06/16/20 240 lb (108.9 kg)  02/01/20 238 lb (108 kg)  12/23/19 241 lb (109.3 kg)    Physical Exam Vitals reviewed.  HENT:     Nose: Nose normal.     Mouth/Throat:     Mouth: Mucous membranes are moist.  Eyes:     General: No scleral icterus.    Conjunctiva/sclera: Conjunctivae normal.  Cardiovascular:     Rate and Rhythm: Normal rate and regular rhythm.  Pulmonary:     Breath sounds: No stridor. No wheezing, rhonchi or rales.  Abdominal:     General: Abdomen is protuberant. Bowel sounds are normal. There is no distension.     Palpations: Abdomen  is soft. There is no hepatomegaly, splenomegaly or mass.  Musculoskeletal:        General: Normal range of motion.     Cervical back: Neck supple.     Right lower leg: No edema.     Left lower leg: No edema.  Lymphadenopathy:     Cervical: No cervical adenopathy.  Skin:    General: Skin is warm and dry.  Neurological:     General: No focal deficit present.     Mental Status: She is alert.  Psychiatric:        Mood and Affect: Mood normal.        Behavior: Behavior normal.     Lab Results  Component Value Date   WBC 5.2 12/23/2019   HGB 13.1 12/23/2019   HCT 38.5  12/23/2019   PLT 232.0 12/23/2019   GLUCOSE 119 (H) 06/13/2020   CHOL 178 12/23/2019   TRIG 261.0 (H) 12/23/2019   HDL 47.90 12/23/2019   LDLDIRECT 92.0 12/23/2019   ALT 39 (H) 12/23/2019   AST 34 12/23/2019   NA 141 06/13/2020   K 3.6 06/13/2020   CL 102 06/13/2020   CREATININE 0.71 06/13/2020   BUN 13 06/13/2020   CO2 29 06/13/2020   TSH 1.79 12/23/2019   INR 1.1 (H) 12/23/2019   HGBA1C 5.6 06/13/2020    No results found.  Assessment & Plan:   Ambrielle was seen today for hypertension.  Diagnoses and all orders for this visit:  Essential hypertension-her blood pressure is adequately well controlled. -     Basic metabolic panel; Future -     Basic metabolic panel  Hyperglycemia -     Basic metabolic panel; Future -     Hemoglobin A1c; Future -     Hemoglobin A1c -     Basic metabolic panel  Vitamin D deficiency -     VITAMIN D 25 Hydroxy (Vit-D Deficiency, Fractures); Future -     VITAMIN D 25 Hydroxy (Vit-D Deficiency, Fractures)  Paraproteinemia- Her total protein and SPEP are normal now. -     Protein electrophoresis, serum; Future -     Protein electrophoresis, serum   I am having Judieth Keens maintain her Astrid Drafts, acetaminophen, aspirin, Multiple Vitamins-Minerals (ICAPS AREDS 2 PO), Cholecalciferol, atenolol, fosinopril-hydrochlorothiazide, clorazepate, and simvastatin.  No orders of the defined types were placed in this encounter.    Follow-up: Return in about 6 months (around 12/14/2020).  Scarlette Calico, MD

## 2020-06-15 LAB — PROTEIN ELECTROPHORESIS, SERUM
Albumin ELP: 4.5 g/dL (ref 3.8–4.8)
Alpha 1: 0.3 g/dL (ref 0.2–0.3)
Alpha 2: 0.9 g/dL (ref 0.5–0.9)
Beta 2: 0.5 g/dL (ref 0.2–0.5)
Beta Globulin: 0.5 g/dL (ref 0.4–0.6)
Gamma Globulin: 1.2 g/dL (ref 0.8–1.7)
Total Protein: 7.9 g/dL (ref 6.1–8.1)

## 2020-06-28 ENCOUNTER — Telehealth: Payer: Self-pay

## 2020-06-28 NOTE — Progress Notes (Signed)
    Chronic Care Management Pharmacy Assistant   Name: Stephanie Pham  MRN: [id number] DOB: 1950-04-05   Reason for Encounter: Initial Questions Appointment: Telephone 06/29/20 @ 11 am    Recent office visits:  06/13/20 [facility]-PCP  Recent consult visits:  02/01/20 [facility] visits:  None in previous 6 months  Medications: Outpatient Encounter Medications as of 06/28/2020  Medication Sig  . acetaminophen (TYLENOL) 500 MG tablet Take 500 mg by mouth every 6 (six) hours as needed.  Marland Kitchen aspirin (BAYER LOW DOSE) 81 MG EC tablet   . atenolol (TENORMIN) 50 MG tablet TAKE 1 TABLET BY MOUTH  DAILY  . Cholecalciferol 50 MCG (2000 UT) TABS Take 1 tablet (2,000 Units total) by mouth daily.  . clorazepate (TRANXENE) 7.5 MG tablet TAKE 1 TABLET BY MOUTH 4  TIMES DAILY AS NEEDED FOR  ANXIETY  . fosinopril-hydrochlorothiazide (MONOPRIL-HCT) 20-12.5 MG tablet TAKE 1 TABLET BY MOUTH  DAILY  . Krill Oil 300 MG CAPS Take 900 mg by mouth.   . Multiple Vitamins-Minerals (ICAPS AREDS 2 PO) Take 1 capsule by mouth 2 (two) times daily.  . simvastatin (ZOCOR) 20 MG tablet TAKE 1 TABLET BY MOUTH  DAILY AT 6 PM   No facility-administered encounter medications on file as of 06/28/2020.    Have you seen any other providers since your last visit?  Patient states she seen her cardiologist in Nov. 2021.  Any changes in your medications or health?  Patient states no changes at this time.   Any side effects from any medications?  Patient states no side effects at this time.  Do you have an symptoms or problems not managed by your medications?  Patient states she has tremors she can't control.  Any concerns about your health right now?  Patient states no at this time.  Has your provider asked that you check blood pressure, blood sugar, or follow special diet at home?  Patient states not at this time.  Do you get any type of exercise on a regular basis?  Patient states she  only walk in the house because she is afraid of dogs.  Can you think of a goal you would like to reach for your health?  Patient states she would like to loose weight.  Do you have any problems getting your medications?  Patient states not at this time.  Is there anything that you would like to discuss during the appointment?  Patient states she wants to talke about her Tremors and glucose levels.   Please bring medications and supplements to appointment   Star Rating Drugs: Simvastatin - last filled 05/10/20 90D fosinopril-hydrochlorothiazide - last filled 05/11/20 [place], RMA Clinical Pharmacists Assistant [phone]  Time Spent: 29 mins

## 2020-06-29 ENCOUNTER — Ambulatory Visit (INDEPENDENT_AMBULATORY_CARE_PROVIDER_SITE_OTHER): Payer: Medicare Other | Admitting: Pharmacist

## 2020-06-29 ENCOUNTER — Other Ambulatory Visit: Payer: Self-pay

## 2020-06-29 DIAGNOSIS — E781 Pure hyperglyceridemia: Secondary | ICD-10-CM | POA: Diagnosis not present

## 2020-06-29 DIAGNOSIS — I10 Essential (primary) hypertension: Secondary | ICD-10-CM

## 2020-06-29 DIAGNOSIS — K760 Fatty (change of) liver, not elsewhere classified: Secondary | ICD-10-CM

## 2020-06-29 DIAGNOSIS — E785 Hyperlipidemia, unspecified: Secondary | ICD-10-CM

## 2020-06-29 DIAGNOSIS — M8580 Other specified disorders of bone density and structure, unspecified site: Secondary | ICD-10-CM

## 2020-06-29 DIAGNOSIS — F411 Generalized anxiety disorder: Secondary | ICD-10-CM

## 2020-06-29 NOTE — Progress Notes (Signed)
Chronic Care Management Pharmacy Note  06/29/2020 Name:  Stephanie Pham MRN:  [id number] DOB:  1950-06-07  Subjective: Stephanie Pham is an 70 y.o. year old female who is a primary patient of [name], MD.  The CCM team was consulted for assistance with disease management and care coordination needs.    Engaged with patient by telephone for initial visit in response to provider referral for pharmacy case management and/or care coordination services.   Consent to Services:  The patient was given the following information about Chronic Care Management services today, agreed to services, and gave verbal consent: 1. CCM service includes personalized support from designated clinical staff supervised by the primary care provider, including individualized plan of care and coordination with other care providers 2. 24/7 contact phone numbers for assistance for urgent and routine care needs. 3. Service will only be billed when office clinical staff spend 20 minutes or more in a month to coordinate care. 4. Only one practitioner may furnish and bill the service in a calendar month. 5.The patient may stop CCM services at any time (effective at the end of the month) by phone call to the office staff. 6. The patient will be responsible for cost sharing (co-pay) of up to 20% of the service fee (after annual deductible is met). Patient agreed to services and consent obtained.  Patient Care Team: [name], MD as PCP - General (Internal Medicine) Fay Records, MD as PCP - Cardiology (Cardiology) Druscilla Brownie, MD as Consulting Physician (Dermatology) Marygrace Drought, MD as Consulting Physician Charlton Haws, [facility] as Pharmacist (Pharmacist)   Patient lives at home with her husband. They are both retired and do not follow any specific diet or exercise routine.   Recent office visits: 06/13/20 Dr Ronnald Ramp OV: chronic f/u, labs stable, no med changes.  Recent consult visits: 02/01/20  Dr Harrington Challenger (cardiology): f/u HTN, dyspnea. TRIG 261, watch sugars/carbs.  Hospital visits: None in previous 6 months  Objective:  Lab Results  Component Value Date   CREATININE 0.71 06/13/2020   BUN 13 06/13/2020   GFR 86.09 06/13/2020   GFRNONAA 78.02 12/22/2008   GFRAA  01/11/2007    >60        The eGFR has been calculated using the MDRD equation. This calculation has not been validated in all clinical   NA 141 06/13/2020   K 3.6 06/13/2020   CALCIUM 9.7 06/13/2020   CO2 29 06/13/2020   GLUCOSE 119 (H) 06/13/2020    Lab Results  Component Value Date/Time   HGBA1C 5.6 06/13/2020 11:50 AM   HGBA1C 5.9 12/23/2019 09:26 AM   GFR 86.09 06/13/2020 11:50 AM   GFR 74.68 12/23/2019 09:26 AM    Last diabetic Eye exam: No results found for: HMDIABEYEEXA  Last diabetic Foot exam: No results found for: HMDIABFOOTEX   Lab Results  Component Value Date   CHOL 178 12/23/2019   HDL 47.90 12/23/2019   LDLDIRECT 92.0 12/23/2019   TRIG 261.0 (H) 12/23/2019   CHOLHDL 4 12/23/2019    Hepatic Function Latest Ref Rng & Units 06/13/2020 12/23/2019 08/06/2018  Total Protein 6.1 - 8.1 g/dL 7.9 7.9 8.0  Albumin 3.5 - 5.2 g/dL - 4.4 -  AST 0 - 37 U/L - 34 -  ALT 0 - 35 U/L - 39(H) -  Alk Phosphatase 39 - 117 U/L - 73 -  Total Bilirubin 0.2 - 1.2 mg/dL - 0.7 -  Bilirubin, Direct 0.0 - 0.3 mg/dL -  0.0 -    Lab Results  Component Value Date/Time   TSH 1.79 12/23/2019 09:26 AM   TSH 2.08 08/06/2018 08:38 AM    CBC Latest Ref Rng & Units 12/23/2019 08/06/2018 06/17/2017  WBC 4.0 - 10.5 K/uL 5.2 5.1 5.6  Hemoglobin 12.0 - 15.0 g/dL 13.1 13.3 13.4  Hematocrit 36.0 - 46.0 % 38.5 38.5 39.9  Platelets 150.0 - 400.0 K/uL 232.0 221.0 258.0    Lab Results  Component Value Date/Time   VD25OH 34.30 06/13/2020 11:50 AM   VD25OH 24.58 (L) 12/23/2019 09:26 AM    Clinical ASCVD: No  The 10-year ASCVD risk score Mikey Bussing DC Jr., et al., 2013) is: 14.4%   Values used to calculate the score:     Age:  54 years     Sex: Female     Is Non-Hispanic African American: No     Diabetic: No     Tobacco smoker: No     Systolic Blood Pressure: 401 mmHg     Is BP treated: Yes     HDL Cholesterol: 47.9 mg/dL     Total Cholesterol: 178 mg/dL    Depression screen [facility] 2/9 12/08/2019 11/27/2018 11/22/2017  Decreased Interest 0 1 0  Down, Depressed, Hopeless 0 1 1  PHQ - 2 Score 0 2 1  Altered sleeping - 1 0  Tired, decreased energy - 1 1  Change in appetite - 0 1  Feeling bad or failure about yourself  - 0 0  Trouble concentrating - 0 0  Moving slowly or fidgety/restless - 0 0  Suicidal thoughts - 0 0  PHQ-9 Score - 4 3  Difficult doing work/chores - Not difficult at all Not difficult at all     Social History   Tobacco Use  Smoking Status Never Smoker  Smokeless Tobacco Never Used   BP Readings from Last 3 Encounters:  06/16/20 138/82  02/01/20 122/70  12/23/19 138/84   Pulse Readings from Last 3 Encounters:  06/16/20 65  02/01/20 69  12/23/19 66   Wt Readings from Last 3 Encounters:  06/16/20 240 lb (108.9 kg)  02/01/20 238 lb (108 kg)  12/23/19 241 lb (109.3 kg)   BMI Readings from Last 3 Encounters:  06/16/20 43.90 kg/m  02/01/20 43.53 kg/m  12/23/19 41.37 kg/m    Assessment/Interventions: Review of patient past medical history, allergies, medications, health status, including review of consultants reports, laboratory and other test data, was performed as part of comprehensive evaluation and provision of chronic care management services.   SDOH:  (Social Determinants of Health) assessments and interventions performed: Yes  SDOH Screenings   Alcohol Screen: Low Risk   . Last Alcohol Screening Score (AUDIT): 0  Depression (PHQ2-9): Low Risk   . PHQ-2 Score: 0  Financial Resource Strain: Low Risk   . Difficulty of Paying Living Expenses: Not hard at all  Food Insecurity: No Food Insecurity  . Worried About Charity fundraiser in the Last Year: Never true  . Ran  Out of Food in the Last Year: Never true  Housing: Low Risk   . Last Housing Risk Score: 0  Physical Activity: Inactive  . Days of Exercise per Week: 0 days  . Minutes of Exercise per Session: 0 min  Social Connections: Socially Integrated  . Frequency of Communication with Friends and Family: More than three times a week  . Frequency of Social Gatherings with Friends and Family: More than three times a week  . Attends Religious  Services: More than 4 times per year  . Active Member of Clubs or Organizations: Yes  . Attends Archivist Meetings: More than 4 times per year  . Marital Status: Married  Stress: No Stress Concern Present  . Feeling of Stress : Not at all  Tobacco Use: Low Risk   . Smoking Tobacco Use: Never Smoker  . Smokeless Tobacco Use: Never Used  Transportation Needs: No Transportation Needs  . Lack of Transportation (Medical): No  . Lack of Transportation (Non-Medical): No    CCM Care Plan  Allergies  Allergen Reactions  . Codeine   . Iohexol      Desc: PT developed difficulty swallowing 5 min post injection of 150cc's Omni 300., Onset Date: [id number]   . Ivp Dye [Iodinated Diagnostic Agents] Other (See Comments)    Couldn't swollow  . Minocycline Other (See Comments)    DIZZY  . Sulfonamide Derivatives Hives    Medications Reviewed Today    Reviewed by Charlton Haws, [facility] (Pharmacist) on 06/29/20 at 1156  Med List Status: <None>  Medication Order Taking? Sig Documenting Provider Last Dose Status Informant  acetaminophen (TYLENOL) 500 MG tablet [id number] Yes Take 500 mg by mouth every 6 (six) hours as needed. [provider] Taking Active   aspirin 81 MG EC tablet [id number] Yes  [provider] Taking Active   atenolol (TENORMIN) 50 MG tablet [id number] Yes TAKE 1 TABLET BY MOUTH  DAILY [name], MD Taking Active   Cholecalciferol 50 MCG (2000 UT) TABS [id number] Yes Take 1 tablet (2,000 Units total) by mouth daily.  [name], MD Taking Active   clorazepate (TRANXENE) 7.5 MG tablet [id number] Yes TAKE 1 TABLET BY MOUTH 4  TIMES DAILY AS NEEDED FOR  ANXIETY [name], MD Taking Active   fosinopril-hydrochlorothiazide (MONOPRIL-HCT) 20-12.5 MG tablet [id number] Yes TAKE 1 TABLET BY MOUTH  DAILY [name], MD Taking Active   Krill Oil 300 MG CAPS [id number] Yes Take 900 mg by mouth.  [provider] Taking Active Self  Multiple Vitamins-Minerals (ICAPS AREDS 2 PO) [id number] Yes Take 1 capsule by mouth 2 (two) times daily. [provider] Taking Active   simvastatin (ZOCOR) 20 MG tablet [id number] Yes TAKE 1 TABLET BY MOUTH  DAILY AT 6 PM Fay Records, MD Taking Active           Patient Active Problem List   Diagnosis Date Noted  . Osteopenia 12/23/2017  . OAB (overactive bladder) 08/28/2016  . Hypertriglyceridemia 10/03/2015  . 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 Venous stasis dermatitis of both lower extremities 07/05/2015  . Vitamin D deficiency 06/27/2015  . Fatty liver disease, nonalcoholic 11/94/1740  . Tremor 07/22/2013  . Hyperglycemia 07/22/2013  . GAD (generalized anxiety disorder) 07/22/2013  . Routine health maintenance 02/09/2013  . Obesity, Class III, BMI 40-49.9 (morbid obesity) ([place]) 02/09/2013  . Senile osteoporosis 06/30/2009  . Hyperlipidemia with target LDL less than 130 06/03/2007  . Essential hypertension 12/19/2006    Immunization History  Administered Date(s) Administered  . Fluad Quad(high Dose 65+) 11/27/2018, 12/08/2019  . Influenza, High Dose Seasonal PF 12/27/2015, 11/21/2016, 12/23/2017  . Influenza,inj,Quad PF,6+ Mos 02/09/2013, 11/17/2013, 12/22/2014  . Influenza-Unspecified 12/08/2019  . Pneumococcal Conjugate-13 12/28/2014  . Pneumococcal Polysaccharide-23 12/27/2015  . Tdap 02/09/2013  . Tetanus 02/09/2013    Conditions to be addressed/monitored:  Hypertension, Hyperlipidemia, Anxiety and Osteopenia, Fatty Liver disease  Care  Plan : [place]  Updates made by  Charlton Haws, [place] since 06/29/2020 12:00 AM    Problem: Hypertension, Hyperlipidemia, Anxiety and Osteopenia, Fatty Liver disease   Priority: High    Long-Range Goal: Disease management   Start Date: 06/29/2020  Expected End Date: 12/29/2020  This Visit's Progress: On track  Priority: High  Note:   Current Barriers:  . Unable to independently monitor therapeutic efficacy . Unable to maintain control of obesity / fatty liver disease  Pharmacist Clinical Goal(s):  Marland Kitchen Patient will achieve adherence to monitoring guidelines and medication adherence to achieve therapeutic efficacy . maintain control of obesity/fatty liver disease as evidenced by weight loss  through collaboration with PharmD and provider.   Interventions: . 1:1 collaboration with [name], MD regarding development and update of comprehensive plan of care as evidenced by provider attestation and co-signature . Inter-disciplinary care team collaboration (see longitudinal plan of care) . Comprehensive medication review performed; medication list updated in electronic medical record  Hypertension (BP goal <130/80) -Controlled - BP has been at goal in recent office visits; -Current treatment: . Atenolol 50 mg daily (tremor) . Fosinopril-HCTZ 20-12.5 mg daily -Current home readings: not checking -Current dietary habits: no special diet - eats bread, potatoes, chips  Lunch - hamburger helper, salad  Dinner - snacks (vanilla cream cookies), cabbage, broccoli  -Current exercise habits: none - does own exercise bike -Denies hypotensive/hypertensive symptoms -Educated on BP goals and benefits of medications for prevention of heart attack, stroke and kidney damage; Daily salt intake goal < 2300 mg; Exercise goal of 150 minutes per week;  -Counseled on benefits of atenolol for tremor as well -Counseled to monitor BP at home as needed, document, and provide log at future  appointments -Recommended to continue current medication  Hyperlipidemia: (LDL goal < 100) -Not ideally controlled - LDL is at goal, however Triglycerides are elevated; pt reports taking Krill Oil for a few years, Trig have been 250-280 for the past 4 years. -Current treatment: . Simvastatin 20 mg daily . Aspirin 81 mg daily . Krill Oil 300 mg ([place]) -Educated on Cholesterol goals;  Benefits of statin for ASCVD risk reduction; Importance of limiting foods high in cholesterol; Exercise goal of 150 minutes per week; -Recommended to continue current medication  Anxiety / Tremor (Goal: manage symptoms) -Controlled - taking clorazepate for over 40 years, pt reports symptoms are controlled; without medication she reports she has difficulty leaving her house; she reports she pays $141 for 3 months (used to be free but insurance tier has changed) -Current treatment: . Clorazepate 7.5 mg QID prn -PHQ9: 0 (11/2019) -GAD7: not on file -Connected with PCP for mental health support -Educated on potential side effects of clorazepate including tremor; pt does report tremor in R hand but currently manageable -Recommended to continue current medication  -Apply for tier exception  Osteopenia (Goal prevent fractures) -Controlled - Ca/Vitamin D levels are at goal; pt is not due for repeat DEXA until 11/2021 -Last DEXA Scan: 12/07/2019   T-Score femoral neck: -1.2  T-Score total hip: -0.3  T-Score lumbar spine: +1.5  10-year probability of major osteoporotic fracture: 7.8%  10-year probability of hip fracture: 0.8% -Patient is not a candidate for pharmacologic treatment -Current treatment  . Vitamin D 2000 IU daily -Recommend [phone] units of vitamin D daily. Recommend 1200 mg of calcium daily from dietary and supplemental sources. Recommend weight-bearing and muscle strengthening exercises for building and maintaining bone density. -Recommended to continue current medication  Fatty Liver Disease  (Goal: weight loss) -Not ideally  controlled - pt was prescribed pioglitazone Oct 2021 but never started it;  -Current treatment  . No medications -Educated on fatty liver disease and benefits of weight loss -Counseled on diet and exercise extensively (plate method) - pt agrees to try diet changes and exercise bike  Health Maintenance -Vaccine gaps: covid vaccine, shingrix -Current therapy:  . I Caps BID . Tylenol 500 mg -Patient is satisfied with current therapy and denies issues -Recommended to continue current medication  Patient Goals/Self-Care Activities . Patient will:  - take medications as prescribed focus on medication adherence by routine target a minimum of 150 minutes of moderate intensity exercise weekly engage in dietary modifications by reducing carbs/sugars, increasing vegetables  Follow Up Plan: Telephone follow up appointment with care management team member scheduled for: 3 months      Medication Assistance:  -Applied for tier exception for clorazepate given 40+ year use, currently controlled anxiety and history of withdrawal when taken off the drug  Patient's preferred pharmacy is:  [facility], [address], [address] [place], [place] [address] Phone: [phone] Fax: [phone]  Uses pill box? No - prefers bottles Pt endorses 100% compliance  We discussed: Current pharmacy is preferred with insurance plan and patient is satisfied with pharmacy services Patient decided to: Continue current medication management strategy  Care Plan and Follow Up Patient Decision:  Patient agrees to Care Plan and Follow-up.  Plan: Telephone follow up appointment with care management team member scheduled for:  3 months  Charlene Brooke, PharmD, [place], CPP Clinical Pharmacist [facility] [phone]

## 2020-06-29 NOTE — Patient Instructions (Addendum)
Visit Information  Phone number for Pharmacist: [phone]  Thank you for meeting with me to discuss your medications! I look forward to working with you to achieve your health care goals. Below is a summary of what we talked about during the visit:  Goals Addressed            This Visit's Progress   . Manage My Medicine       Timeframe:  Long-Range Goal Priority:  Medium Start Date:     06/29/20                        Expected End Date:      01/15/21                 Follow Up Date 10/15/20   - call for medicine refill 2 or 3 days before it runs out - call if I am sick and can't take my medicine - keep a list of all the medicines I take; vitamins and herbals too    Why is this important?   . These steps will help you keep on track with your medicines.   Notes:       Patient Care Plan: CCM Pharmacy Care Plan    Problem Identified: Hypertension, Hyperlipidemia, Anxiety and Osteopenia, Fatty Liver disease   Priority: High    Long-Range Goal: Disease management   Start Date: 06/29/2020  Expected End Date: 12/29/2020  This Visit's Progress: On track  Priority: High  Note:   Current Barriers:  . Unable to independently monitor therapeutic efficacy . Unable to maintain control of obesity / fatty liver disease  Pharmacist Clinical Goal(s):  Marland Kitchen Patient will achieve adherence to monitoring guidelines and medication adherence to achieve therapeutic efficacy . maintain control of obesity/fatty liver disease as evidenced by weight loss  through collaboration with PharmD and provider.   Interventions: . 1:1 collaboration with [name], MD regarding development and update of comprehensive plan of care as evidenced by provider attestation and co-signature . Inter-disciplinary care team collaboration (see longitudinal plan of care) . Comprehensive medication review performed; medication list updated in electronic medical record  Hypertension (BP goal <130/80) -Controlled -  BP has been at goal in recent office visits; -Current treatment: . Atenolol 50 mg daily (tremor) . Fosinopril-HCTZ 20-12.5 mg daily -Current home readings: not checking -Current dietary habits: no special diet - eats bread, potatoes, chips  Lunch - hamburger helper, salad  Dinner - snacks (vanilla cream cookies), cabbage, broccoli  -Current exercise habits: none - does own exercise bike -Denies hypotensive/hypertensive symptoms -Educated on BP goals and benefits of medications for prevention of heart attack, stroke and kidney damage; Daily salt intake goal < 2300 mg; Exercise goal of 150 minutes per week;  -Counseled on benefits of atenolol for tremor as well -Counseled to monitor BP at home as needed, document, and provide log at future appointments -Recommended to continue current medication  Hyperlipidemia: (LDL goal < 100) -Not ideally controlled - LDL is at goal, however Triglycerides are elevated; pt reports taking Krill Oil for a few years, Trig have been 250-280 for the past 4 years. -Current treatment: . Simvastatin 20 mg daily . Aspirin 81 mg daily . Krill Oil 300 mg (Megared) -Educated on Cholesterol goals;  Benefits of statin for ASCVD risk reduction; Importance of limiting foods high in cholesterol; Exercise goal of 150 minutes per week; -Recommended to continue current medication  Anxiety / Tremor (Goal: manage symptoms) -Controlled -  taking clorazepate for over 40 years, pt reports symptoms are controlled; without medication she reports she has difficulty leaving her house; she reports she pays $141 for 3 months (used to be free but insurance tier has changed) -Current treatment: . Clorazepate 7.5 mg QID prn -PHQ9: 0 (11/2019) -GAD7: not on file -Connected with PCP for mental health support -Educated on potential side effects of clorazepate including tremor; pt does report tremor in R hand but currently manageable -Recommended to continue current medication  -Apply  for tier exception  Osteopenia (Goal prevent fractures) -Controlled - Ca/Vitamin D levels are at goal; pt is not due for repeat DEXA until 11/2021 -Last DEXA Scan: 12/07/2019   T-Score femoral neck: -1.2  T-Score total hip: -0.3  T-Score lumbar spine: +1.5  10-year probability of major osteoporotic fracture: 7.8%  10-year probability of hip fracture: 0.8% -Patient is not a candidate for pharmacologic treatment -Current treatment  . Vitamin D 2000 IU daily -Recommend [phone] units of vitamin D daily. Recommend 1200 mg of calcium daily from dietary and supplemental sources. Recommend weight-bearing and muscle strengthening exercises for building and maintaining bone density. -Recommended to continue current medication  Fatty Liver Disease (Goal: weight loss) -Not ideally controlled - pt was prescribed pioglitazone Oct 2021 but never started it;  -Current treatment  . No medications -Educated on fatty liver disease and benefits of weight loss -Counseled on diet and exercise extensively (plate method) - pt agrees to try diet changes and exercise bike  Health Maintenance -Vaccine gaps: covid vaccine, shingrix -Current therapy:  . I Caps BID . Tylenol 500 mg -Patient is satisfied with current therapy and denies issues -Recommended to continue current medication  Patient Goals/Self-Care Activities . Patient will:  - take medications as prescribed focus on medication adherence by routine target a minimum of 150 minutes of moderate intensity exercise weekly engage in dietary modifications by reducing carbs/sugars, increasing vegetables  Follow Up Plan: Telephone follow up appointment with care management team member scheduled for: 3 months      Ms. Blakeman was given information about Chronic Care Management services today including:  1. CCM service includes personalized support from designated clinical staff supervised by her physician, including individualized plan of care and  coordination with other care providers 2. 24/7 contact phone numbers for assistance for urgent and routine care needs. 3. Standard insurance, coinsurance, copays and deductibles apply for chronic care management only during months in which we provide at least 20 minutes of these services. Most insurances cover these services at 100%, however patients may be responsible for any copay, coinsurance and/or deductible if applicable. This service may help you avoid the need for more expensive face-to-face services. 4. Only one practitioner may furnish and bill the service in a calendar month. 5. The patient may stop CCM services at any time (effective at the end of the month) by phone call to the office staff.  Patient agreed to services and verbal consent obtained.   Patient verbalizes understanding of instructions provided today and agrees to view in [place].  Telephone follow up appointment with pharmacy team member scheduled for: 3 months  Charlene Brooke, PharmD, Para March, CPP Clinical Pharmacist [facility] [phone]  MyPlate from [place] is an outline of a general healthy diet based on the 2010 Dietary Guidelines for Americans, from the U.S. Department of Agriculture Scientist, research (physical sciences)). It sets guidelines for how much food you should eat from each food group based on your age, sex, and level  of physical activity. What are tips for following MyPlate? To follow MyPlate recommendations:  Eat a wide variety of fruits and vegetables, grains, and protein foods.  Serve smaller portions and eat less food throughout the day.  Limit portion sizes to avoid overeating.  Enjoy your food.  Get at least 150 minutes of exercise every week. This is about 30 minutes each day, 5 or more days per week. It can be difficult to have every meal look like MyPlate. Think about MyPlate as eating guidelines for an entire day, rather than each individual meal. Fruits and vegetables  Make  half of your plate fruits and vegetables.  Eat many different colors of fruits and vegetables each day.  For a 2,000 calorie daily food plan, eat: ? 2 cups of vegetables every day. ? 2 cups of fruit every day.  1 cup is equal to: ? 1 cup raw or cooked vegetables. ? 1 cup raw fruit. ? 1 medium-sized orange, apple, or banana. ? 1 cup 100% fruit or vegetable juice. ? 2 cups raw leafy greens, such as lettuce, spinach, or kale. ?  cup dried fruit. Grains  One fourth of your plate should be grains.  Make at least half of the grains you eat each day whole grains.  For a 2,000 calorie daily food plan, eat 6 oz of grains every day.  1 oz is equal to: ? 1 slice bread. ? 1 cup cereal. ?  cup cooked rice, cereal, or pasta. Protein  One fourth of your plate should be protein.  Eat a wide variety of protein foods, including meat, poultry, fish, eggs, beans, nuts, and tofu.  For a 2,000 calorie daily food plan, eat 5 oz of protein every day.  1 oz is equal to: ? 1 oz meat, poultry, or fish. ?  cup cooked beans. ? 1 egg. ?  oz nuts or seeds. ? 1 Tbsp peanut butter. Dairy  Drink fat-free or low-fat (1%) milk.  Eat or drink dairy as a side to meals.  For a 2,000 calorie daily food plan, eat or drink 3 cups of dairy every day.  1 cup is equal to: ? 1 cup milk, yogurt, cottage cheese, or soy milk (soy beverage). ? 2 oz processed cheese. ? 1 oz natural cheese. Fats, oils, salt, and sugars  Only small amounts of oils are recommended.  Avoid foods that are high in calories and low in nutritional value (empty calories), like foods high in fat or added sugars.  Choose foods that are low in salt (sodium). Choose foods that have less than 140 milligrams (mg) of sodium per serving.  Drink water instead of sugary drinks. Drink enough water each day to keep your urine pale yellow. Where to find support  Work with your health care provider or a nutrition specialist (dietitian)  to develop a customized eating plan that is right for you.  Download an app (mobile application) to help you track your daily food intake. Where to find more information  Go to [email or web address] for more information. Summary  MyPlate is a general guideline for healthy eating from the USDA. It is based on the 2010 Dietary Guidelines for Americans.  In general, fruits and vegetables should take up  of your plate, grains should take up  of your plate, and protein should take up  of your plate. This information is not intended to replace advice given to you by your health care provider. Make sure you discuss any questions you have with  your health care provider. Document Revised: 08/07/2018 Document Reviewed: 06/04/2016 Elsevier Patient Education  [place].

## 2020-07-12 ENCOUNTER — Other Ambulatory Visit: Payer: Self-pay | Admitting: Internal Medicine

## 2020-07-12 DIAGNOSIS — I10 Essential (primary) hypertension: Secondary | ICD-10-CM

## 2020-07-18 ENCOUNTER — Other Ambulatory Visit: Payer: Self-pay | Admitting: Internal Medicine

## 2020-07-18 DIAGNOSIS — F411 Generalized anxiety disorder: Secondary | ICD-10-CM

## 2020-07-21 ENCOUNTER — Telehealth: Payer: Self-pay | Admitting: Pharmacist

## 2020-07-21 NOTE — Progress Notes (Signed)
    Chronic Care Management Pharmacy Assistant   Name: Stephanie Pham  MRN: [id number] DOB: 11-08-1950  Reason for Encounter: Disease State - General Adherence  Recent office visits:   None noted  Recent consult visits:  None noted  Hospital visits:  None in previous 6 months  Medications: Outpatient Encounter Medications as of 07/21/2020  Medication Sig  . acetaminophen (TYLENOL) 500 MG tablet Take 500 mg by mouth every 6 (six) hours as needed.  Marland Kitchen aspirin 81 MG EC tablet   . atenolol (TENORMIN) 50 MG tablet TAKE 1 TABLET BY MOUTH  DAILY  . Cholecalciferol 50 MCG (2000 UT) TABS Take 1 tablet (2,000 Units total) by mouth daily.  . clorazepate (TRANXENE) 7.5 MG tablet TAKE 1 TABLET BY MOUTH 4  TIMES DAILY AS NEEDED FOR  ANXIETY  . fosinopril-hydrochlorothiazide (MONOPRIL-HCT) 20-12.5 MG tablet TAKE 1 TABLET BY MOUTH  DAILY  . Krill Oil 300 MG CAPS Take 900 mg by mouth.   . Multiple Vitamins-Minerals (ICAPS AREDS 2 PO) Take 1 capsule by mouth 2 (two) times daily.  . simvastatin (ZOCOR) 20 MG tablet TAKE 1 TABLET BY MOUTH  DAILY AT 6 PM   No facility-administered encounter medications on file as of 07/21/2020.    Have you had any problems recently with your health? Patient states no problems at this time.  Have you had any problems with your pharmacy? Patient states no problems with pharmacy.   What issues or side effects are you having with your medications? Patient states no issues at this time.   What would you like me to pass along to [facility] for them to help you with?  Patient states she's trying to eat healthier and hasn't started any exercising. Patient states she has been doing some work in her kitchen until she gets tire.   What can we do to take care of you better? Patient states nothing at this time.    Star Rating Drugs: Simvastatin - last fill 07/18/20 90D Fosinopril-hydrochlorothiazide - last fill 07/18/20 [place], RMA Clinical  Pharmacists Assistant [phone]  Time Spent: 67

## 2020-09-27 ENCOUNTER — Other Ambulatory Visit: Payer: Self-pay

## 2020-09-27 ENCOUNTER — Ambulatory Visit (INDEPENDENT_AMBULATORY_CARE_PROVIDER_SITE_OTHER): Payer: Medicare Other | Admitting: Pharmacist

## 2020-09-27 DIAGNOSIS — I10 Essential (primary) hypertension: Secondary | ICD-10-CM | POA: Diagnosis not present

## 2020-09-27 DIAGNOSIS — K760 Fatty (change of) liver, not elsewhere classified: Secondary | ICD-10-CM

## 2020-09-27 DIAGNOSIS — E785 Hyperlipidemia, unspecified: Secondary | ICD-10-CM | POA: Diagnosis not present

## 2020-09-27 DIAGNOSIS — F411 Generalized anxiety disorder: Secondary | ICD-10-CM

## 2020-09-27 NOTE — Progress Notes (Signed)
Chronic Care Management Pharmacy Note  09/27/2020 Name:  Stephanie Pham MRN:  [id number] DOB:  [birth year]  Summary: -Pt reports compliance with medications as prescribed -Pt has not started exercise bike -Trig consistently elevated >250 but < 500  Recommendations/Changes made from today's visit: -Advised to start using exercise bike - short amount of time is ok -Counseled on plate method/diet change -Counseled to get Shingrix vaccine at pharmacy, pt will think about it   Subjective: Stephanie Pham is an 70 y.o. year old female who is a primary patient of [name], MD.  The CCM team was consulted for assistance with disease management and care coordination needs.    Engaged with patient by telephone for follow up visit in response to provider referral for pharmacy case management and/or care coordination services.   Consent to Services:  The patient was given information about Chronic Care Management services, agreed to services, and gave verbal consent prior to initiation of services.  Please see initial visit note for detailed documentation.   Patient Care Team: [name], MD as PCP - General (Internal Medicine) Fay Records, MD as PCP - Cardiology (Cardiology) Druscilla Brownie, MD as Consulting Physician (Dermatology) Marygrace Drought, MD as Consulting Physician Charlton Haws, [facility] as Pharmacist (Pharmacist)   Patient lives at home with her husband. They are both retired and do not follow any specific diet or exercise routine. They order groceries online and pick up in front of store to avoid crowds in the store  Recent office visits: 06/13/20 Dr Ronnald Ramp OV: chronic f/u, labs stable, no med changes.  Recent consult visits: 02/01/20 Dr Harrington Challenger (cardiology): f/u HTN, dyspnea. TRIG 261, watch sugars/carbs.  Hospital visits: None in previous 6 months  Objective:  Lab Results  Component Value Date   CREATININE 0.71 06/13/2020   BUN 13 06/13/2020   GFR  86.09 06/13/2020   GFRNONAA 78.02 12/22/2008   GFRAA  01/11/2007    >60        The eGFR has been calculated using the MDRD equation. This calculation has not been validated in all clinical   NA 141 06/13/2020   K 3.6 06/13/2020   CALCIUM 9.7 06/13/2020   CO2 29 06/13/2020   GLUCOSE 119 (H) 06/13/2020    Lab Results  Component Value Date/Time   HGBA1C 5.6 06/13/2020 11:50 AM   HGBA1C 5.9 12/23/2019 09:26 AM   GFR 86.09 06/13/2020 11:50 AM   GFR 74.68 12/23/2019 09:26 AM    Last diabetic Eye exam: No results found for: HMDIABEYEEXA  Last diabetic Foot exam: No results found for: HMDIABFOOTEX   Lab Results  Component Value Date   CHOL 178 12/23/2019   HDL 47.90 12/23/2019   LDLDIRECT 92.0 12/23/2019   TRIG 261.0 (H) 12/23/2019   CHOLHDL 4 12/23/2019    Hepatic Function Latest Ref Rng & Units 06/13/2020 12/23/2019 08/06/2018  Total Protein 6.1 - 8.1 g/dL 7.9 7.9 8.0  Albumin 3.5 - 5.2 g/dL - 4.4 -  AST 0 - 37 U/L - 34 -  ALT 0 - 35 U/L - 39(H) -  Alk Phosphatase 39 - 117 U/L - 73 -  Total Bilirubin 0.2 - 1.2 mg/dL - 0.7 -  Bilirubin, Direct 0.0 - 0.3 mg/dL - 0.0 -    Lab Results  Component Value Date/Time   TSH 1.79 12/23/2019 09:26 AM   TSH 2.08 08/06/2018 08:38 AM    CBC Latest Ref Rng & Units 12/23/2019 08/06/2018 06/17/2017  WBC 4.0 -  10.5 K/uL 5.2 5.1 5.6  Hemoglobin 12.0 - 15.0 g/dL 13.1 13.3 13.4  Hematocrit 36.0 - 46.0 % 38.5 38.5 39.9  Platelets 150.0 - 400.0 K/uL 232.0 221.0 258.0    Lab Results  Component Value Date/Time   VD25OH 34.30 06/13/2020 11:50 AM   VD25OH 24.58 (L) 12/23/2019 09:26 AM    Clinical ASCVD: No  The 10-year ASCVD risk score Mikey Bussing DC Jr., et al., 2013) is: 14.4%   Values used to calculate the score:     Age: 70 years     Sex: Female     Is Non-Hispanic African American: No     Diabetic: No     Tobacco smoker: No     Systolic Blood Pressure: 818 mmHg     Is BP treated: Yes     HDL Cholesterol: 47.9 mg/dL     Total  Cholesterol: 178 mg/dL    Depression screen [facility] 2/9 12/08/2019 11/27/2018 11/22/2017  Decreased Interest 0 1 0  Down, Depressed, Hopeless 0 1 1  PHQ - 2 Score 0 2 1  Altered sleeping - 1 0  Tired, decreased energy - 1 1  Change in appetite - 0 1  Feeling bad or failure about yourself  - 0 0  Trouble concentrating - 0 0  Moving slowly or fidgety/restless - 0 0  Suicidal thoughts - 0 0  PHQ-9 Score - 4 3  Difficult doing work/chores - Not difficult at all Not difficult at all     Social History   Tobacco Use  Smoking Status Never  Smokeless Tobacco Never   BP Readings from Last 3 Encounters:  06/16/20 138/82  02/01/20 122/70  12/23/19 138/84   Pulse Readings from Last 3 Encounters:  06/16/20 65  02/01/20 69  12/23/19 66   Wt Readings from Last 3 Encounters:  06/16/20 240 lb (108.9 kg)  02/01/20 238 lb (108 kg)  12/23/19 241 lb (109.3 kg)   BMI Readings from Last 3 Encounters:  06/16/20 43.90 kg/m  02/01/20 43.53 kg/m  12/23/19 41.37 kg/m    Assessment/Interventions: Review of patient past medical history, allergies, medications, health status, including review of consultants reports, laboratory and other test data, was performed as part of comprehensive evaluation and provision of chronic care management services.   SDOH:  (Social Determinants of Health) assessments and interventions performed: Yes  SDOH Screenings   Alcohol Screen: Low Risk    Last Alcohol Screening Score (AUDIT): 0  Depression (PHQ2-9): Low Risk    PHQ-2 Score: 0  Financial Resource Strain: Low Risk    Difficulty of Paying Living Expenses: Not hard at all  Food Insecurity: No Food Insecurity   Worried About Charity fundraiser in the Last Year: Never true   Ran Out of Food in the Last Year: Never true  Housing: Low Risk    Last Housing Risk Score: 0  Physical Activity: Inactive   Days of Exercise per Week: 0 days   Minutes of Exercise per Session: 0 min  Social Connections: Engineer, maintenance of Communication with Friends and Family: More than three times a week   Frequency of Social Gatherings with Friends and Family: More than three times a week   Attends Religious Services: More than 4 times per year   Active Member of [employer] or Organizations: Yes   Attends Archivist Meetings: More than 4 times per year   Marital Status: Married  Stress: No Stress Concern Present   Feeling  of Stress : Not at all  Tobacco Use: Low Risk    Smoking Tobacco Use: Never   Smokeless Tobacco Use: Never  Transportation Needs: No Transportation Needs   Lack of Transportation (Medical): No   Lack of Transportation (Non-Medical): No    CCM Care Plan  Allergies  Allergen Reactions   Codeine    Iohexol      Desc: PT developed difficulty swallowing 5 min post injection of 150cc's Omni 300., Onset Date: [id number]    Ivp Dye [Iodinated Diagnostic Agents] Other (See Comments)    Couldn't swollow   Minocycline Other (See Comments)    DIZZY   Sulfonamide Derivatives Hives    Medications Reviewed Today     Reviewed by Charlton Haws, [facility] (Pharmacist) on 09/27/20 at 1558  Med List Status: <None>   Medication Order Taking? Sig Documenting Provider Last Dose Status Informant  acetaminophen (TYLENOL) 500 MG tablet [id number] Yes Take 500 mg by mouth every 6 (six) hours as needed. [provider] Taking Active   aspirin 81 MG EC tablet [id number] Yes  [provider] Taking Active   atenolol (TENORMIN) 50 MG tablet [id number] Yes TAKE 1 TABLET BY MOUTH  DAILY [name], MD Taking Active   Cholecalciferol 50 MCG (2000 UT) TABS [id number] Yes Take 1 tablet (2,000 Units total) by mouth daily. [name], MD Taking Active   clorazepate (TRANXENE) 7.5 MG tablet [id number] Yes TAKE 1 TABLET BY MOUTH 4  TIMES DAILY AS NEEDED FOR  ANXIETY [name], MD Taking Active   fosinopril-hydrochlorothiazide (MONOPRIL-HCT) 20-12.5 MG tablet [id number]  Yes TAKE 1 TABLET BY MOUTH  DAILY [name], MD Taking Active   Krill Oil 300 MG CAPS [id number] Yes Take 900 mg by mouth.  [provider] Taking Active Self  Multiple Vitamins-Minerals (ICAPS AREDS 2 PO) [id number] Yes Take 1 capsule by mouth 2 (two) times daily. [provider] Taking Active   simvastatin (ZOCOR) 20 MG tablet [id number] Yes TAKE 1 TABLET BY MOUTH  DAILY AT 6 PM Fay Records, MD Taking Active             Patient Active Problem List   Diagnosis Date Noted   Osteopenia 12/23/2017   OAB (overactive bladder) 08/28/2016   Hypertriglyceridemia 10/03/2015   Paraproteinemia 07/05/2015   Venous stasis dermatitis of both lower extremities 07/05/2015   Vitamin D deficiency 06/27/2015   Fatty liver disease, nonalcoholic 02/54/2706   Tremor 07/22/2013   Hyperglycemia 07/22/2013   GAD (generalized anxiety disorder) 07/22/2013   Routine health maintenance 02/09/2013   Obesity, Class III, BMI 40-49.9 (morbid obesity) ([place]) 02/09/2013   Senile osteoporosis 06/30/2009   Hyperlipidemia with target LDL less than 130 06/03/2007   Essential hypertension 12/19/2006    Immunization History  Administered Date(s) Administered   Fluad Quad(high Dose 65+) 11/27/2018, 12/08/2019   Influenza, High Dose Seasonal PF 12/27/2015, 11/21/2016, 12/23/2017   Influenza,inj,Quad PF,6+ Mos 02/09/2013, 11/17/2013, 12/22/2014   Influenza-Unspecified 12/08/2019   Pneumococcal Conjugate-13 12/28/2014   Pneumococcal Polysaccharide-23 12/27/2015   Tdap 02/09/2013   Tetanus 02/09/2013    Conditions to be addressed/monitored:  Hypertension, Hyperlipidemia, Anxiety and Osteopenia, Fatty Liver disease  Care Plan : [place]  Updates made by Charlton Haws, [place] since 09/27/2020 12:00 AM     Problem: Hypertension, Hyperlipidemia, Anxiety and Osteopenia, Fatty Liver disease   Priority: High     Long-Range Goal: Disease management   Start Date: 06/29/2020   Expected End  Date: 12/29/2020  Recent Progress: On track  Priority: High  Note:   Current Barriers:  Unable to independently monitor therapeutic efficacy Unable to maintain control of obesity / fatty liver disease  Pharmacist Clinical Goal(s):  Patient will achieve adherence to monitoring guidelines and medication adherence to achieve therapeutic efficacy maintain control of obesity/fatty liver disease as evidenced by weight loss  through collaboration with PharmD and provider.   Interventions: 1:1 collaboration with [name], MD regarding development and update of comprehensive plan of care as evidenced by provider attestation and co-signature Inter-disciplinary care team collaboration (see longitudinal plan of care) Comprehensive medication review performed; medication list updated in electronic medical record  Hypertension (BP goal <130/80) -Controlled - BP has been at goal in recent office visits; -Current treatment: Atenolol 50 mg daily (tremor) Fosinopril-HCTZ 20-12.5 mg daily -Current home readings: not checking -Current dietary habits: no special diet - eats bread, potatoes, chips  Lunch - hamburger helper, salad  Dinner - snacks (vanilla cream cookies), cabbage, broccoli  -Current exercise habits: none - does own exercise bike -Educated on BP goals and benefits of medications for prevention of heart attack, stroke and kidney damage; Daily salt intake goal < 2300 mg; Exercise goal of 150 minutes per week;  -Recommended to start using exercise bike - start small and increase as tolerated  Hyperlipidemia: (LDL goal < 100) -Not ideally controlled - LDL is at goal, however Triglycerides are elevated; pt reports taking Krill Oil for a few years, Trig have been 250-280 for the past 4 years. -Current treatment: Simvastatin 20 mg daily Aspirin 81 mg daily Krill Oil 300 mg (MegaRed) -Educated on Cholesterol goals; Benefits of statin for ASCVD risk reduction; Importance of  limiting foods high in cholesterol; Exercise goal of 150 minutes per week; -Recommended to continue current medication  Anxiety / Tremor (Goal: manage symptoms) -Controlled - taking clorazepate for over 40 years, pt reports symptoms are controlled; without medication she reports she has difficulty leaving her house; -Current treatment: Clorazepate 7.5 mg QID prn -PHQ9: 0 (11/2019) -GAD7: not on file -Connected with PCP for mental health support -Educated on potential side effects of clorazepate including tremor; pt does report tremor in R hand but currently manageable -Applied for tier exception - denied  Fatty Liver Disease (Goal: weight loss) -Not ideally controlled - pt was prescribed pioglitazone Oct 2021 but never started it  -Current treatment  No medications -Educated on fatty liver disease and benefits of weight loss -Counseled on diet and exercise extensively (plate method) - pt agrees to try diet changes and exercise bike  Health Maintenance -Vaccine gaps: covid vaccine, Shingrix -Pt reports she will never get the covid vaccine; she is interested in Shingrix vaccine but is very uncomfortable getting a vaccine at a pharmacy -Advised to get Shingrix vaccine at local pharmacy; pt will think about it  Patient Goals/Self-Care Activities Patient will:  - take medications as prescribed -focus on medication adherence by routine -target a minimum of 150 minutes of moderate intensity exercise weekly (exercise bike) - start with 1 min at a time -engage in dietary modifications by reducing carbs/sugars, increasing vegetables       Medication Assistance:  -Applied for tier exception for clorazepate given 40+ year use, currently controlled anxiety and history of withdrawal when taken off the drug. Tier exception was denied.  Compliance/Adherence/Medication fill history: Care Gaps: Covid vaccine (never) Shingrix  Star-Rating Drugs: Simvastatin - LF 07/18/20 x 90  ds Fosinopril-hctz - LF 07/18/20 x 90 ds  Patient's preferred pharmacy is:  [employer], television/film/video  [facility]) - [place], [address] [place] [address] Phone: [phone] Fax: [phone]  Uses pill box? No - prefers bottles Pt endorses 100% compliance  We discussed: Current pharmacy is preferred with insurance plan and patient is satisfied with pharmacy services Patient decided to: Continue current medication management strategy  Care Plan and Follow Up Patient Decision:  Patient agrees to Care Plan and Follow-up.  Plan: Telephone follow up appointment with care management team member scheduled for:  6 months  Charlene Brooke, PharmD, [place], CPP Clinical Pharmacist [facility] [phone]

## 2020-09-27 NOTE — Patient Instructions (Signed)
Visit Information  Phone number for Pharmacist: [phone]   Goals Addressed             This Visit's Progress    Manage My Medicine       Timeframe:  Long-Range Goal Priority:  Medium Start Date:     06/29/20                        Expected End Date:      01/15/21                 Follow Up Date 10/15/20   - call for medicine refill 2 or 3 days before it runs out - call if I am sick and can't take my medicine - keep a list of all the medicines I take; vitamins and herbals too  - Think about getting Shingrix vaccine at local pharmacy - Start using exercise bike - 1 min per day is better than nothing!   Why is this important?   These steps will help you keep on track with your medicines.   Notes:         Patient verbalizes understanding of instructions provided today and agrees to view in [place].  Telephone follow up appointment with pharmacy team member scheduled for: 6 months  Charlene Brooke, PharmD, [place], CPP Clinical Pharmacist [facility] [phone]

## 2020-10-20 ENCOUNTER — Telehealth: Payer: Self-pay | Admitting: Internal Medicine

## 2020-10-20 DIAGNOSIS — F411 Generalized anxiety disorder: Secondary | ICD-10-CM

## 2020-11-07 NOTE — Telephone Encounter (Signed)
[facility] is out of medication. Advised to contact her local pharmacy instead. [facility] them.   [facility]   [id number] [place] [place]:  [phone] Fax:  [phone]

## 2020-11-09 ENCOUNTER — Other Ambulatory Visit: Payer: Self-pay | Admitting: Internal Medicine

## 2020-11-09 DIAGNOSIS — F411 Generalized anxiety disorder: Secondary | ICD-10-CM

## 2020-11-09 MED ORDER — CLORAZEPATE DIPOTASSIUM 7.5 MG PO TABS: 7.5000 mg | ORAL_TABLET | Freq: Four times a day (QID) | ORAL | 1 refills | Status: DC | PRN

## 2020-11-09 NOTE — Telephone Encounter (Signed)
[facility], [facility] to [facility] sent to them instead of [facility] for Clorazepate.

## 2020-12-07 ENCOUNTER — Other Ambulatory Visit: Payer: Self-pay

## 2020-12-08 ENCOUNTER — Encounter: Payer: Self-pay | Admitting: Internal Medicine

## 2020-12-08 ENCOUNTER — Ambulatory Visit (INDEPENDENT_AMBULATORY_CARE_PROVIDER_SITE_OTHER): Payer: Medicare Other | Admitting: Internal Medicine

## 2020-12-08 VITALS — BP 136/84 | HR 66 | Temp 98.4°F | Resp 16 | Ht 62.0 in | Wt 226.0 lb

## 2020-12-08 DIAGNOSIS — I10 Essential (primary) hypertension: Secondary | ICD-10-CM | POA: Diagnosis not present

## 2020-12-08 DIAGNOSIS — E785 Hyperlipidemia, unspecified: Secondary | ICD-10-CM | POA: Diagnosis not present

## 2020-12-08 DIAGNOSIS — T502X5A Adverse effect of carbonic-anhydrase inhibitors, benzothiadiazides and other diuretics, initial encounter: Secondary | ICD-10-CM | POA: Insufficient documentation

## 2020-12-08 DIAGNOSIS — E876 Hypokalemia: Secondary | ICD-10-CM | POA: Diagnosis not present

## 2020-12-08 DIAGNOSIS — E781 Pure hyperglyceridemia: Secondary | ICD-10-CM

## 2020-12-08 DIAGNOSIS — Z23 Encounter for immunization: Secondary | ICD-10-CM

## 2020-12-08 LAB — LIPID PANEL
Cholesterol: 172 mg/dL (ref 0–200)
HDL: 49.5 mg/dL (ref >39.00)
NonHDL: 122.21
Total CHOL/HDL Ratio: 3
Triglycerides: 294 mg/dL — ABNORMAL HIGH (ref 0.0–149.0)
VLDL: 58.8 mg/dL — ABNORMAL HIGH (ref 0.0–40.0)

## 2020-12-08 LAB — HEPATIC FUNCTION PANEL
ALT: 18 U/L (ref 0–35)
AST: 20 U/L (ref 0–37)
Albumin: 4.4 g/dL (ref 3.5–5.2)
Alkaline Phosphatase: 73 U/L (ref 39–117)
Bilirubin, Direct: 0.1 mg/dL (ref 0.0–0.3)
Total Bilirubin: 0.7 mg/dL (ref 0.2–1.2)
Total Protein: 7.8 g/dL (ref 6.0–8.3)

## 2020-12-08 LAB — CBC WITH DIFFERENTIAL/PLATELET
Basophils Absolute: 0 10*3/uL (ref 0.0–0.1)
Basophils Relative: 0.5 % (ref 0.0–3.0)
Eosinophils Absolute: 0 10*3/uL (ref 0.0–0.7)
Eosinophils Relative: 0.8 % (ref 0.0–5.0)
HCT: 39.9 % (ref 36.0–46.0)
Hemoglobin: 13.4 g/dL (ref 12.0–15.0)
Lymphocytes Relative: 17.7 % (ref 12.0–46.0)
Lymphs Abs: 1 10*3/uL (ref 0.7–4.0)
MCHC: 33.6 g/dL (ref 30.0–36.0)
MCV: 93.3 fl (ref 78.0–100.0)
Monocytes Absolute: 0.3 10*3/uL (ref 0.1–1.0)
Monocytes Relative: 6.2 % (ref 3.0–12.0)
Neutro Abs: 4.2 10*3/uL (ref 1.4–7.7)
Neutrophils Relative %: 74.8 % (ref 43.0–77.0)
Platelets: 238 10*3/uL (ref 150.0–400.0)
RBC: 4.28 Mil/uL (ref 3.87–5.11)
RDW: 13 % (ref 11.5–15.5)
WBC: 5.7 10*3/uL (ref 4.0–10.5)

## 2020-12-08 LAB — BASIC METABOLIC PANEL
BUN: 11 mg/dL (ref 6–23)
CO2: 30 mEq/L (ref 19–32)
Calcium: 9.9 mg/dL (ref 8.4–10.5)
Chloride: 101 mEq/L (ref 96–112)
Creatinine, Ser: 0.75 mg/dL (ref 0.40–1.20)
GFR: 80.34 mL/min (ref >60.00)
Glucose, Bld: 122 mg/dL — ABNORMAL HIGH (ref 70–99)
Potassium: 3.3 mEq/L — ABNORMAL LOW (ref 3.5–5.1)
Sodium: 140 mEq/L (ref 135–145)

## 2020-12-08 LAB — LDL CHOLESTEROL, DIRECT: Direct LDL: 80 mg/dL

## 2020-12-08 MED ORDER — POTASSIUM CHLORIDE CRYS ER 15 MEQ PO TBCR: 15.0000 meq | EXTENDED_RELEASE_TABLET | Freq: Two times a day (BID) | ORAL | 0 refills | Status: DC

## 2020-12-08 NOTE — Progress Notes (Signed)
Subjective:  Patient ID: Stephanie Pham, female    DOB: 1951-03-04  Age: 70 y.o. MRN: [id number]  CC: Hypertension  This visit occurred during the SARS-CoV-2 public health emergency.  Safety protocols were in place, including screening questions prior to the visit, additional usage of staff PPE, and extensive cleaning of exam room while observing appropriate contact time as indicated for disinfecting solutions.    HPI Stephanie Pham presents for f/up -  She complains of chronic, unchanged DOE after climbing 2 flights of stairs.  She denies chest pain, diaphoresis, dizziness, lightheadedness, palpitations, or edema.  Outpatient Medications Prior to Visit  Medication Sig Dispense Refill   acetaminophen (TYLENOL) 500 MG tablet Take 500 mg by mouth every 6 (six) hours as needed.     aspirin 81 MG EC tablet  (Patient not taking: Reported on 12/09/2020)     atenolol (TENORMIN) 50 MG tablet TAKE 1 TABLET BY MOUTH  DAILY 90 tablet 1   Cholecalciferol 50 MCG (2000 UT) TABS Take 1 tablet (2,000 Units total) by mouth daily. (Patient taking differently: Take 2 tablets by mouth daily.) 90 tablet 1   clorazepate (TRANXENE) 7.5 MG tablet Take 1 tablet (7.5 mg total) by mouth 4 (four) times daily as needed for anxiety. 360 tablet 1   fosinopril-hydrochlorothiazide (MONOPRIL-HCT) 20-12.5 MG tablet TAKE 1 TABLET BY MOUTH  DAILY 90 tablet 1   Krill Oil 300 MG CAPS Take 1,000 mg by mouth daily.     Multiple Vitamins-Minerals (ICAPS AREDS 2 PO) Take 1 capsule by mouth 2 (two) times daily.     simvastatin (ZOCOR) 20 MG tablet TAKE 1 TABLET BY MOUTH  DAILY AT 6 PM 90 tablet 3   No facility-administered medications prior to visit.    ROS Review of Systems  Constitutional:  Negative for diaphoresis, fatigue and unexpected weight change.  HENT: Negative.    Eyes: Negative.   Respiratory:  Positive for shortness of breath. Negative for cough, chest tightness and wheezing.   Cardiovascular:  Negative for  chest pain, palpitations and leg swelling.  Gastrointestinal:  Negative for abdominal pain, constipation, diarrhea, nausea and vomiting.  Endocrine: Negative.   Genitourinary: Negative.  Negative for difficulty urinating.  Musculoskeletal: Negative.  Negative for arthralgias and myalgias.  Skin: Negative.   Neurological:  Negative for dizziness, weakness, light-headedness, numbness and headaches.  Hematological:  Negative for adenopathy. Does not bruise/bleed easily.  Psychiatric/Behavioral:  Negative for dysphoric mood and sleep disturbance. The patient is nervous/anxious.    Objective:  BP 136/84 (BP Location: Left Arm, Patient Position: Sitting, Cuff Size: Large)   Pulse 66   Temp 98.4 F (36.9 C) (Oral)   Resp 16   Ht 5\' 2"  (1.575 m)   Wt 226 lb (102.5 kg)   SpO2 98%   BMI 41.34 kg/m   BP Readings from Last 3 Encounters:  12/08/20 136/84  06/16/20 138/82  02/01/20 122/70    Wt Readings from Last 3 Encounters:  12/08/20 226 lb (102.5 kg)  06/16/20 240 lb (108.9 kg)  02/01/20 238 lb (108 kg)    Physical Exam Vitals reviewed.  Constitutional:      Appearance: She is obese.  HENT:     Nose: Nose normal.     Mouth/Throat:     Mouth: Mucous membranes are moist.  Eyes:     Conjunctiva/sclera: Conjunctivae normal.  Cardiovascular:     Rate and Rhythm: Normal rate and regular rhythm.     Heart sounds: No murmur  heard. Pulmonary:     Effort: Pulmonary effort is normal.     Breath sounds: No stridor. No wheezing, rhonchi or rales.  Abdominal:     General: Abdomen is protuberant. Bowel sounds are normal. There is no distension.     Palpations: Abdomen is soft. There is no hepatomegaly, splenomegaly or mass.     Tenderness: There is no abdominal tenderness.  Musculoskeletal:        General: Normal range of motion.     Cervical back: Neck supple.     Right lower leg: No edema.     Left lower leg: No edema.  Lymphadenopathy:     Cervical: No cervical adenopathy.   Skin:    General: Skin is warm and dry.     Coloration: Skin is not pale.  Neurological:     General: No focal deficit present.     Mental Status: She is alert.  Psychiatric:        Mood and Affect: Mood normal.        Behavior: Behavior normal.    Lab Results  Component Value Date   WBC 5.7 12/08/2020   HGB 13.4 12/08/2020   HCT 39.9 12/08/2020   PLT 238.0 12/08/2020   GLUCOSE 122 (H) 12/08/2020   CHOL 172 12/08/2020   TRIG 294.0 (H) 12/08/2020   HDL 49.50 12/08/2020   LDLDIRECT 80.0 12/08/2020   ALT 18 12/08/2020   AST 20 12/08/2020   NA 140 12/08/2020   K 3.3 (L) 12/08/2020   CL 101 12/08/2020   CREATININE 0.75 12/08/2020   BUN 11 12/08/2020   CO2 30 12/08/2020   TSH 1.79 12/23/2019   INR 1.1 (H) 12/23/2019   HGBA1C 5.6 06/13/2020    No results found.  Assessment & Plan:   [place] was seen today for hypertension.  Diagnoses and all orders for this visit:  Essential hypertension- Her BP is adequately well controlled. -     CBC with Differential/Platelet; Future -     Basic metabolic panel; Future -     Basic metabolic panel -     CBC with Differential/Platelet -     potassium chloride SA (KLOR-CON M15) 15 MEQ tablet; Take 1 tablet (15 mEq total) by mouth 2 (two) times daily.  Hyperlipidemia with target LDL less than 130- LDL goal achieved. Doing well on the statin  -     Lipid panel; Future -     Hepatic function panel; Future -     Hepatic function panel -     Lipid panel  Hypertriglyceridemia-she is working on her lifestyle modifications. -     Lipid panel; Future -     Lipid panel  Flu vaccine need -     Flu Vaccine QUAD High Dose(Fluad)  Diuretic-induced hypokalemia -     potassium chloride SA (KLOR-CON M15) 15 MEQ tablet; Take 1 tablet (15 mEq total) by mouth 2 (two) times daily.  Other orders -     LDL cholesterol, direct  I am having Judieth Keens start on potassium chloride SA. I am also having her maintain her Krill Oil,  acetaminophen, aspirin, Multiple Vitamins-Minerals (ICAPS AREDS 2 PO), Cholecalciferol, simvastatin, atenolol, fosinopril-hydrochlorothiazide, and clorazepate.  Meds ordered this encounter  Medications   potassium chloride SA (KLOR-CON M15) 15 MEQ tablet    Sig: Take 1 tablet (15 mEq total) by mouth 2 (two) times daily.    Dispense:  180 tablet    Refill:  0  Follow-up: Return in about 6 months (around 06/07/2021).  Scarlette Calico, MD

## 2020-12-08 NOTE — Patient Instructions (Signed)

## 2020-12-09 ENCOUNTER — Ambulatory Visit (INDEPENDENT_AMBULATORY_CARE_PROVIDER_SITE_OTHER): Payer: Medicare Other

## 2020-12-09 DIAGNOSIS — Z0000 Encounter for general adult medical examination without abnormal findings: Secondary | ICD-10-CM

## 2020-12-09 NOTE — Progress Notes (Signed)
I connected with Stephanie Pham today by telephone and verified that I am speaking with the correct person using two identifiers. Location patient: home Location provider: work Persons participating in the virtual visit: patient, provider.   I discussed the limitations, risks, security and privacy concerns of performing an evaluation and management service by telephone and the availability of in person appointments. I also discussed with the patient that there may be a patient responsible charge related to this service. The patient expressed understanding and verbally consented to this telephonic visit.    Interactive audio and video telecommunications were attempted between this provider and patient, however failed, due to patient having technical difficulties OR patient did not have access to video capability.  We continued and completed visit with audio only.  Some vital signs may be absent or patient reported.   Time Spent with patient on telephone encounter: 40 minutes  Subjective:   Stephanie Pham is a 70 y.o. female who presents for Medicare Annual (Subsequent) preventive examination.  Review of Systems     Cardiac Risk Factors include: advanced age (>33men, >28 women);dyslipidemia;family history of premature cardiovascular disease;hypertension;obesity (BMI >30kg/m2)     Objective:    There were no vitals filed for this visit. There is no height or weight on file to calculate BMI.  Advanced Directives 12/09/2020 12/08/2019 11/27/2018 11/22/2017 11/21/2016 11/13/2016 10/30/2016  Does Patient Have a Medical Advance Directive? No No No No No No No  Type of Advance Directive - - - - - - -  Does patient want to make changes to medical advance directive? - - - Yes (ED - Information included in AVS) - - -  Copy of [place] in Chart? - - - - - - -  Would patient like information on creating a medical advance directive? No - Patient declined No - Patient declined No -  Patient declined - Yes (ED - Information included in AVS) - -    Current Medications (verified) Outpatient Encounter Medications as of 12/09/2020  Medication Sig   acetaminophen (TYLENOL) 500 MG tablet Take 500 mg by mouth every 6 (six) hours as needed.   atenolol (TENORMIN) 50 MG tablet TAKE 1 TABLET BY MOUTH  DAILY   Cholecalciferol 50 MCG (2000 UT) TABS Take 1 tablet (2,000 Units total) by mouth daily. (Patient taking differently: Take 2 tablets by mouth daily.)   clorazepate (TRANXENE) 7.5 MG tablet Take 1 tablet (7.5 mg total) by mouth 4 (four) times daily as needed for anxiety.   fosinopril-hydrochlorothiazide (MONOPRIL-HCT) 20-12.5 MG tablet TAKE 1 TABLET BY MOUTH  DAILY   Krill Oil 300 MG CAPS Take 1,000 mg by mouth daily.   Multiple Vitamins-Minerals (ICAPS AREDS 2 PO) Take 1 capsule by mouth 2 (two) times daily.   potassium chloride SA (KLOR-CON M15) 15 MEQ tablet Take 1 tablet (15 mEq total) by mouth 2 (two) times daily.   simvastatin (ZOCOR) 20 MG tablet TAKE 1 TABLET BY MOUTH  DAILY AT 6 PM   aspirin 81 MG EC tablet  (Patient not taking: Reported on 12/09/2020)   No facility-administered encounter medications on file as of 12/09/2020.    Allergies (verified) Codeine, Iohexol, Ivp dye [iodinated diagnostic agents], Minocycline, and Sulfonamide derivatives   History: Past Medical History:  Diagnosis Date   Allergy    Anemia    Blood transfusion without reported diagnosis 1976   Cancer ([place])    colon cancer 1976   Cataract    both eyes-surgery  Chronic anxiety    Chronic depression    Diverticulosis of colon    patient said it may have been diverticulitis on cipro for a long period   Dyslipidemia    Hemorrhoids    Hyperlipidemia    Hypertension    Internal hemorrhoid    Macular degeneration    Osteoporosis    neck and hip osteopenia   Past Surgical History:  Procedure Laterality Date   ABDOMINAL HYSTERECTOMY     CATARACT EXTRACTION     left eye 05-13-2013    CHOLECYSTECTOMY     COLON SURGERY  2008   COLONOSCOPY     laparotomy and removal of polyp     sigmoid colectomy     Family History  Problem Relation Age of Onset   Fibromyalgia Mother    Hypertension Mother    Other Mother        "hallucinations" and fibromyalgia   Ovarian cysts Mother        "growths"   Heart disease Father    Diabetes Father    Heart attack Father 39       d. due to 2nd heart attack at 38   Breast cancer Maternal Aunt        dx 69s; s/p mastectomy   Other Maternal Aunt        hx of colon surgery in her 36s, lim info   COPD Brother    Heart attack Brother    Kidney disease Brother        smoker; d. 71   Bipolar disorder Brother    Skin cancer Brother        described as a "black mole"   Diabetes Brother    Endometriosis Daughter    Other Daughter        hx of breast biopsies   Diabetes Paternal Aunt    AAA (abdominal aortic aneurysm) Paternal Aunt    Stroke Maternal Grandfather    Other Maternal Grandfather        had an operation on his intestines to address constipation   Stroke Paternal Aunt    Stroke Paternal Aunt    Diabetes Paternal Uncle    Cancer Cousin        d. NOS cancer (maybe liver or lung cancer); +EtOH   Diabetes Paternal Grandmother    Other Paternal Grandmother        intestinal issues   Stroke Paternal Grandfather    Bipolar disorder Other    Colon cancer Neg Hx    Rectal cancer Neg Hx    Stomach cancer Neg Hx    Esophageal cancer Neg Hx    Colon polyps Neg Hx    Social History   Socioeconomic History   Marital status: Married    Spouse name: Not on file   Number of children: 3   Years of education: Not on file   Highest education level: Not on file  Occupational History   Occupation: Retired  Tobacco Use   Smoking status: Never   Smokeless tobacco: Never  Vaping Use   Vaping Use: Never used  Substance and Sexual Activity   Alcohol use: No    Alcohol/week: 0.0 standard drinks   Drug use: No   Sexual activity:  Not Currently  Other Topics Concern   Not on file  Social History Narrative   Married 1968   2 sons, 1 daughter lost one son in [place]   Full-time homemaker   Social Determinants of Health  Financial Resource Strain: Low Risk    Difficulty of Paying Living Expenses: Not hard at all  Food Insecurity: No Food Insecurity   Worried About Charity fundraiser in the Last Year: Never true   Ran Out of Food in the Last Year: Never true  Transportation Needs: No Transportation Needs   Lack of Transportation (Medical): No   Lack of Transportation (Non-Medical): No  Physical Activity: Inactive   Days of Exercise per Week: 0 days   Minutes of Exercise per Session: 0 min  Stress: No Stress Concern Present   Feeling of Stress : Not at all  Social Connections: Socially Integrated   Frequency of Communication with Friends and Family: More than three times a week   Frequency of Social Gatherings with Friends and Family: More than three times a week   Attends Religious Services: 1 to 4 times per year   Active Member of [employer] or Organizations: Yes   Attends Archivist Meetings: 1 to 4 times per year   Marital Status: Married    Tobacco Counseling Counseling given: Not Answered   Clinical Intake:  Pre-visit preparation completed: Yes  Pain : No/denies pain     BMI - recorded: 41.34 Nutritional Status: BMI > 30  Obese Nutritional Risks: None Diabetes: No  How often do you need to have someone help you when you read instructions, pamphlets, or other written materials from your doctor or pharmacy?: 1 - Never What is the last grade level you completed in school?: High School Graduate  Diabetic? no  Interpreter Needed?: No  Information entered by :: Lisette Abu, LPN   Activities of Daily Living In your present state of health, do you have any difficulty performing the following activities: 12/09/2020 12/08/2020  Hearing? N N  Vision? N N  Difficulty concentrating or  making decisions? N N  Walking or climbing stairs? N N  Dressing or bathing? N N  Doing errands, shopping? N N  Preparing Food and eating ? N -  Using the Toilet? N -  In the past six months, have you accidently leaked urine? Y -  Do you have problems with loss of bowel control? Y -  Managing your Medications? N -  Managing your Finances? N -  Housekeeping or managing your Housekeeping? N -  Some recent data might be hidden    Patient Care Team: [name], MD as PCP - General (Internal Medicine) Fay Records, MD as PCP - Cardiology (Cardiology) Druscilla Brownie, MD as Consulting Physician (Dermatology) Charlton Haws, [facility] as Pharmacist (Pharmacist) Marygrace Drought, MD as Consulting Physician (Ophthalmology)  Indicate any recent Medical Services you may have received from other than [facility] providers in the past year (date may be approximate).     Assessment:   This is a routine wellness examination for Stephanie Pham.  Hearing/Vision screen No results Pham.  Dietary issues and exercise activities discussed: Current Exercise Habits: The patient does not participate in regular exercise at present, Exercise limited by: None identified   Goals Addressed   None   Depression Screen PHQ 2/9 Scores 12/09/2020 12/08/2020 12/08/2019 11/27/2018 11/22/2017 11/21/2016 10/31/2015  PHQ - 2 Score 0 0 0 2 1 1  0  PHQ- 9 Score - - - 4 3 2  -    Fall Risk Fall Risk  12/09/2020 12/08/2020 12/08/2019 11/27/2018 11/22/2017  Falls in the past year? 0 0 0 1 Yes  Number falls in past yr: 0 - 0 0 1  Injury  with Fall? 0 - 0 0 No  Risk for fall due to : No Fall Risks - No Fall Risks Impaired balance/gait;Impaired mobility Impaired balance/gait;Impaired mobility  Risk for fall due to: Comment - - - - balance exericse print-out provided  Follow up Falls evaluation completed - Falls evaluation completed;Education provided Falls prevention discussed Education provided;Falls prevention discussed    FALL RISK  PREVENTION PERTAINING TO THE HOME:  Any stairs in or around the home? Yes  If so, are there any without handrails? No  Home free of loose throw rugs in walkways, pet beds, electrical cords, etc? Yes  Adequate lighting in your home to reduce risk of falls? Yes   ASSISTIVE DEVICES UTILIZED TO PREVENT FALLS:  Life alert? No  Use of a cane, walker or w/c? No  Grab bars in the bathroom? Yes  Shower chair or bench in shower? Yes  Elevated toilet seat or a handicapped toilet? No   TIMED UP AND GO:  Was the test performed? No .  Length of time to ambulate 10 feet: n/a sec.   Gait steady and fast without use of assistive device  Cognitive Function: Normal cognitive status assessed by direct observation by this Nurse Health Advisor. No abnormalities Pham.   MMSE - Mini Mental State Exam 10/31/2015  Not completed: (No Data)     6CIT Screen 12/08/2019  What Year? 0 points  What month? 0 points  What time? 3 points  Count back from 20 0 points  Months in reverse 0 points  Repeat phrase 0 points  Total Score 3    Immunizations Immunization History  Administered Date(s) Administered   Fluad Quad(high Dose 65+) 11/27/2018, 12/08/2019, 12/08/2020   Influenza, High Dose Seasonal PF 12/27/2015, 11/21/2016, 12/23/2017   Influenza,inj,Quad PF,6+ Mos 02/09/2013, 11/17/2013, 12/22/2014   Influenza-Unspecified 12/08/2019   Pneumococcal Conjugate-13 12/28/2014   Pneumococcal Polysaccharide-23 12/27/2015   Tdap 02/09/2013   Tetanus 02/09/2013    TDAP status: Up to date  Flu Vaccine status: Up to date  Pneumococcal vaccine status: Up to date  Covid-19 vaccine status: Declined, Education has been provided regarding the importance of this vaccine but patient still declined. Advised may receive this vaccine at local pharmacy or Health Dept.or vaccine clinic. Aware to provide a copy of the vaccination record if obtained from local pharmacy or Health Dept. Verbalized acceptance and  understanding.  Qualifies for Shingles Vaccine? Yes   Zostavax completed No   Shingrix Completed?: No.    Education has been provided regarding the importance of this vaccine. Patient has been advised to call insurance company to determine out of pocket expense if they have not yet received this vaccine. Advised may also receive vaccine at local pharmacy or Health Dept. Verbalized acceptance and understanding.  Screening Tests Health Maintenance  Topic Date Due   Zoster Vaccines- Shingrix (1 of 2) Never done   COVID-19 Vaccine (1) 12/24/2020 (Originally 06/10/1951)   COLONOSCOPY (Pts 45-56yrs Insurance coverage will need to be confirmed)  11/15/2021   MAMMOGRAM  12/06/2021   TETANUS/TDAP  02/10/2023   INFLUENZA VACCINE  Completed   DEXA SCAN  Completed   Hepatitis C Screening  Completed   HPV VACCINES  Aged Out    Health Maintenance  Health Maintenance Due  Topic Date Due   Zoster Vaccines- Shingrix (1 of 2) Never done    Colorectal cancer screening: Type of screening: Colonoscopy. Completed 11/15/2016. Repeat every 5 years  Mammogram status: Completed 12/07/2019. Repeat every year (scheduled for 12/12/2020)  Bone Density status: Completed 12/07/2019. Results reflect: Bone density results: OSTEOPENIA. Repeat every 2-3 years.  Lung Cancer Screening: (Low Dose CT Chest recommended if Age 1-80 years, 30 pack-year currently smoking OR have quit w/in 15years.) does not qualify.   Lung Cancer Screening Referral: no  Additional Screening:  Hepatitis C Screening: does qualify; Completed: yes  Vision Screening: Recommended annual ophthalmology exams for early detection of glaucoma and other disorders of the eye. Is the patient up to date with their annual eye exam?  Yes  Who is the provider or what is the name of the office in which the patient attends annual eye exams? Marygrace Drought, MD. If pt is not established with a provider, would they like to be referred to a provider to  establish care? No .   Dental Screening: Recommended annual dental exams for proper oral hygiene  Community Resource Referral / Chronic Care Management: CRR required this visit?  No   CCM required this visit?  No      Plan:     I have personally reviewed and noted the following in the patient's chart:   Medical and social history Use of alcohol, tobacco or illicit drugs  Current medications and supplements including opioid prescriptions.  Functional ability and status Nutritional status Physical activity Advanced directives List of other physicians Hospitalizations, surgeries, and ER visits in previous 12 months Vitals Screenings to include cognitive, depression, and falls Referrals and appointments  In addition, I have reviewed and discussed with patient certain preventive protocols, quality metrics, and best practice recommendations. A written personalized care plan for preventive services as well as general preventive health recommendations were provided to patient.     Sheral Flow, LPN   [id number]/4503   Nurse Notes:  Patient is cogitatively intact. There were no vitals filed for this visit. There is no height or weight on file to calculate BMI. Patient stated that she has no issues with gait or balance; does not use any assistive devices. Hearing Screening - Comments:: Patient denied any hearing difficulty.   No hearing aids.  Vision Screening - Comments:: Patient wears corrective glasses/contacts.  Eye exam done annually by: Marygrace Drought, MD.

## 2020-12-09 NOTE — Patient Instructions (Signed)
Stephanie Pham , Thank you for taking time to come for your Medicare Wellness Visit. I appreciate your ongoing commitment to your health goals. Please review the following plan we discussed and let me know if I can assist you in the future.   Screening recommendations/referrals: Colonoscopy: 11/15/2016; due every 5 years Mammogram: 12/07/2019; scheduled for 12/12/2020 Bone Density: 12/07/2019; due every 2-3 years Recommended yearly ophthalmology/optometry visit for glaucoma screening and checkup Recommended yearly dental visit for hygiene and checkup  Vaccinations: Influenza vaccine: 12/08/2020 Pneumococcal vaccine: 12/28/2014, 12/27/2015 Tdap vaccine: 02/09/2013 Shingles vaccine: never done   Covid-19: never done  Advanced directives: Advance directive discussed with you today. Even though you declined this today please call our office should you change your mind and we can give you the proper paperwork for you to fill out.   Conditions/risks identified: Yes; Client understands the importance of follow-up with providers by attending scheduled visits and discussed goals to eat healthier, increase physical activity, exercise the brain, socialize more, get enough sleep and make time for laughter.  Next appointment:  12/11/2021 at 10:40 am telephone visit with Health Coach.   Preventive Care 14 Years and Older, Female Preventive care refers to lifestyle choices and visits with your health care provider that can promote health and wellness. What does preventive care include? A yearly physical exam. This is also called an annual well check. Dental exams once or twice a year. Routine eye exams. Ask your health care provider how often you should have your eyes checked. Personal lifestyle choices, including: Daily care of your teeth and gums. Regular physical activity. Eating a healthy diet. Avoiding tobacco and drug use. Limiting alcohol use. Practicing safe sex. Taking low-dose aspirin every  day. Taking vitamin and mineral supplements as recommended by your health care provider. What happens during an annual well check? The services and screenings done by your health care provider during your annual well check will depend on your age, overall health, lifestyle risk factors, and family history of disease. Counseling  Your health care provider may ask you questions about your: Alcohol use. Tobacco use. Drug use. Emotional well-being. Home and relationship well-being. Sexual activity. Eating habits. History of falls. Memory and ability to understand (cognition). Work and work Statistician. Reproductive health. Screening  You may have the following tests or measurements: Height, weight, and BMI. Blood pressure. Lipid and cholesterol levels. These may be checked every 5 years, or more frequently if you are over 86 years old. Skin check. Lung cancer screening. You may have this screening every year starting at age 34 if you have a 30-pack-year history of smoking and currently smoke or have quit within the past 15 years. Fecal occult blood test (FOBT) of the stool. You may have this test every year starting at age 63. Flexible sigmoidoscopy or colonoscopy. You may have a sigmoidoscopy every 5 years or a colonoscopy every 10 years starting at age 57. Hepatitis C blood test. Hepatitis B blood test. Sexually transmitted disease (STD) testing. Diabetes screening. This is done by checking your blood sugar (glucose) after you have not eaten for a while (fasting). You may have this done every 1-3 years. Bone density scan. This is done to screen for osteoporosis. You may have this done starting at age 41. Mammogram. This may be done every 1-2 years. Talk to your health care provider about how often you should have regular mammograms. Talk with your health care provider about your test results, treatment options, and if necessary, the need for  more tests. Vaccines  Your health care  provider may recommend certain vaccines, such as: Influenza vaccine. This is recommended every year. Tetanus, diphtheria, and acellular pertussis (Tdap, Td) vaccine. You may need a Td booster every 10 years. Zoster vaccine. You may need this after age 62. Pneumococcal 13-valent conjugate (PCV13) vaccine. One dose is recommended after age 56. Pneumococcal polysaccharide (PPSV23) vaccine. One dose is recommended after age 44. Talk to your health care provider about which screenings and vaccines you need and how often you need them. This information is not intended to replace advice given to you by your health care provider. Make sure you discuss any questions you have with your health care provider. Document Released: 04/01/2015 Document Revised: 11/23/2015 Document Reviewed: 01/04/2015 Elsevier Interactive Patient Education  2017 [employer] Prevention in the Home Falls can cause injuries. They can happen to people of all ages. There are many things you can do to make your home safe and to help prevent falls. What can I do on the outside of my home? Regularly fix the edges of walkways and driveways and fix any cracks. Remove anything that might make you trip as you walk through a door, such as a raised step or threshold. Trim any bushes or trees on the path to your home. Use bright outdoor lighting. Clear any walking paths of anything that might make someone trip, such as rocks or tools. Regularly check to see if handrails are loose or broken. Make sure that both sides of any steps have handrails. Any raised decks and porches should have guardrails on the edges. Have any leaves, snow, or ice cleared regularly. Use sand or salt on walking paths during winter. Clean up any spills in your garage right away. This includes oil or grease spills. What can I do in the bathroom? Use night lights. Install grab bars by the toilet and in the tub and shower. Do not use towel bars as grab  bars. Use non-skid mats or decals in the tub or shower. If you need to sit down in the shower, use a plastic, non-slip stool. Keep the floor dry. Clean up any water that spills on the floor as soon as it happens. Remove soap buildup in the tub or shower regularly. Attach bath mats securely with double-sided non-slip rug tape. Do not have throw rugs and other things on the floor that can make you trip. What can I do in the bedroom? Use night lights. Make sure that you have a light by your bed that is easy to reach. Do not use any sheets or blankets that are too big for your bed. They should not hang down onto the floor. Have a firm chair that has side arms. You can use this for support while you get dressed. Do not have throw rugs and other things on the floor that can make you trip. What can I do in the kitchen? Clean up any spills right away. Avoid walking on wet floors. Keep items that you use a lot in easy-to-reach places. If you need to reach something above you, use a strong step stool that has a grab bar. Keep electrical cords out of the way. Do not use floor polish or wax that makes floors slippery. If you must use wax, use non-skid floor wax. Do not have throw rugs and other things on the floor that can make you trip. What can I do with my stairs? Do not leave any items on the stairs. Make  sure that there are handrails on both sides of the stairs and use them. Fix handrails that are broken or loose. Make sure that handrails are as long as the stairways. Check any carpeting to make sure that it is firmly attached to the stairs. Fix any carpet that is loose or worn. Avoid having throw rugs at the top or bottom of the stairs. If you do have throw rugs, attach them to the floor with carpet tape. Make sure that you have a light switch at the top of the stairs and the bottom of the stairs. If you do not have them, ask someone to add them for you. What else can I do to help prevent  falls? Wear shoes that: Do not have high heels. Have rubber bottoms. Are comfortable and fit you well. Are closed at the toe. Do not wear sandals. If you use a stepladder: Make sure that it is fully opened. Do not climb a closed stepladder. Make sure that both sides of the stepladder are locked into place. Ask someone to hold it for you, if possible. Clearly mark and make sure that you can see: Any grab bars or handrails. First and last steps. Where the edge of each step is. Use tools that help you move around (mobility aids) if they are needed. These include: Canes. Walkers. Scooters. Crutches. Turn on the lights when you go into a dark area. Replace any light bulbs as soon as they burn out. Set up your furniture so you have a clear path. Avoid moving your furniture around. If any of your floors are uneven, fix them. If there are any pets around you, be aware of where they are. Review your medicines with your doctor. Some medicines can make you feel dizzy. This can increase your chance of falling. Ask your doctor what other things that you can do to help prevent falls. This information is not intended to replace advice given to you by your health care provider. Make sure you discuss any questions you have with your health care provider. Document Released: 12/30/2008 Document Revised: 08/11/2015 Document Reviewed: 04/09/2014 Elsevier Interactive Patient Education  2017 [employer].

## 2020-12-12 ENCOUNTER — Other Ambulatory Visit: Payer: Self-pay | Admitting: Internal Medicine

## 2020-12-12 DIAGNOSIS — Z1231 Encounter for screening mammogram for malignant neoplasm of breast: Secondary | ICD-10-CM | POA: Diagnosis not present

## 2020-12-12 DIAGNOSIS — I10 Essential (primary) hypertension: Secondary | ICD-10-CM

## 2020-12-12 DIAGNOSIS — E876 Hypokalemia: Secondary | ICD-10-CM

## 2020-12-12 LAB — HM MAMMOGRAPHY

## 2020-12-18 ENCOUNTER — Other Ambulatory Visit: Payer: Self-pay | Admitting: Internal Medicine

## 2020-12-18 DIAGNOSIS — I10 Essential (primary) hypertension: Secondary | ICD-10-CM

## 2021-02-28 ENCOUNTER — Other Ambulatory Visit: Payer: Self-pay | Admitting: Internal Medicine

## 2021-03-29 ENCOUNTER — Telehealth: Payer: Medicare Other

## 2021-04-28 ENCOUNTER — Other Ambulatory Visit: Payer: Self-pay | Admitting: Internal Medicine

## 2021-04-28 DIAGNOSIS — I10 Essential (primary) hypertension: Secondary | ICD-10-CM

## 2021-05-02 ENCOUNTER — Other Ambulatory Visit: Payer: Self-pay | Admitting: Internal Medicine

## 2021-05-02 DIAGNOSIS — F411 Generalized anxiety disorder: Secondary | ICD-10-CM

## 2021-05-03 ENCOUNTER — Other Ambulatory Visit: Payer: Self-pay | Admitting: Internal Medicine

## 2021-05-03 ENCOUNTER — Encounter: Payer: Self-pay | Admitting: Internal Medicine

## 2021-05-03 ENCOUNTER — Ambulatory Visit: Payer: Medicare Other | Admitting: Internal Medicine

## 2021-05-03 ENCOUNTER — Other Ambulatory Visit: Payer: Self-pay

## 2021-05-03 VITALS — BP 134/77 | HR 65 | Ht 62.0 in | Wt 215.0 lb

## 2021-05-03 DIAGNOSIS — F411 Generalized anxiety disorder: Secondary | ICD-10-CM

## 2021-05-03 DIAGNOSIS — Z79899 Other long term (current) drug therapy: Secondary | ICD-10-CM | POA: Diagnosis not present

## 2021-05-03 DIAGNOSIS — I10 Essential (primary) hypertension: Secondary | ICD-10-CM

## 2021-05-03 DIAGNOSIS — E782 Mixed hyperlipidemia: Secondary | ICD-10-CM | POA: Diagnosis not present

## 2021-05-03 MED ORDER — CLORAZEPATE DIPOTASSIUM 7.5 MG PO TABS: ORAL_TABLET | ORAL | 0 refills | Status: DC

## 2021-05-03 NOTE — Patient Instructions (Signed)
Medication Instructions:  Your physician recommends that you continue on your current medications as directed. Please refer to the Current Medication list given to you today.  *If you need a refill on your cardiac medications before your next appointment, please call your pharmacy*   Lab Work: Lipid, vit d, HGBa1c, uric acid... order given to pt If you have labs (blood work) drawn today and your tests are completely normal, you will receive your results only by: [place] (if you have MyChart) OR A paper copy in the mail If you have any lab test that is abnormal or we need to change your treatment, we will call you to review the results.   Testing/Procedures: none   Follow-Up: At [facility], you and your health needs are our priority.  As part of our continuing mission to provide you with exceptional heart care, we have created designated Provider Care Teams.  These Care Teams include your primary Cardiologist (physician) and Advanced Practice Providers (APPs -  Physician Assistants and Nurse Practitioners) who all work together to provide you with the care you need, when you need it.  We recommend signing up for the patient portal called "MyChart".  Sign up information is provided on this After Visit Summary.  MyChart is used to connect with patients for Virtual Visits (Telemedicine).  Patients are able to view lab/test results, encounter notes, upcoming appointments, etc.  Non-urgent messages can be sent to your provider as well.   To learn more about what you can do with MyChart, go to [email or web address].    Your next appointment:   8 month(s)  The format for your next appointment:   In Person  Provider:   Dorris Carnes, MD     Other Instructions

## 2021-05-03 NOTE — Progress Notes (Signed)
Cardiology Office Note   Date:  05/03/2021   ID:  Stephanie Pham, DOB [birth year], MRN [id number]  PCP:  [name], MD  Cardiologist:   Dorris Carnes, MD    Pt presents for f/u of HTN and SOB   History of Present Illness: Stephanie Pham is a 71 y.o. female with a history of HTN and dyspea I saw her in Nov 2021     Since seen her breathing is relatively stable   Not SOB  Does note occasional fluttering in chest   Very short lived     BP at home 120s /   Outpatient Medications Prior to Visit  Medication Sig Dispense Refill   acetaminophen (TYLENOL) 500 MG tablet Take 500 mg by mouth every 6 (six) hours as needed.     atenolol (TENORMIN) 50 MG tablet TAKE 1 TABLET BY MOUTH  DAILY 90 tablet 0   Cholecalciferol (VITAMIN D3) 50 MCG (2000 UT) CAPS Take 2,000 Units by mouth in the morning and at bedtime.     Cholecalciferol 50 MCG (2000 UT) TABS Take 1 tablet (2,000 Units total) by mouth daily. (Patient taking differently: Take 2 tablets by mouth daily.) 90 tablet 1   clorazepate (TRANXENE) 7.5 MG tablet TAKE 1 TABLET BY MOUTH 4 TIMES DAILY AS NEEDED FOR ANXIETY. 360 tablet 0   fosinopril-hydrochlorothiazide (MONOPRIL-HCT) 20-12.5 MG tablet TAKE 1 TABLET BY MOUTH  DAILY 90 tablet 0   Krill Oil 300 MG CAPS Take 1,000 mg by mouth daily.     Multiple Vitamins-Minerals (ICAPS AREDS 2 PO) Take 1 capsule by mouth 2 (two) times daily.     potassium chloride SA (KLOR-CON M15) 15 MEQ tablet TAKE 1 TABLET (15 MEQ TOTAL) BY MOUTH 2 (TWO) TIMES DAILY. 180 tablet 0   simvastatin (ZOCOR) 20 MG tablet TAKE 1 TABLET BY MOUTH  DAILY AT 6 PM 90 tablet 0   aspirin 81 MG EC tablet  (Patient not taking: Reported on 12/09/2020)     No facility-administered medications prior to visit.     Allergies:   Codeine, Iohexol, Ivp dye [iodinated contrast media], Minocycline, and Sulfonamide derivatives   Past Medical History:  Diagnosis Date   Allergy    Anemia    Blood transfusion without reported  diagnosis 1976   Cancer ([place])    colon cancer 1976   Cataract    both eyes-surgery   Chronic anxiety    Chronic depression    Diverticulosis of colon    patient said it may have been diverticulitis on cipro for a long period   Dyslipidemia    Hemorrhoids    Hyperlipidemia    Hypertension    Internal hemorrhoid    Macular degeneration    Osteoporosis    neck and hip osteopenia    Past Surgical History:  Procedure Laterality Date   ABDOMINAL HYSTERECTOMY     CATARACT EXTRACTION     left eye 05-13-2013   CHOLECYSTECTOMY     COLON SURGERY  2008   COLONOSCOPY     laparotomy and removal of polyp     sigmoid colectomy       Social History:  The patient  reports that she has never smoked. She has never used smokeless tobacco. She reports that she does not drink alcohol and does not use drugs.   Family History:  The patient's family history includes AAA (abdominal aortic aneurysm) in her paternal aunt; Bipolar disorder in her brother and another family member; Breast  cancer in her maternal aunt; COPD in her brother; Cancer in her cousin; Diabetes in her brother, father, paternal aunt, paternal grandmother, and paternal uncle; Endometriosis in her daughter; Fibromyalgia in her mother; Heart attack in her brother; Heart attack (age of onset: 67) in her father; Heart disease in her father; Hypertension in her mother; Kidney disease in her brother; Other in her daughter, maternal aunt, maternal grandfather, mother, and paternal grandmother; Ovarian cysts in her mother; Skin cancer in her brother; Stroke in her maternal grandfather, paternal aunt, paternal aunt, and paternal grandfather.    ROS:  Please see the history of present illness. All other systems are reviewed and  Negative to the above problem except as noted.    PHYSICAL EXAM: VS:  BP 134/77    Pulse 65    Ht 5\' 2"  (1.575 m)    Wt 215 lb (97.5 kg)    BMI 39.32 kg/m   GEN: Morbidly obese 67  , in no acute distress  HEENT:  normal  Neck: Neck is full   No carotid bruits   Cardiac: RRR; no murmurs Triv LE  dema  Respiratory:  clear to auscultation bilaterally, normal work of breathing GI: soft, Obese  Nontender  MS: no deformity Moving all extremities   Skin: warm   very dry and scaling persists Neuro:  Strength and sensation are intact Psych: euthymic mood, full affect   EKG:  EKG is done today  NSR   65 bpm     Lipid Panel    Component Value Date/Time   CHOL 172 12/08/2020 1005   TRIG 294.0 (H) 12/08/2020 1005   HDL 49.50 12/08/2020 1005   CHOLHDL 3 12/08/2020 1005   VLDL 58.8 (H) 12/08/2020 1005   LDLDIRECT 80.0 12/08/2020 1005      Wt Readings from Last 3 Encounters:  05/03/21 215 lb (97.5 kg)  12/08/20 226 lb (102.5 kg)  06/16/20 240 lb (108.9 kg)      ASSESSMENT AND PLAN:  1  Dyspnea: Breathing remains stable.  Overall volume status is not bad     2.  Hypertension:  Continue on current meds  3.  Dyslipidemia.  LDL not calculated  HDL 49  Trig 294   Watch carbs, diet      4  Morbid obesity COunselled on diet     Pt given slip to have labs drawn by PCP   She does not want today  ( Hgb A1C, lipids, Vit D, uric Acid)       Current medicines are reviewed at length with the patient today.  The patient does not have concerns regarding medicines.  Signed, Dorris Carnes, MD  05/03/2021 9:39 AM    [address], [place], [place]  [address] Phone: [phone]; Fax: [phone]

## 2021-05-06 ENCOUNTER — Other Ambulatory Visit: Payer: Self-pay | Admitting: Internal Medicine

## 2021-05-06 DIAGNOSIS — F411 Generalized anxiety disorder: Secondary | ICD-10-CM

## 2021-05-12 ENCOUNTER — Ambulatory Visit (INDEPENDENT_AMBULATORY_CARE_PROVIDER_SITE_OTHER): Payer: Medicare Other

## 2021-05-12 DIAGNOSIS — I10 Essential (primary) hypertension: Secondary | ICD-10-CM

## 2021-05-12 DIAGNOSIS — E781 Pure hyperglyceridemia: Secondary | ICD-10-CM

## 2021-05-12 DIAGNOSIS — E785 Hyperlipidemia, unspecified: Secondary | ICD-10-CM

## 2021-05-12 NOTE — Patient Instructions (Signed)
Visit Information  Following are the goals we discussed today:   Manage My Medicine   Timeframe:  Long-Range Goal Priority:  Medium Start Date:     06/29/20                        Expected End Date:    05/12/2022               Follow Up Date 11/09/2021   - call for medicine refill 2 or 3 days before it runs out - call if I am sick and can't take my medicine - keep a list of all the medicines I take; vitamins and herbals too  - Start using exercise bike - 1 min per day is better than nothing!   Why is this important?   These steps will help you keep on track with your medicines.  Plan: Telephone follow up appointment with care management team member scheduled for:  6 months The patient has been provided with contact information for the care management team and has been advised to call with any health related questions or concerns.   Tomasa Blase, PharmD Clinical Pharmacist, [facility]   Please call the care guide team at [phone] if you need to cancel or reschedule your appointment.   Patient verbalizes understanding of instructions and care plan provided today and agrees to view in [place]. Active MyChart status confirmed with patient.

## 2021-05-12 NOTE — Progress Notes (Signed)
Chronic Care Management Pharmacy Note  05/12/2021 Name:  Stephanie Pham MRN:  [id number] DOB:  1950-12-04  Summary: -Patient reports that she has been taking potassium only once daily since it had been prescribed - has been eating more potassium containing foods  -Reports compliance to her other medications  -BP controlled at home (checked by home nurse) -Notes that she has not been using exercise bike / has not increased activity as discussed  -Complains of mild constipation - at times can irritate hemorrhoids - notes that she has been using miralax as needed   Recommendations/Changes made from today's visit: -Advised to start using exercise bike - short amount of time is ok -Recommended  for patient to use docusate 141m up to BID prn to help relieve constipation / prevent irritation to hemorrhoids  -recommending for patient to have potassium rechecked at next PCP appointment, notes she does not like to have labwork done - this is why she did not recheck 3 months after starting her potassium as direct -reviewed with patient importance of compliance to medications and testing -Patient to reach out should she have any issues or concerns regarding her medications   Subjective: BSHERELL CHRISTOFFELis an 71y.o. year old female who is a primary patient of JJanith Lima MD.  The CCM team was consulted for assistance with disease management and care coordination needs.    Engaged with patient by telephone for follow up visit in response to provider referral for pharmacy case management and/or care coordination services.   Consent to Services:  The patient was given information about Chronic Care Management services, agreed to services, and gave verbal consent prior to initiation of services.  Please see initial visit note for detailed documentation.   Patient Care Team: JJanith Lima MD as PCP - General (Internal Medicine) RFay Records MD as PCP - Cardiology (Cardiology) LDruscilla Brownie MD as Consulting Physician (Dermatology) TMarygrace Drought MD as Consulting Physician (Ophthalmology) SDelice BisonDDarnelle Maffucci [facility](Pharmacist)   Patient lives at home with her husband. They are both retired and do not follow any specific diet or exercise routine. They order groceries online and pick up in front of store to avoid crowds in the store  Recent office visits: 12/08/2020 - Dr. JRonnald Ramp- potassium chloride 15 mEq BID   Recent consult visits: 05/03/2021 - Dr. RHarrington Challenger - Cardiology - no changes to medications - f/u in 8 months   Hospital visits: None in previous 6 months  Objective:  Lab Results  Component Value Date   CREATININE 0.75 12/08/2020   BUN 11 12/08/2020   GFR 80.34 12/08/2020   GFRNONAA 78.02 12/22/2008   GFRAA  01/11/2007    >60        The eGFR has been calculated using the MDRD equation. This calculation has not been validated in all clinical   NA 140 12/08/2020   K 3.3 (L) 12/08/2020   CALCIUM 9.9 12/08/2020   CO2 30 12/08/2020   GLUCOSE 122 (H) 12/08/2020    Lab Results  Component Value Date/Time   HGBA1C 5.6 06/13/2020 11:50 AM   HGBA1C 5.9 12/23/2019 09:26 AM   GFR 80.34 12/08/2020 10:05 AM   GFR 86.09 06/13/2020 11:50 AM    Last diabetic Eye exam: No results found for: HMDIABEYEEXA  Last diabetic Foot exam: No results found for: HMDIABFOOTEX   Lab Results  Component Value Date   CHOL 172 12/08/2020   HDL 49.50 12/08/2020   LDLDIRECT 80.0  12/08/2020   TRIG 294.0 (H) 12/08/2020   CHOLHDL 3 12/08/2020    Hepatic Function Latest Ref Rng & Units 12/08/2020 06/13/2020 12/23/2019  Total Protein 6.0 - 8.3 g/dL 7.8 7.9 7.9  Albumin 3.5 - 5.2 g/dL 4.4 - 4.4  AST 0 - 37 U/L 20 - 34  ALT 0 - 35 U/L 18 - 39(H)  Alk Phosphatase 39 - 117 U/L 73 - 73  Total Bilirubin 0.2 - 1.2 mg/dL 0.7 - 0.7  Bilirubin, Direct 0.0 - 0.3 mg/dL 0.1 - 0.0    Lab Results  Component Value Date/Time   TSH 1.79 12/23/2019 09:26 AM   TSH 2.08 08/06/2018 08:38 AM     CBC Latest Ref Rng & Units 12/08/2020 12/23/2019 08/06/2018  WBC 4.0 - 10.5 K/uL 5.7 5.2 5.1  Hemoglobin 12.0 - 15.0 g/dL 13.4 13.1 13.3  Hematocrit 36.0 - 46.0 % 39.9 38.5 38.5  Platelets 150.0 - 400.0 K/uL 238.0 232.0 221.0    Lab Results  Component Value Date/Time   VD25OH 34.30 06/13/2020 11:50 AM   VD25OH 24.58 (L) 12/23/2019 09:26 AM    Clinical ASCVD: No  The 10-year ASCVD risk score (Arnett DK, et al., 2019) is: 14.9%   Values used to calculate the score:     Age: 49 years     Sex: Female     Is Non-Hispanic African American: No     Diabetic: No     Tobacco smoker: No     Systolic Blood Pressure: 947 mmHg     Is BP treated: Yes     HDL Cholesterol: 49.5 mg/dL     Total Cholesterol: 172 mg/dL    Depression screen [facility] 2/9 12/09/2020 12/08/2020 12/08/2019  Decreased Interest 0 0 0  Down, Depressed, Hopeless 0 0 0  PHQ - 2 Score 0 0 0  Altered sleeping - - -  Tired, decreased energy - - -  Change in appetite - - -  Feeling bad or failure about yourself  - - -  Trouble concentrating - - -  Moving slowly or fidgety/restless - - -  Suicidal thoughts - - -  PHQ-9 Score - - -  Difficult doing work/chores - - -     Social History   Tobacco Use  Smoking Status Never  Smokeless Tobacco Never   BP Readings from Last 3 Encounters:  05/03/21 134/77  12/08/20 136/84  06/16/20 138/82   Pulse Readings from Last 3 Encounters:  05/03/21 65  12/08/20 66  06/16/20 65   Wt Readings from Last 3 Encounters:  05/03/21 215 lb (97.5 kg)  12/08/20 226 lb (102.5 kg)  06/16/20 240 lb (108.9 kg)   BMI Readings from Last 3 Encounters:  05/03/21 39.32 kg/m  12/08/20 41.34 kg/m  06/16/20 43.90 kg/m    Assessment/Interventions: Review of patient past medical history, allergies, medications, health status, including review of consultants reports, laboratory and other test data, was performed as part of comprehensive evaluation and provision of chronic care management  services.   SDOH:  (Social Determinants of Health) assessments and interventions performed: Yes  SDOH Screenings   Alcohol Screen: Low Risk    Last Alcohol Screening Score (AUDIT): 0  Depression (PHQ2-9): Low Risk    PHQ-2 Score: 0  Financial Resource Strain: Low Risk    Difficulty of Paying Living Expenses: Not hard at all  Food Insecurity: No Food Insecurity   Worried About Charity fundraiser in the Last Year: Never true   Ran Out of  Food in the Last Year: Never true  Housing: Low Risk    Last Housing Risk Score: 0  Physical Activity: Inactive   Days of Exercise per Week: 0 days   Minutes of Exercise per Session: 0 min  Social Connections: Socially Integrated   Frequency of Communication with Friends and Family: More than three times a week   Frequency of Social Gatherings with Friends and Family: More than three times a week   Attends Religious Services: 1 to 4 times per year   Active Member of [employer] or Organizations: Yes   Attends Archivist Meetings: 1 to 4 times per year   Marital Status: Married  Stress: No Stress Concern Present   Feeling of Stress : Not at all  Tobacco Use: Low Risk    Smoking Tobacco Use: Never   Smokeless Tobacco Use: Never   Passive Exposure: Not on file  Transportation Needs: No Transportation Needs   Lack of Transportation (Medical): No   Lack of Transportation (Non-Medical): No    CCM Care Plan  Allergies  Allergen Reactions   Codeine    Iohexol      Desc: PT developed difficulty swallowing 5 min post injection of 150cc's Omni 300., Onset Date: [id number]    Ivp Dye [Iodinated Contrast Media] Other (See Comments)    Couldn't swollow   Minocycline Other (See Comments)    DIZZY   Sulfonamide Derivatives Hives    Medications Reviewed Today     Reviewed by Tomasa Blase, [facility] (Pharmacist) on 05/12/21 at 1144  Med List Status: <None>   Medication Order Taking? Sig Documenting Provider Last Dose Status Informant   acetaminophen (TYLENOL) 500 MG tablet [id number] Yes Take 250 mg by mouth 3 (three) times daily as needed. [provider] Taking Active   atenolol (TENORMIN) 50 MG tablet [id number] Yes TAKE 1 TABLET BY MOUTH  DAILY [name], MD Taking Active   Cholecalciferol (VITAMIN D3) 50 MCG (2000 UT) CAPS [id number] Yes Take 2,000 Units by mouth in the morning and at bedtime. [provider] Taking Active   clorazepate (TRANXENE) 7.5 MG tablet [id number] Yes TAKE 1 TABLET BY MOUTH 4 TIMES DAILY AS NEEDED FOR ANXIETY. [name], MD Taking Active   fosinopril-hydrochlorothiazide (MONOPRIL-HCT) 20-12.5 MG tablet [id number] Yes TAKE 1 TABLET BY MOUTH  DAILY [name], MD Taking Active   Krill Oil 300 MG CAPS [id number] Yes Take 1,000 mg by mouth daily. [provider] Taking Active Self  Multiple Vitamins-Minerals (ICAPS AREDS 2 PO) [id number]  Take 1 capsule by mouth 2 (two) times daily. [provider]  Active   Polyethylene Glycol 3350 (MIRALAX PO) [id number] Yes Take 17 g by mouth daily as needed. [provider] Taking Active   potassium chloride SA (KLOR-CON M15) 15 MEQ tablet [id number] Yes TAKE 1 TABLET (15 MEQ TOTAL) BY MOUTH 2 (TWO) TIMES DAILY. [name], MD Taking Active   simvastatin (ZOCOR) 20 MG tablet [id number] Yes TAKE 1 TABLET BY MOUTH  DAILY AT 6 PM Fay Records, MD Taking Active             Patient Active Problem List   Diagnosis Date Noted   Diuretic-induced hypokalemia 12/08/2020   Osteopenia 12/23/2017   OAB (overactive bladder) 08/28/2016   Hypertriglyceridemia 10/03/2015   Paraproteinemia 07/05/2015   Venous stasis dermatitis of both lower extremities 07/05/2015   Vitamin D deficiency 06/27/2015   Fatty liver disease, nonalcoholic 19/37/9024   Tremor 07/22/2013  Hyperglycemia 07/22/2013   GAD (generalized anxiety disorder) 07/22/2013   Routine health maintenance 02/09/2013   Obesity, Class III, BMI 40-49.9  (morbid obesity) ([place]) 02/09/2013   Senile osteoporosis 06/30/2009   Hyperlipidemia with target LDL less than 130 06/03/2007   Essential hypertension 12/19/2006    Immunization History  Administered Date(s) Administered   Fluad Quad(high Dose 65+) 11/27/2018, 12/08/2019, 12/08/2020   Influenza, High Dose Seasonal PF 12/27/2015, 11/21/2016, 12/23/2017   Influenza,inj,Quad PF,6+ Mos 02/09/2013, 11/17/2013, 12/22/2014   Influenza-Unspecified 12/08/2019   Pneumococcal Conjugate-13 12/28/2014   Pneumococcal Polysaccharide-23 12/27/2015   Tdap 02/09/2013   Tetanus 02/09/2013    Conditions to be addressed/monitored:  Hypertension, Hyperlipidemia, Anxiety and Osteopenia, Fatty Liver disease  Care Plan : [place]  Updates made by Tomasa Blase, RPH since 05/12/2021 12:00 AM     Problem: Hypertension, Hyperlipidemia, Anxiety and Osteopenia, Fatty Liver disease   Priority: High     Long-Range Goal: Disease management   Start Date: 06/29/2020  Expected End Date: 05/12/2022  This Visit's Progress: On track  Recent Progress: On track  Priority: High  Note:   Current Barriers:  Unable to independently monitor therapeutic efficacy  Pharmacist Clinical Goal(s):  Patient will achieve adherence to monitoring guidelines and medication adherence to achieve therapeutic efficacy  Interventions: 1:1 collaboration with [name], MD regarding development and update of comprehensive plan of care as evidenced by provider attestation and co-signature Inter-disciplinary care team collaboration (see longitudinal plan of care) Comprehensive medication review performed; medication list updated in electronic medical record  Hypertension (BP goal <130/80) -Controlled - BP has been at goal in recent office visits; -Current treatment: Atenolol 50 mg daily (tremor) Fosinopril-HCTZ 20-12.5 mg daily -Current home readings: reports home nurse checks at times, last she could recall was  120/70 BP Readings from Last 3 Encounters:  05/03/21 134/77  12/08/20 136/84  06/16/20 138/82  -Current dietary habits: no special diet - eats bread, potatoes, chips  Lunch - hamburger helper, salad  Dinner - snacks (vanilla cream cookies), cabbage, broccoli  -Current exercise habits: none - does own exercise bike -Educated on BP goals and benefits of medications for prevention of heart attack, stroke and kidney damage; Daily salt intake goal < 2300 mg; Exercise goal of 150 minutes per week;  -Recommended to start using exercise bike - start small and increase as tolerated  Hyperlipidemia: (LDL goal < 100) -controlled LDL at goal -Last LDL: 77m/dL -Current treatment: Simvastatin 20 mg daily Aspirin 81 mg daily Krill Oil 300 mg (MegaRed) -Educated on Cholesterol goals; Benefits of statin for ASCVD risk reduction; Importance of limiting foods high in cholesterol; Exercise goal of 150 minutes per week; -Recommended to continue current medication, increase exercise to help lower TG's   Anxiety / Tremor (Goal: manage symptoms) -Controlled - taking clorazepate for over 40 years, pt reports symptoms are controlled; without medication she reports she has difficulty leaving her house; -Current treatment: Clorazepate 7.5 mg QID prn -PHQ9: 0 (11/2019) -GAD7: not on file -Connected with PCP for mental health support -Educated on potential side effects of clorazepate including tremor; pt does report tremor in R hand but currently manageable -Applied for tier exception - denied  Health Maintenance -Vaccine gaps: covid vaccine, Shingrix -Pt reports she will never get the covid vaccine; she is interested in Shingrix vaccine but is very uncomfortable getting a vaccine at a pharmacy -Advised to get Shingrix vaccine at local pharmacy; pt will think about it  Patient Goals/Self-Care Activities Patient will:  -  take medications as prescribed -focus on medication adherence by routine -target a  minimum of 150 minutes of moderate intensity exercise weekly (exercise bike) - start with 1 min at a time -engage in dietary modifications by reducing carbs/sugars, increasing vegetables    Care Gaps: Covid vaccine (never) Shingrix  Patient's preferred pharmacy is:  [employer] ([place], [address] [place] [address] [place] [address] Phone: [phone] Fax: [phone]  [id number] [place], [place] [address]: [phone]: [phone] [address] [facility] [place] [address]Phone: [phone]: [phone]  Uses pill box? No - prefers bottles Pt endorses 100% compliance  We discussed: Current pharmacy is preferred with insurance plan and patient is satisfied with pharmacy services Patient decided to: Continue current medication management strategy  Care Plan and Follow Up Patient Decision:  Patient agrees to Care Plan and Follow-up.  Plan: Telephone follow up appointment with care management team member scheduled for:  6 months  DTomasa Blase PharmD Clinical Pharmacist, [place]

## 2021-05-16 DIAGNOSIS — E785 Hyperlipidemia, unspecified: Secondary | ICD-10-CM | POA: Diagnosis not present

## 2021-05-16 DIAGNOSIS — I10 Essential (primary) hypertension: Secondary | ICD-10-CM | POA: Diagnosis not present

## 2021-05-16 DIAGNOSIS — E781 Pure hyperglyceridemia: Secondary | ICD-10-CM

## 2021-06-13 ENCOUNTER — Encounter: Payer: Self-pay | Admitting: Internal Medicine

## 2021-06-13 ENCOUNTER — Ambulatory Visit (INDEPENDENT_AMBULATORY_CARE_PROVIDER_SITE_OTHER): Payer: Medicare Other | Admitting: Internal Medicine

## 2021-06-13 VITALS — BP 134/76 | HR 69 | Temp 98.2°F | Ht 62.0 in | Wt 213.0 lb

## 2021-06-13 DIAGNOSIS — E876 Hypokalemia: Secondary | ICD-10-CM | POA: Diagnosis not present

## 2021-06-13 DIAGNOSIS — Z0001 Encounter for general adult medical examination with abnormal findings: Secondary | ICD-10-CM

## 2021-06-13 DIAGNOSIS — I10 Essential (primary) hypertension: Secondary | ICD-10-CM | POA: Diagnosis not present

## 2021-06-13 DIAGNOSIS — E559 Vitamin D deficiency, unspecified: Secondary | ICD-10-CM

## 2021-06-13 DIAGNOSIS — R739 Hyperglycemia, unspecified: Secondary | ICD-10-CM

## 2021-06-13 DIAGNOSIS — E785 Hyperlipidemia, unspecified: Secondary | ICD-10-CM | POA: Diagnosis not present

## 2021-06-13 DIAGNOSIS — T502X5A Adverse effect of carbonic-anhydrase inhibitors, benzothiadiazides and other diuretics, initial encounter: Secondary | ICD-10-CM

## 2021-06-13 DIAGNOSIS — Z23 Encounter for immunization: Secondary | ICD-10-CM

## 2021-06-13 LAB — CBC WITH DIFFERENTIAL/PLATELET
Basophils Absolute: 0 10*3/uL (ref 0.0–0.1)
Basophils Relative: 0.5 % (ref 0.0–3.0)
Eosinophils Absolute: 0 10*3/uL (ref 0.0–0.7)
Eosinophils Relative: 0.7 % (ref 0.0–5.0)
HCT: 38.5 % (ref 36.0–46.0)
Hemoglobin: 12.9 g/dL (ref 12.0–15.0)
Lymphocytes Relative: 15 % (ref 12.0–46.0)
Lymphs Abs: 1 10*3/uL (ref 0.7–4.0)
MCHC: 33.6 g/dL (ref 30.0–36.0)
MCV: 93.6 fl (ref 78.0–100.0)
Monocytes Absolute: 0.4 10*3/uL (ref 0.1–1.0)
Monocytes Relative: 6.5 % (ref 3.0–12.0)
Neutro Abs: 5 10*3/uL (ref 1.4–7.7)
Neutrophils Relative %: 77.3 % — ABNORMAL HIGH (ref 43.0–77.0)
Platelets: 245 10*3/uL (ref 150.0–400.0)
RBC: 4.11 Mil/uL (ref 3.87–5.11)
RDW: 12.9 % (ref 11.5–15.5)
WBC: 6.4 10*3/uL (ref 4.0–10.5)

## 2021-06-13 LAB — VITAMIN D 25 HYDROXY (VIT D DEFICIENCY, FRACTURES): VITD: 45.38 ng/mL (ref 30.00–100.00)

## 2021-06-13 LAB — BASIC METABOLIC PANEL
BUN: 12 mg/dL (ref 6–23)
CO2: 28 mEq/L (ref 19–32)
Calcium: 9.6 mg/dL (ref 8.4–10.5)
Chloride: 102 mEq/L (ref 96–112)
Creatinine, Ser: 0.74 mg/dL (ref 0.40–1.20)
GFR: 81.35 mL/min (ref >60.00)
Glucose, Bld: 112 mg/dL — ABNORMAL HIGH (ref 70–99)
Potassium: 3.3 mEq/L — ABNORMAL LOW (ref 3.5–5.1)
Sodium: 138 mEq/L (ref 135–145)

## 2021-06-13 LAB — HEMOGLOBIN A1C: Hgb A1c MFr Bld: 5.7 % (ref 4.6–6.5)

## 2021-06-13 LAB — LIPID PANEL
Cholesterol: 148 mg/dL (ref 0–200)
HDL: 47.8 mg/dL (ref >39.00)
NonHDL: 100.43
Total CHOL/HDL Ratio: 3
Triglycerides: 211 mg/dL — ABNORMAL HIGH (ref 0.0–149.0)
VLDL: 42.2 mg/dL — ABNORMAL HIGH (ref 0.0–40.0)

## 2021-06-13 LAB — LDL CHOLESTEROL, DIRECT: Direct LDL: 74 mg/dL

## 2021-06-13 LAB — MAGNESIUM: Magnesium: 1.7 mg/dL (ref 1.5–2.5)

## 2021-06-13 MED ORDER — OLMESARTAN MEDOXOMIL 20 MG PO TABS: 20.0000 mg | ORAL_TABLET | Freq: Every day | ORAL | 1 refills | Status: DC

## 2021-06-13 MED ORDER — SPIRONOLACTONE 25 MG PO TABS: 25.0000 mg | ORAL_TABLET | Freq: Every day | ORAL | 1 refills | Status: DC

## 2021-06-13 MED ORDER — SIMVASTATIN 20 MG PO TABS: ORAL_TABLET | ORAL | 1 refills | Status: DC

## 2021-06-13 NOTE — Progress Notes (Signed)
? ?Subjective:  ?Patient ID: Stephanie Pham, female    DOB: [birth year]  Age: 71 y.o. MRN: [id number] ? ?CC: Hypertension, Annual Exam, and Hyperlipidemia ? ?This visit occurred during the SARS-CoV-2 public health emergency.  Safety protocols were in place, including screening questions prior to the visit, additional usage of staff PPE, and extensive cleaning of exam room while observing appropriate contact time as indicated for disinfecting solutions.   ? ?HPI ?Stephanie Pham presents for f/up and a CPX. - ? ?She is not very active but denies chest pain, shortness of breath, diaphoresis, edema, or fatigue. ? ?Outpatient Medications Prior to Visit  ?Medication Sig Dispense Refill  ? acetaminophen (TYLENOL) 500 MG tablet Take 250 mg by mouth 3 (three) times daily as needed.    ? atenolol (TENORMIN) 50 MG tablet TAKE 1 TABLET BY MOUTH  DAILY 90 tablet 0  ? Cholecalciferol (VITAMIN D3) 50 MCG (2000 UT) CAPS Take 2,000 Units by mouth in the morning and at bedtime.    ? clorazepate (TRANXENE) 7.5 MG tablet TAKE 1 TABLET BY MOUTH 4 TIMES DAILY AS NEEDED FOR ANXIETY. 360 tablet 0  ? Krill Oil 300 MG CAPS Take 1,000 mg by mouth daily.    ? Multiple Vitamins-Minerals (ICAPS AREDS 2 PO) Take 1 capsule by mouth 2 (two) times daily.    ? fosinopril-hydrochlorothiazide (MONOPRIL-HCT) 20-12.5 MG tablet TAKE 1 TABLET BY MOUTH  DAILY 90 tablet 0  ? simvastatin (ZOCOR) 20 MG tablet TAKE 1 TABLET BY MOUTH  DAILY AT 6 PM 90 tablet 0  ? Polyethylene Glycol 3350 (MIRALAX PO) Take 17 g by mouth daily as needed.    ? potassium chloride SA (KLOR-CON M15) 15 MEQ tablet TAKE 1 TABLET (15 MEQ TOTAL) BY MOUTH 2 (TWO) TIMES DAILY. 180 tablet 0  ? ?No facility-administered medications prior to visit.  ? ? ?ROS ?Review of Systems  ?Constitutional:  Negative for chills, diaphoresis, fatigue and fever.  ?HENT: Negative.    ?Eyes: Negative.   ?Respiratory:  Negative for cough, chest tightness, shortness of breath and wheezing.   ?Cardiovascular:   Negative for chest pain, palpitations and leg swelling.  ?Gastrointestinal:  Negative for abdominal pain, constipation, diarrhea, nausea and vomiting.  ?Endocrine: Negative.   ?Genitourinary: Negative.  Negative for difficulty urinating.  ?Musculoskeletal: Negative.  Negative for myalgias.  ?Skin: Negative.   ?Neurological:  Negative for dizziness, weakness, light-headedness and headaches.  ?Psychiatric/Behavioral:  Negative for confusion, decreased concentration, dysphoric mood, sleep disturbance and suicidal ideas. The patient is nervous/anxious.   ? ?Objective:  ?BP 134/76 (BP Location: Left Arm, Patient Position: Sitting, Cuff Size: Large)   Pulse 69   Temp 98.2 ?F (36.8 ?C) (Oral)   Ht '5\' 2"'$  (1.575 m)   Wt 213 lb (96.6 kg)   SpO2 97%   BMI 38.96 kg/m?  ? ?BP Readings from Last 3 Encounters:  ?06/13/21 134/76  ?05/03/21 134/77  ?12/08/20 136/84  ? ? ?Wt Readings from Last 3 Encounters:  ?06/13/21 213 lb (96.6 kg)  ?05/03/21 215 lb (97.5 kg)  ?12/08/20 226 lb (102.5 kg)  ? ? ?Physical Exam ?Vitals reviewed.  ?Constitutional:   ?   General: She is not in acute distress. ?   Appearance: She is obese. She is not toxic-appearing or diaphoretic.  ?HENT:  ?   Nose: Nose normal.  ?   Mouth/Throat:  ?   Mouth: Mucous membranes are moist.  ?Eyes:  ?   General: No scleral icterus. ?   Conjunctiva/sclera: Conjunctivae  normal.  ?Cardiovascular:  ?   Rate and Rhythm: Normal rate and regular rhythm.  ?   Heart sounds: No murmur heard. ?Pulmonary:  ?   Effort: Pulmonary effort is normal.  ?   Breath sounds: No stridor. No wheezing, rhonchi or rales.  ?Abdominal:  ?   General: Abdomen is protuberant. Bowel sounds are normal. There is no distension.  ?   Palpations: Abdomen is soft. There is no hepatomegaly, splenomegaly or mass.  ?   Tenderness: There is no abdominal tenderness. There is no guarding or rebound.  ?Musculoskeletal:     ?   General: Normal range of motion.  ?   Cervical back: Neck supple.  ?   Right lower  leg: No edema.  ?   Left lower leg: No edema.  ?Lymphadenopathy:  ?   Cervical: No cervical adenopathy.  ?Skin: ?   General: Skin is warm and dry.  ?Neurological:  ?   General: No focal deficit present.  ?Psychiatric:     ?   Mood and Affect: Mood normal.     ?   Behavior: Behavior normal.  ? ? ?Lab Results  ?Component Value Date  ? WBC 6.4 06/13/2021  ? HGB 12.9 06/13/2021  ? HCT 38.5 06/13/2021  ? PLT 245.0 06/13/2021  ? GLUCOSE 112 (H) 06/13/2021  ? CHOL 148 06/13/2021  ? TRIG 211.0 (H) 06/13/2021  ? HDL 47.80 06/13/2021  ? LDLDIRECT 74.0 06/13/2021  ? ALT 18 12/08/2020  ? AST 20 12/08/2020  ? NA 138 06/13/2021  ? K 3.3 (L) 06/13/2021  ? CL 102 06/13/2021  ? CREATININE 0.74 06/13/2021  ? BUN 12 06/13/2021  ? CO2 28 06/13/2021  ? TSH 1.79 12/23/2019  ? INR 1.1 (H) 12/23/2019  ? HGBA1C 5.7 06/13/2021  ? ? ?No results found. ? ?Assessment & Plan:  ? ?Stephanie Pham was seen today for hypertension, annual exam and hyperlipidemia. ? ?Diagnoses and all orders for this visit: ? ?Essential hypertension- She has not achieved her blood pressure goal and continues to be hypokalemic.  I recommended that she upgrade to an ARB and a potassium sparing diuretic. ?-     Basic metabolic panel; Future ?-     Magnesium; Future ?-     CBC with Differential/Platelet; Future ?-     CBC with Differential/Platelet ?-     Magnesium ?-     Basic metabolic panel ?-     spironolactone (ALDACTONE) 25 MG tablet; Take 1 tablet (25 mg total) by mouth daily. ?-     olmesartan (BENICAR) 20 MG tablet; Take 1 tablet (20 mg total) by mouth daily. ? ?Diuretic-induced hypokalemia ?-     Basic metabolic panel; Future ?-     Magnesium; Future ?-     Magnesium ?-     Basic metabolic panel ?-     spironolactone (ALDACTONE) 25 MG tablet; Take 1 tablet (25 mg total) by mouth daily. ? ?Hyperglycemia- Her A1c is 5.7%.  Medical therapy is not indicated. ?-     Basic metabolic panel; Future ?-     Hemoglobin A1c; Future ?-     Hemoglobin A1c ?-     Basic metabolic  panel ? ?Hyperlipidemia with target LDL less than 130- LDL goal achieved. Doing well on the statin  ?-     simvastatin (ZOCOR) 20 MG tablet; TAKE 1 TABLET BY MOUTH  DAILY AT 6 PM ?-     Lipid panel; Future ?-     Lipid panel ? ?  Vitamin D deficiency ?-     VITAMIN D 25 Hydroxy (Vit-D Deficiency, Fractures); Future ?-     VITAMIN D 25 Hydroxy (Vit-D Deficiency, Fractures) ? ?Other orders ?-     LDL cholesterol, direct ? ? ?I have discontinued Francis Gaines. [place]'s potassium chloride SA, fosinopril-hydrochlorothiazide, and Polyethylene Glycol 3350 (MIRALAX PO). I am also having her start on spironolactone, olmesartan, and Shingrix. Additionally, I am having her maintain her Krill Oil, acetaminophen, Multiple Vitamins-Minerals (ICAPS AREDS 2 PO), atenolol, vitamin D3, clorazepate, and simvastatin. ? ?Meds ordered this encounter  ?Medications  ? simvastatin (ZOCOR) 20 MG tablet  ?  Sig: TAKE 1 TABLET BY MOUTH  DAILY AT 6 PM  ?  Dispense:  100 tablet  ?  Refill:  1  ? spironolactone (ALDACTONE) 25 MG tablet  ?  Sig: Take 1 tablet (25 mg total) by mouth daily.  ?  Dispense:  90 tablet  ?  Refill:  1  ? olmesartan (BENICAR) 20 MG tablet  ?  Sig: Take 1 tablet (20 mg total) by mouth daily.  ?  Dispense:  90 tablet  ?  Refill:  1  ? Zoster Vaccine Adjuvanted [facility]) injection  ?  Sig: Inject 0.5 mLs into the muscle once for 1 dose.  ?  Dispense:  0.5 mL  ?  Refill:  1  ? ? ? ?Follow-up: Return in about 6 months (around 12/14/2021). ? ?Scarlette Calico, MD ?

## 2021-06-13 NOTE — Patient Instructions (Signed)

## 2021-06-18 DIAGNOSIS — Z1231 Encounter for screening mammogram for malignant neoplasm of breast: Secondary | ICD-10-CM | POA: Insufficient documentation

## 2021-06-18 DIAGNOSIS — Z0001 Encounter for general adult medical examination with abnormal findings: Secondary | ICD-10-CM | POA: Insufficient documentation

## 2021-06-18 MED ORDER — SHINGRIX 50 MCG/0.5ML IM SUSR: 0.5000 mL | Freq: Once | INTRAMUSCULAR | 1 refills | Status: AC

## 2021-06-18 NOTE — Assessment & Plan Note (Signed)
Exam completed Labs reviewed Vaccines reviewed and updated Cancer screenings are up-to-date Patient education was given 

## 2021-07-06 ENCOUNTER — Other Ambulatory Visit: Payer: Self-pay | Admitting: Internal Medicine

## 2021-07-06 DIAGNOSIS — I10 Essential (primary) hypertension: Secondary | ICD-10-CM

## 2021-07-19 ENCOUNTER — Telehealth: Payer: Self-pay

## 2021-07-19 NOTE — Progress Notes (Signed)
? ? ?Chronic Care Management ?Pharmacy Assistant  ? ?Name: Stephanie Pham  MRN: [id number] DOB: [birth year] ? ? ?Reason for Encounter: Disease State-General Adherence ?  ? ?Recent office visits:  ?06/13/21 [name], MD-PCP (Annual exam) Blood work ordered; Med changes: simvastatin 20 mg, spironolactone 25 mg, olmesartan 20 mg, zoster vaccine adjuvanted inject. Discontinued potassium chloride SA, fosinopril-hydrochlorothiazide, and Polyethylene Glycol 3350 (MIRALAX PO) ? ?Recent consult visits:  ?None since last coordination call ? ?Hospital visits:  ?None since last coordination call ? ?Medications: ?Outpatient Encounter Medications as of 07/19/2021  ?Medication Sig  ? acetaminophen (TYLENOL) 500 MG tablet Take 250 mg by mouth 3 (three) times daily as needed.  ? atenolol (TENORMIN) 50 MG tablet TAKE 1 TABLET BY MOUTH DAILY  ? Cholecalciferol (VITAMIN D3) 50 MCG (2000 UT) CAPS Take 2,000 Units by mouth in the morning and at bedtime.  ? clorazepate (TRANXENE) 7.5 MG tablet TAKE 1 TABLET BY MOUTH 4 TIMES DAILY AS NEEDED FOR ANXIETY.  ? Krill Oil 300 MG CAPS Take 1,000 mg by mouth daily.  ? Multiple Vitamins-Minerals (ICAPS AREDS 2 PO) Take 1 capsule by mouth 2 (two) times daily.  ? olmesartan (BENICAR) 20 MG tablet Take 1 tablet (20 mg total) by mouth daily.  ? simvastatin (ZOCOR) 20 MG tablet TAKE 1 TABLET BY MOUTH  DAILY AT 6 PM  ? spironolactone (ALDACTONE) 25 MG tablet Take 1 tablet (25 mg total) by mouth daily.  ? ?No facility-administered encounter medications on file as of 07/19/2021.  ? ?Contacted Stephanie Pham for General Review Call ? ? ?Chart Review: ? ?Have there been any documented new, changed, or discontinued medications since last visit? Yes (If yes, include name, dose, frequency, date) ?Has there been any documented recent hospitalizations or ED visits since last visit with Clinical Pharmacist? No ?Brief Summary (including medication and/or Diagnosis changes): ? ? ?Adherence Review: ? ?Does the  Clinical Pharmacist Assistant have access to adherence rates? Yes ?Adherence rates for STAR metric medications (List medication(s)/day supply/ last 2 fill dates).Down below ?Adherence rates for medications indicated for disease state being reviewed (List medication(s)/day supply/ last 2 fill dates).Down below ?Does the patient have >5 day gap between last estimated fill dates for any of the above medications or other medication gaps? No ?Reason for medication gaps. ? ? ?Disease State Questions: ? ?Able to connect with Patient? Yes ?Did patient have any problems with their health recently? No ?Note problems and Concerns: ?Have you had any admissions or emergency room visits or worsening of your condition(s) since last visit? No ?Details of ED visit, hospital visit and/or worsening condition(s): ?Have you had any visits with new specialists or providers since your last visit? Yes ?Explain:Patient states that she saw Cardiologist in Feb for htn ?Have you had any new health care problem(s) since your last visit? No ?New problem(s) reported: ?Have you run out of any of your medications since you last spoke with clinical pharmacist? No ?What caused you to run out of your medications? ?Are there any medications you are not taking as prescribed? Yes ?What kept you from taking your medications as prescribed?Patient states that she is not taking olmesartan or spironolactone, does not like the side effects she reads about them. She states that she would like to stay on the atenolol and fosinopril,she believes they work better ?Are you having any issues or side effects with your medications? No ?Note of issues or side effects: ?Do you have any other health concerns or questions you  want to discuss with your Clinical Pharmacist before your next visit? No ?Note additional concerns and questions from Patient. ?Are there any health concerns that you feel we can do a better job addressing? No ?Note Patient's response. ?Are you  having any problems with any of the following since the last visit: (select all that apply) ? Other ? Details:Patient states that she has bad anxiety with leaving house, taking clorazepate for it ?12. Any falls since last visit? No ? Details: ?13. Any increased or uncontrolled pain since last visit? Yes ? Details:morning legs gets stiff ?14. Next visit Type: telephone ?      Visit with: Clinical Pharmacist ?       Date:11/09/21 ?       Time:11am ? ?15. Additional Details? No ?   ?Care Gaps: ?Colonoscopy- ?Diabetic Foot Exam- ?Mammogram- ?Ophthalmology- ?Dexa Scan -  ?Annual Well Visit -  ?Micro albumin- ?Hemoglobin A1c-  ? ?Star Rating Drugs: ?Olmesartan 20 mg-last fill 06/13/21 100 ds ?Simvastatin 20 mg-last fill 06/13/21 100 ds ? ?Ethelene Hal ?Clinical Pharmacist Assistant ?[phone]  ?

## 2021-07-20 ENCOUNTER — Other Ambulatory Visit: Payer: Self-pay | Admitting: Internal Medicine

## 2021-07-20 ENCOUNTER — Telehealth: Payer: Self-pay | Admitting: Internal Medicine

## 2021-07-20 DIAGNOSIS — F411 Generalized anxiety disorder: Secondary | ICD-10-CM

## 2021-07-20 MED ORDER — CLORAZEPATE DIPOTASSIUM 7.5 MG PO TABS: 7.5000 mg | ORAL_TABLET | Freq: Four times a day (QID) | ORAL | 0 refills | Status: DC | PRN

## 2021-07-20 NOTE — Telephone Encounter (Signed)
1.Medication Requested: ?clorazepate (TRANXENE) 7.5 MG tablet- 100 day supply  ?Fosinopril  ? ?2. Pharmacy (Name, Street, Pax): ?[facility] ([place], [place] Phone:  [phone]  ?Fax:  [phone]  ?  ? ?3. On Med List: ? ?4. Last Visit with PCP: ? ?5. Next visit date with PCP: ? ?2 new BP pills- pt does not wish to take anymore.  ? ?Agent: Please be advised that RX refills may take up to 3 business days. We ask that you follow-up with your pharmacy.  ?

## 2021-08-24 ENCOUNTER — Other Ambulatory Visit: Payer: Self-pay | Admitting: Internal Medicine

## 2021-08-24 DIAGNOSIS — E876 Hypokalemia: Secondary | ICD-10-CM

## 2021-08-24 DIAGNOSIS — I10 Essential (primary) hypertension: Secondary | ICD-10-CM

## 2021-08-24 MED ORDER — SPIRONOLACTONE 25 MG PO TABS: 25.0000 mg | ORAL_TABLET | Freq: Every day | ORAL | 0 refills | Status: DC

## 2021-08-24 MED ORDER — OLMESARTAN MEDOXOMIL 20 MG PO TABS: 20.0000 mg | ORAL_TABLET | Freq: Every day | ORAL | 0 refills | Status: DC

## 2021-08-29 ENCOUNTER — Other Ambulatory Visit: Payer: Self-pay | Admitting: Internal Medicine

## 2021-08-29 DIAGNOSIS — I10 Essential (primary) hypertension: Secondary | ICD-10-CM

## 2021-08-29 DIAGNOSIS — E876 Hypokalemia: Secondary | ICD-10-CM

## 2021-09-20 ENCOUNTER — Other Ambulatory Visit: Payer: Self-pay | Admitting: Internal Medicine

## 2021-09-20 DIAGNOSIS — I10 Essential (primary) hypertension: Secondary | ICD-10-CM

## 2021-10-09 ENCOUNTER — Other Ambulatory Visit: Payer: Self-pay | Admitting: Internal Medicine

## 2021-10-09 DIAGNOSIS — F411 Generalized anxiety disorder: Secondary | ICD-10-CM

## 2021-10-09 MED ORDER — CLORAZEPATE DIPOTASSIUM 7.5 MG PO TABS: 7.5000 mg | ORAL_TABLET | Freq: Four times a day (QID) | ORAL | 0 refills | Status: DC | PRN

## 2021-11-09 ENCOUNTER — Telehealth: Payer: Medicare Other

## 2021-12-06 ENCOUNTER — Ambulatory Visit (INDEPENDENT_AMBULATORY_CARE_PROVIDER_SITE_OTHER): Payer: Medicare Other | Admitting: Internal Medicine

## 2021-12-06 ENCOUNTER — Other Ambulatory Visit: Payer: Self-pay | Admitting: Internal Medicine

## 2021-12-06 ENCOUNTER — Encounter: Payer: Self-pay | Admitting: Internal Medicine

## 2021-12-06 VITALS — BP 120/84 | HR 59 | Temp 98.2°F | Ht 62.0 in | Wt 201.0 lb

## 2021-12-06 DIAGNOSIS — I10 Essential (primary) hypertension: Secondary | ICD-10-CM

## 2021-12-06 DIAGNOSIS — D892 Hypergammaglobulinemia, unspecified: Secondary | ICD-10-CM

## 2021-12-06 DIAGNOSIS — R10819 Abdominal tenderness, unspecified site: Secondary | ICD-10-CM

## 2021-12-06 DIAGNOSIS — K760 Fatty (change of) liver, not elsewhere classified: Secondary | ICD-10-CM

## 2021-12-06 DIAGNOSIS — T502X5A Adverse effect of carbonic-anhydrase inhibitors, benzothiadiazides and other diuretics, initial encounter: Secondary | ICD-10-CM | POA: Diagnosis not present

## 2021-12-06 DIAGNOSIS — E785 Hyperlipidemia, unspecified: Secondary | ICD-10-CM

## 2021-12-06 DIAGNOSIS — R634 Abnormal weight loss: Secondary | ICD-10-CM | POA: Diagnosis not present

## 2021-12-06 DIAGNOSIS — E876 Hypokalemia: Secondary | ICD-10-CM | POA: Diagnosis not present

## 2021-12-06 DIAGNOSIS — Z23 Encounter for immunization: Secondary | ICD-10-CM

## 2021-12-06 LAB — BASIC METABOLIC PANEL
BUN: 9 mg/dL (ref 6–23)
CO2: 28 mEq/L (ref 19–32)
Calcium: 9.5 mg/dL (ref 8.4–10.5)
Chloride: 100 mEq/L (ref 96–112)
Creatinine, Ser: 0.67 mg/dL (ref 0.40–1.20)
GFR: 87.59 mL/min (ref >60.00)
Glucose, Bld: 109 mg/dL — ABNORMAL HIGH (ref 70–99)
Potassium: 3.1 mEq/L — ABNORMAL LOW (ref 3.5–5.1)
Sodium: 140 mEq/L (ref 135–145)

## 2021-12-06 LAB — URINALYSIS, ROUTINE W REFLEX MICROSCOPIC
Bilirubin Urine: NEGATIVE
Hgb urine dipstick: NEGATIVE
Ketones, ur: NEGATIVE
Nitrite: NEGATIVE
Specific Gravity, Urine: 1.015 (ref 1.000–1.030)
Total Protein, Urine: NEGATIVE
Urine Glucose: NEGATIVE
Urobilinogen, UA: 0.2 (ref 0.0–1.0)
pH: 7.5 (ref 5.0–8.0)

## 2021-12-06 LAB — CBC WITH DIFFERENTIAL/PLATELET
Basophils Absolute: 0 10*3/uL (ref 0.0–0.1)
Basophils Relative: 0.5 % (ref 0.0–3.0)
Eosinophils Absolute: 0 10*3/uL (ref 0.0–0.7)
Eosinophils Relative: 0.5 % (ref 0.0–5.0)
HCT: 40.2 % (ref 36.0–46.0)
Hemoglobin: 13.6 g/dL (ref 12.0–15.0)
Lymphocytes Relative: 12.1 % (ref 12.0–46.0)
Lymphs Abs: 0.8 10*3/uL (ref 0.7–4.0)
MCHC: 33.9 g/dL (ref 30.0–36.0)
MCV: 94.1 fl (ref 78.0–100.0)
Monocytes Absolute: 0.4 10*3/uL (ref 0.1–1.0)
Monocytes Relative: 6.5 % (ref 3.0–12.0)
Neutro Abs: 5.4 10*3/uL (ref 1.4–7.7)
Neutrophils Relative %: 80.4 % — ABNORMAL HIGH (ref 43.0–77.0)
Platelets: 241 10*3/uL (ref 150.0–400.0)
RBC: 4.27 Mil/uL (ref 3.87–5.11)
RDW: 13.1 % (ref 11.5–15.5)
WBC: 6.7 10*3/uL (ref 4.0–10.5)

## 2021-12-06 LAB — HEPATIC FUNCTION PANEL
ALT: 14 U/L (ref 0–35)
AST: 14 U/L (ref 0–37)
Albumin: 4.1 g/dL (ref 3.5–5.2)
Alkaline Phosphatase: 61 U/L (ref 39–117)
Bilirubin, Direct: 0.1 mg/dL (ref 0.0–0.3)
Total Bilirubin: 0.7 mg/dL (ref 0.2–1.2)
Total Protein: 7.1 g/dL (ref 6.0–8.3)

## 2021-12-06 LAB — MAGNESIUM: Magnesium: 1.8 mg/dL (ref 1.5–2.5)

## 2021-12-06 MED ORDER — POTASSIUM CHLORIDE CRYS ER 15 MEQ PO TBCR: 15.0000 meq | EXTENDED_RELEASE_TABLET | Freq: Two times a day (BID) | ORAL | 1 refills | Status: DC

## 2021-12-06 NOTE — Progress Notes (Signed)
Subjective:  Patient ID: Stephanie Pham, female    DOB: [birth year]  Age: 71 y.o. MRN: [id number]  CC: Hypertension and Hyperlipidemia   HPI Stephanie Pham presents for f/up -  She complains of twinges under her sternum during the night.  She also has heartburn and burping.  She complains of weight loss and nausea.  She denies vomiting, diarrhea, or constipation.  She has rare nonproductive cough and despite my advice continues to take a prescription for fosinopril.  Outpatient Medications Prior to Visit  Medication Sig Dispense Refill   acetaminophen (TYLENOL) 500 MG tablet Take 250 mg by mouth 3 (three) times daily as needed.     atenolol (TENORMIN) 50 MG tablet TAKE 1 TABLET BY MOUTH DAILY 100 tablet 1   Cholecalciferol (VITAMIN D3) 50 MCG (2000 UT) CAPS Take 2,000 Units by mouth in the morning and at bedtime.     clorazepate (TRANXENE) 7.5 MG tablet Take 1 tablet (7.5 mg total) by mouth 4 (four) times daily as needed for anxiety. 400 tablet 0   Krill Oil 300 MG CAPS Take 1,000 mg by mouth daily.     Multiple Vitamins-Minerals (ICAPS AREDS 2 PO) Take 1 capsule by mouth 2 (two) times daily.     olmesartan (BENICAR) 20 MG tablet TAKE 1 TABLET BY MOUTH DAILY 100 tablet 0   simvastatin (ZOCOR) 20 MG tablet TAKE 1 TABLET BY MOUTH  DAILY AT 6 PM 100 tablet 1   spironolactone (ALDACTONE) 25 MG tablet TAKE 1 TABLET BY MOUTH DAILY 100 tablet 0   No facility-administered medications prior to visit.    ROS Review of Systems  Constitutional:  Positive for unexpected weight change. Negative for chills, diaphoresis, fatigue and fever.  HENT:  Negative for sore throat and trouble swallowing.   Eyes: Negative.   Respiratory:  Positive for cough. Negative for chest tightness, shortness of breath and wheezing.   Cardiovascular:  Positive for chest pain. Negative for palpitations and leg swelling.  Gastrointestinal:  Positive for abdominal pain and nausea. Negative for constipation, diarrhea  and vomiting.  Endocrine: Negative.   Genitourinary:  Positive for flank pain. Negative for difficulty urinating, dysuria, frequency, hematuria and urgency.  Musculoskeletal:  Positive for gait problem. Negative for arthralgias.  Skin:  Negative for color change, pallor and rash.  Neurological:  Positive for tremors and weakness. Negative for dizziness and numbness.  Hematological:  Negative for adenopathy. Does not bruise/bleed easily.  Psychiatric/Behavioral: Negative.      Objective:  BP 120/84 (BP Location: Right Arm, Patient Position: Sitting, Cuff Size: Normal)   Pulse (!) 59   Temp 98.2 F (36.8 C) (Oral)   Ht '5\' 2"'$  (1.575 m)   Wt 201 lb (91.2 kg)   SpO2 99%   BMI 36.76 kg/m   BP Readings from Last 3 Encounters:  12/06/21 120/84  06/13/21 134/76  05/03/21 134/77    Wt Readings from Last 3 Encounters:  12/06/21 201 lb (91.2 kg)  06/13/21 213 lb (96.6 kg)  05/03/21 215 lb (97.5 kg)    Physical Exam Vitals reviewed.  Constitutional:      General: She is not in acute distress.    Appearance: She is obese. She is ill-appearing (in a wheelchair). She is not toxic-appearing or diaphoretic.  HENT:     Nose: Nose normal.     Mouth/Throat:     Mouth: Mucous membranes are moist.  Eyes:     General: No scleral icterus.    Conjunctiva/sclera: Conjunctivae  normal.  Cardiovascular:     Rate and Rhythm: Bradycardia present.     Heart sounds: Normal heart sounds, S1 normal and S2 normal. No murmur heard.    No friction rub. No gallop.     Comments: EKG- SB, 57 bpm NS ST abn - old ++artifact No LVH or Q waves Pulmonary:     Effort: Pulmonary effort is normal.     Breath sounds: No stridor. No wheezing, rhonchi or rales.  Abdominal:     General: Abdomen is protuberant. There is no distension.     Palpations: There is no hepatomegaly or splenomegaly.     Tenderness: There is abdominal tenderness in the right upper quadrant.     Comments: Ttp in right flank   Musculoskeletal:     Cervical back: Neck supple.     Right lower leg: No edema.     Left lower leg: No edema.  Skin:    General: Skin is warm and dry.     Findings: Lesion present.     Comments: Alligator skin BLE  Neurological:     General: No focal deficit present.     Mental Status: She is alert.  Psychiatric:        Mood and Affect: Mood normal.        Behavior: Behavior normal.     Lab Results  Component Value Date   WBC 6.7 12/06/2021   HGB 13.6 12/06/2021   HCT 40.2 12/06/2021   PLT 241.0 12/06/2021   GLUCOSE 109 (H) 12/06/2021   CHOL 148 06/13/2021   TRIG 211.0 (H) 06/13/2021   HDL 47.80 06/13/2021   LDLDIRECT 74.0 06/13/2021   ALT 14 12/06/2021   AST 14 12/06/2021   NA 140 12/06/2021   K 3.1 (L) 12/06/2021   CL 100 12/06/2021   CREATININE 0.67 12/06/2021   BUN 9 12/06/2021   CO2 28 12/06/2021   TSH 1.79 12/23/2019   INR 1.1 (H) 12/23/2019   HGBA1C 5.7 06/13/2021    No results found.  Assessment & Plan:   Stephanie Pham was seen today for hypertension and hyperlipidemia.  Diagnoses and all orders for this visit:  Essential hypertension- Her blood pressure is well controlled.  I will not prescribe an ACE inhibitor. -     Basic metabolic panel; Future -     Magnesium; Future -     EKG 12-Lead -     Urinalysis, Routine w reflex microscopic; Future -     CBC with Differential/Platelet; Future -     CBC with Differential/Platelet -     Urinalysis, Routine w reflex microscopic -     Magnesium -     Basic metabolic panel -     potassium chloride SA (KLOR-CON M15) 15 MEQ tablet; Take 1 tablet (15 mEq total) by mouth 2 (two) times daily.  Fatty liver disease, nonalcoholic- Her LFTs are normal. -     Hepatic function panel; Future -     Hepatic function panel  Diuretic-induced hypokalemia -     Basic metabolic panel; Future -     Magnesium; Future -     Magnesium -     Basic metabolic panel -     potassium chloride SA (KLOR-CON M15) 15 MEQ tablet; Take 1  tablet (15 mEq total) by mouth 2 (two) times daily.  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  I recommended that she undergo an MRI without contrast to evaluate for a malignant process. -  Hepatic function panel; Future -     CBC with Differential/Platelet; Future -     CBC with Differential/Platelet -     Hepatic function panel -     MR ABDOMEN WO CONTRAST; Future  Flu vaccine need -     Flu Vaccine QUAD High Dose(Fluad)  Hyperlipidemia with target LDL less than 130 -     simvastatin (ZOCOR) 20 MG tablet; TAKE 1 TABLET BY MOUTH  DAILY AT 6 PM  Abdominal tenderness in right flank -     MR ABDOMEN WO CONTRAST; Future  Abnormal weight loss -     MR ABDOMEN WO CONTRAST; Future  Other orders -     Pneumococcal polysaccharide vaccine 23-valent greater than or equal to 2yo subcutaneous/IM   I have discontinued Francis Gaines. [place]'s olmesartan and spironolactone. I am also having her start on potassium chloride SA. Additionally, I am having her maintain her Krill Oil, acetaminophen, Multiple Vitamins-Minerals (ICAPS AREDS 2 PO), vitamin D3, atenolol, clorazepate, and simvastatin.  Meds ordered this encounter  Medications   potassium chloride SA (KLOR-CON M15) 15 MEQ tablet    Sig: Take 1 tablet (15 mEq total) by mouth 2 (two) times daily.    Dispense:  180 tablet    Refill:  1   simvastatin (ZOCOR) 20 MG tablet    Sig: TAKE 1 TABLET BY MOUTH  DAILY AT 6 PM    Dispense:  100 tablet    Refill:  1     Follow-up: Return in about 3 months (around 03/07/2022).  Scarlette Calico, MD

## 2021-12-06 NOTE — Patient Instructions (Signed)
Hypertension, Adult High blood pressure (hypertension) is when the force of blood pumping through the arteries is too strong. The arteries are the blood vessels that carry blood from the heart throughout the body. Hypertension forces the heart to work harder to pump blood and may cause arteries to become narrow or stiff. Untreated or uncontrolled hypertension can lead to a heart attack, heart failure, a stroke, kidney disease, and other problems. A blood pressure reading consists of a higher number over a lower number. Ideally, your blood pressure should be below 120/80. The first ("top") number is called the systolic pressure. It is a measure of the pressure in your arteries as your heart beats. The second ("bottom") number is called the diastolic pressure. It is a measure of the pressure in your arteries as the heart relaxes. What are the causes? The exact cause of this condition is not known. There are some conditions that result in high blood pressure. What increases the risk? Certain factors may make you more likely to develop high blood pressure. Some of these risk factors are under your control, including: Smoking. Not getting enough exercise or physical activity. Being overweight. Having too much fat, sugar, calories, or salt (sodium) in your diet. Drinking too much alcohol. Other risk factors include: Having a personal history of heart disease, diabetes, high cholesterol, or kidney disease. Stress. Having a family history of high blood pressure and high cholesterol. Having obstructive sleep apnea. Age. The risk increases with age. What are the signs or symptoms? High blood pressure may not cause symptoms. Very high blood pressure (hypertensive crisis) may cause: Headache. Fast or irregular heartbeats (palpitations). Shortness of breath. Nosebleed. Nausea and vomiting. Vision changes. Severe chest pain, dizziness, and seizures. How is this diagnosed? This condition is diagnosed by  measuring your blood pressure while you are seated, with your arm resting on a flat surface, your legs uncrossed, and your feet flat on the floor. The cuff of the blood pressure monitor will be placed directly against the skin of your upper arm at the level of your heart. Blood pressure should be measured at least twice using the same arm. Certain conditions can cause a difference in blood pressure between your right and left arms. If you have a high blood pressure reading during one visit or you have normal blood pressure with other risk factors, you may be asked to: Return on a different day to have your blood pressure checked again. Monitor your blood pressure at home for 1 week or longer. If you are diagnosed with hypertension, you may have other blood or imaging tests to help your health care provider understand your overall risk for other conditions. How is this treated? This condition is treated by making healthy lifestyle changes, such as eating healthy foods, exercising more, and reducing your alcohol intake. You may be referred for counseling on a healthy diet and physical activity. Your health care provider may prescribe medicine if lifestyle changes are not enough to get your blood pressure under control and if: Your systolic blood pressure is above 130. Your diastolic blood pressure is above 80. Your personal target blood pressure may vary depending on your medical conditions, your age, and other factors. Follow these instructions at home: Eating and drinking  Eat a diet that is high in fiber and potassium, and low in sodium, added sugar, and fat. An example of this eating plan is called the DASH diet. DASH stands for Dietary Approaches to Stop Hypertension. To eat this way: Eat   plenty of fresh fruits and vegetables. Try to fill one half of your plate at each meal with fruits and vegetables. Eat whole grains, such as whole-wheat pasta, brown rice, or whole-grain bread. Fill about one  fourth of your plate with whole grains. Eat or drink low-fat dairy products, such as skim milk or low-fat yogurt. Avoid fatty cuts of meat, processed or cured meats, and poultry with skin. Fill about one fourth of your plate with lean proteins, such as fish, chicken without skin, beans, eggs, or tofu. Avoid pre-made and processed foods. These tend to be higher in sodium, added sugar, and fat. Reduce your daily sodium intake. Many people with hypertension should eat less than 1,500 mg of sodium a day. Do not drink alcohol if: Your health care provider tells you not to drink. You are pregnant, may be pregnant, or are planning to become pregnant. If you drink alcohol: Limit how much you have to: 0-1 drink a day for women. 0-2 drinks a day for men. Know how much alcohol is in your drink. In the [place] one drink equals one 12 oz bottle of beer (355 mL), one 5 oz glass of wine (148 mL), or one 1 oz glass of hard liquor (44 mL). Lifestyle  Work with your health care provider to maintain a healthy body weight or to lose weight. Ask what an ideal weight is for you. Get at least 30 minutes of exercise that causes your heart to beat faster (aerobic exercise) most days of the week. Activities may include walking, swimming, or biking. Include exercise to strengthen your muscles (resistance exercise), such as Pilates or lifting weights, as part of your weekly exercise routine. Try to do these types of exercises for 30 minutes at least 3 days a week. Do not use any products that contain nicotine or tobacco. These products include cigarettes, chewing tobacco, and vaping devices, such as e-cigarettes. If you need help quitting, ask your health care provider. Monitor your blood pressure at home as told by your health care provider. Keep all follow-up visits. This is important. Medicines Take over-the-counter and prescription medicines only as told by your health care provider. Follow directions carefully. Blood  pressure medicines must be taken as prescribed. Do not skip doses of blood pressure medicine. Doing this puts you at risk for problems and can make the medicine less effective. Ask your health care provider about side effects or reactions to medicines that you should watch for. Contact a health care provider if you: Think you are having a reaction to a medicine you are taking. Have headaches that keep coming back (recurring). Feel dizzy. Have swelling in your ankles. Have trouble with your vision. Get help right away if you: Develop a severe headache or confusion. Have unusual weakness or numbness. Feel faint. Have severe pain in your chest or abdomen. Vomit repeatedly. Have trouble breathing. These symptoms may be an emergency. Get help right away. Call 911. Do not wait to see if the symptoms will go away. Do not drive yourself to the hospital. Summary Hypertension is when the force of blood pumping through your arteries is too strong. If this condition is not controlled, it may put you at risk for serious complications. Your personal target blood pressure may vary depending on your medical conditions, your age, and other factors. For most people, a normal blood pressure is less than 120/80. Hypertension is treated with lifestyle changes, medicines, or a combination of both. Lifestyle changes include losing weight, eating a healthy,   low-sodium diet, exercising more, and limiting alcohol. This information is not intended to replace advice given to you by your health care provider. Make sure you discuss any questions you have with your health care provider. Document Revised: 01/10/2021 Document Reviewed: 01/10/2021 Elsevier Patient Education  2023 Elsevier Inc.  

## 2021-12-08 ENCOUNTER — Other Ambulatory Visit: Payer: Self-pay | Admitting: Internal Medicine

## 2021-12-08 DIAGNOSIS — E785 Hyperlipidemia, unspecified: Secondary | ICD-10-CM

## 2021-12-08 DIAGNOSIS — R10819 Abdominal tenderness, unspecified site: Secondary | ICD-10-CM | POA: Insufficient documentation

## 2021-12-08 DIAGNOSIS — R634 Abnormal weight loss: Secondary | ICD-10-CM | POA: Insufficient documentation

## 2021-12-08 MED ORDER — SIMVASTATIN 20 MG PO TABS: ORAL_TABLET | ORAL | 1 refills | Status: DC

## 2021-12-11 ENCOUNTER — Ambulatory Visit (INDEPENDENT_AMBULATORY_CARE_PROVIDER_SITE_OTHER): Payer: Medicare Other | Admitting: *Deleted

## 2021-12-11 VITALS — Ht 62.0 in | Wt 201.0 lb

## 2021-12-11 DIAGNOSIS — Z0000 Encounter for general adult medical examination without abnormal findings: Secondary | ICD-10-CM

## 2021-12-11 NOTE — Patient Instructions (Signed)

## 2021-12-11 NOTE — Progress Notes (Signed)
Subjective:   Stephanie Pham is a 71 y.o. female who presents for Medicare Annual (Subsequent) preventive examination. I connected with  Judieth Keens on 12/11/21 by a audio enabled telemedicine application and verified that I am speaking with the correct person using two identifiers.  Patient Location: Home  Provider Location: Office/Clinic  I discussed the limitations of evaluation and management by telemedicine. The patient expressed understanding and agreed to proceed.   Review of Systems    Defer to PCP  See problem list for additional risk factors       Objective:    Today's Vitals   12/11/21 1033  Weight: 201 lb (91.2 kg)  Height: '5\' 2"'$  (1.575 m)   Body mass index is 36.76 kg/m.     12/11/2021   10:40 AM 12/09/2020   10:06 AM 12/08/2019    9:56 AM 11/27/2018   10:35 AM 11/22/2017    9:52 AM 11/21/2016    9:35 AM 11/13/2016    7:28 AM  Advanced Directives  Does Patient Have a Medical Advance Directive? No No No No No No No  Does patient want to make changes to medical advance directive?     Yes (ED - Information included in AVS)    Would patient like information on creating a medical advance directive? No - Patient declined No - Patient declined No - Patient declined No - Patient declined  Yes (ED - Information included in AVS)     Current Medications (verified) Outpatient Encounter Medications as of 12/11/2021  Medication Sig   acetaminophen (TYLENOL) 500 MG tablet Take 250 mg by mouth 3 (three) times daily as needed.   atenolol (TENORMIN) 50 MG tablet TAKE 1 TABLET BY MOUTH DAILY   Cholecalciferol (VITAMIN D3) 50 MCG (2000 UT) CAPS Take 2,000 Units by mouth in the morning and at bedtime.   clorazepate (TRANXENE) 7.5 MG tablet Take 1 tablet (7.5 mg total) by mouth 4 (four) times daily as needed for anxiety.   fosinopril-hydrochlorothiazide (MONOPRIL-HCT) 20-12.5 MG tablet Take 1 tablet by mouth daily.   Krill Oil 300 MG CAPS Take 1,000 mg by mouth daily.    Multiple Vitamins-Minerals (ICAPS AREDS 2 PO) Take 1 capsule by mouth 2 (two) times daily.   potassium chloride SA (KLOR-CON M15) 15 MEQ tablet Take 1 tablet (15 mEq total) by mouth 2 (two) times daily.   simvastatin (ZOCOR) 20 MG tablet TAKE 1 TABLET BY MOUTH  DAILY AT 6 PM   No facility-administered encounter medications on file as of 12/11/2021.    Allergies (verified) Codeine, Iohexol, Ivp dye [iodinated contrast media], Minocycline, and Sulfonamide derivatives   History: Past Medical History:  Diagnosis Date   Allergy    Anemia    Blood transfusion without reported diagnosis 1976   Cancer ([place])    colon cancer 1976   Cataract    both eyes-surgery   Chronic anxiety    Chronic depression    Diverticulosis of colon    patient said it may have been diverticulitis on cipro for a long period   Dyslipidemia    Hemorrhoids    Hyperlipidemia    Hypertension    Internal hemorrhoid    Macular degeneration    Osteoporosis    neck and hip osteopenia   Past Surgical History:  Procedure Laterality Date   ABDOMINAL HYSTERECTOMY     CATARACT EXTRACTION     left eye 05-13-2013   CHOLECYSTECTOMY     COLON SURGERY  2008  COLONOSCOPY     laparotomy and removal of polyp     sigmoid colectomy     Family History  Problem Relation Age of Onset   Fibromyalgia Mother    Hypertension Mother    Other Mother        "hallucinations" and fibromyalgia   Ovarian cysts Mother        "growths"   Heart disease Father    Diabetes Father    Heart attack Father 27       d. due to 2nd heart attack at 56   Breast cancer Maternal Aunt        dx 71s; s/p mastectomy   Other Maternal Aunt        hx of colon surgery in her 29s, lim info   COPD Brother    Heart attack Brother    Kidney disease Brother        smoker; d. 34   Bipolar disorder Brother    Skin cancer Brother        described as a "black mole"   Diabetes Brother    Endometriosis Daughter    Other Daughter        hx of breast  biopsies   Diabetes Paternal Aunt    AAA (abdominal aortic aneurysm) Paternal Aunt    Stroke Maternal Grandfather    Other Maternal Grandfather        had an operation on his intestines to address constipation   Stroke Paternal Aunt    Stroke Paternal Aunt    Diabetes Paternal Uncle    Cancer Cousin        d. NOS cancer (maybe liver or lung cancer); +EtOH   Diabetes Paternal Grandmother    Other Paternal Grandmother        intestinal issues   Stroke Paternal Grandfather    Bipolar disorder Other    Colon cancer Neg Hx    Rectal cancer Neg Hx    Stomach cancer Neg Hx    Esophageal cancer Neg Hx    Colon polyps Neg Hx    Social History   Socioeconomic History   Marital status: Married    Spouse name: Not on file   Number of children: 3   Years of education: Not on file   Highest education level: Not on file  Occupational History   Occupation: Retired  Tobacco Use   Smoking status: Never   Smokeless tobacco: Never  Vaping Use   Vaping Use: Never used  Substance and Sexual Activity   Alcohol use: No    Alcohol/week: 0.0 standard drinks of alcohol   Drug use: No   Sexual activity: Not Currently  Other Topics Concern   Not on file  Social History Narrative   Married 1968   2 sons, 1 daughter lost one son in [place]   Full-time homemaker   Social Determinants of Health   Financial Resource Strain: Low Risk  (12/11/2021)   Overall Financial Resource Strain (CARDIA)    Difficulty of Paying Living Expenses: Not hard at all  Food Insecurity: No Food Insecurity (12/11/2021)   Hunger Vital Sign    Worried About Running Out of Food in the Last Year: Never true    Ran Out of Food in the Last Year: Never true  Transportation Needs: No Transportation Needs (12/09/2020)   PRAPARE - Hydrologist (Medical): No    Lack of Transportation (Non-Medical): No  Physical Activity: Inactive (12/11/2021)   Exercise  Vital Sign    Days of Exercise per Week: 0 days     Minutes of Exercise per Session: 0 min  Stress: No Stress Concern Present (12/11/2021)   [place]    Feeling of Stress : Only a little  Social Connections: Moderately Isolated (12/11/2021)   Social Connection and Isolation Panel [NHANES]    Frequency of Communication with Friends and Family: Three times a week    Frequency of Social Gatherings with Friends and Family: Once a week    Attends Religious Services: Never    Marine scientist or Organizations: No    Attends Music therapist: Never    Marital Status: Married    Tobacco Counseling Counseling given: Not Answered   Clinical Intake:  Pre-visit preparation completed: Yes  Pain : No/denies pain     Nutritional Status: BMI > 30  Obese Nutritional Risks: None Diabetes: No  What is the last grade level you completed in school?: 12th grade  Diabetic?no  Interpreter Needed?: No      Activities of Daily Living    12/11/2021   10:45 AM  In your present state of health, do you have any difficulty performing the following activities:  Hearing? 0  Vision? 1  Difficulty concentrating or making decisions? 0  Walking or climbing stairs? 1  Dressing or bathing? 0  Doing errands, shopping? 0    Patient Care Team: [name], MD as PCP - General (Internal Medicine) Fay Records, MD as PCP - Cardiology (Cardiology) Druscilla Brownie, MD as Consulting Physician (Dermatology) Marygrace Drought, MD as Consulting Physician (Ophthalmology) Delice Bison, Darnelle Maffucci, [facility] (Inactive) (Pharmacist)  Indicate any recent Medical Services you may have received from other than [facility] providers in the past year (date may be approximate).     Assessment:   This is a routine wellness examination for Oleva.  Hearing/Vision screen No results found.  Dietary issues and exercise activities discussed: Current Exercise Habits: The patient does not  participate in regular exercise at present   Goals Addressed   None   Depression Screen    12/11/2021   10:38 AM 12/09/2020   10:20 AM 12/08/2020    9:31 AM 12/08/2019   10:26 AM 11/27/2018   10:35 AM 11/22/2017    9:56 AM 11/21/2016    9:35 AM  PHQ 2/9 Scores  PHQ - 2 Score 1 0 0 0 '2 1 1  '$ PHQ- 9 Score     '4 3 2    '$ Fall Risk    12/11/2021   10:35 AM 12/09/2020   10:10 AM 12/08/2020    9:31 AM 12/08/2019   11:01 AM 11/27/2018   10:35 AM  Fall Risk   Falls in the past year? 0 0 0 0 1  Number falls in past yr: 0 0  0 0  Injury with Fall? 0 0  0 0  Risk for fall due to : No Fall Risks No Fall Risks  No Fall Risks Impaired balance/gait;Impaired mobility  Follow up Falls evaluation completed Falls evaluation completed  Falls evaluation completed;Education provided Falls prevention discussed    FALL RISK PREVENTION PERTAINING TO THE HOME:  Any stairs in or around the home? Yes  If so, are there any without handrails? Yes  Home free of loose throw rugs in walkways, pet beds, electrical cords, etc? Yes  Adequate lighting in your home to reduce risk of falls? Yes   ASSISTIVE DEVICES UTILIZED  TO PREVENT FALLS:  Life alert? Yes  Use of a cane, walker or w/c? Yes  Grab bars in the bathroom? Yes  Shower chair or bench in shower? Yes  Elevated toilet seat or a handicapped toilet? No   TIMED UP AND GO:  Was the test performed? No .  Length of time to ambulate 10 feet: n/a sec.     Cognitive Function:        12/11/2021   10:45 AM 12/08/2019   11:03 AM  6CIT Screen  What Year? 0 points 0 points  What month? 0 points 0 points  What time? 0 points 3 points  Count back from 20 0 points 0 points  Months in reverse 2 points 0 points  Repeat phrase 0 points 0 points  Total Score 2 points 3 points    Immunizations Immunization History  Administered Date(s) Administered   Fluad Quad(high Dose 65+) 11/27/2018, 12/08/2019, 12/08/2020, 12/06/2021   Influenza, High Dose Seasonal PF  12/27/2015, 11/21/2016, 12/23/2017   Influenza,inj,Quad PF,6+ Mos 02/09/2013, 11/17/2013, 12/22/2014   Influenza-Unspecified 12/08/2019   Pneumococcal Conjugate-13 12/28/2014   Pneumococcal Polysaccharide-23 12/27/2015, 12/06/2021   Tdap 02/09/2013   Tetanus 02/09/2013    TDAP status: Up to date  Flu Vaccine status: Up to date  Pneumococcal vaccine status: Up to date  Covid Vacines- declined  Qualifies for Shingles Vaccine? Yes   Zostavax completed No   Shingrix Completed?: No.    Education has been provided regarding the importance of this vaccine. Patient has been advised to call insurance company to determine out of pocket expense if they have not yet received this vaccine. Advised may also receive vaccine at local pharmacy or Health Dept. Verbalized acceptance and understanding.  Screening Tests Health Maintenance  Topic Date Due   Zoster Vaccines- Shingrix (1 of 2) 03/12/2022 (Originally 12/10/1969)   COLONOSCOPY (Pts 45-57yr Insurance coverage will need to be confirmed)  06/11/2022 (Originally 11/15/2021)   MAMMOGRAM  12/13/2022   TETANUS/TDAP  02/10/2023   Pneumonia Vaccine 72 Years old  Completed   INFLUENZA VACCINE  Completed   DEXA SCAN  Completed   Hepatitis C Screening  Completed   HPV VACCINES  Aged Out   COVID-19 Vaccine  Discontinued    Health Maintenance  There are no preventive care reminders to display for this patient.  Colorectal screening- patient postponed this procedure. Didn't want to get it done yet.   Mammogram status: Completed 2. Repeat every year  Bone Density status: Completed 12/07/2019. Results reflect: Bone density results: OSTEOPENIA. Repeat every n/a years.  Lung Cancer Screening: (Low Dose CT Chest recommended if Age 72-80years, 30 pack-year currently smoking OR have quit w/in 15years.) does not qualify.   Lung Cancer Screening Referral: n/a  Additional Screening:  Hepatitis C Screening: does qualify; Completed  12/27/2015  Vision Screening: Recommended annual ophthalmology exams for early detection of glaucoma and other disorders of the eye. Is the patient up to date with their annual eye exam?  Yes  Who is the provider or what is the name of the office in which the patient attends annual eye exams? Dr. MMarygrace DroughtIf pt is not established with a provider, would they like to be referred to a provider to establish care? Yes .   Dental Screening: Recommended annual dental exams for proper oral hygiene  Community Resource Referral / Chronic Care Management: CRR required this visit?  No   CCM required this visit?  No      Plan:  I have personally reviewed and noted the following in the patient's chart:   Medical and social history Use of alcohol, tobacco or illicit drugs  Current medications and supplements including opioid prescriptions. Patient is not currently taking opioid prescriptions. Functional ability and status Nutritional status Physical activity Advanced directives List of other physicians Hospitalizations, surgeries, and ER visits in previous 12 months Vitals Screenings to include cognitive, depression, and falls Referrals and appointments  In addition, I have reviewed and discussed with patient certain preventive protocols, quality metrics, and best practice recommendations. A written personalized care plan for preventive services as well as general preventive health recommendations were provided to patient.     Mylinda Latina, [place]   12/11/2021  Non face to face 45 minutes  Nurse Notes:  Ms. Barz , Thank you for taking time to come for your Medicare Wellness Visit. I appreciate your ongoing commitment to your health goals. Please review the following plan we discussed and let me know if I can assist you in the future.   These are the goals we discussed:  Goals      Manage My Medicine     Timeframe:  Long-Range Goal Priority:  Medium Start Date:      06/29/20                        Expected End Date:    05/12/2022               Follow Up Date 11/09/2021   - call for medicine refill 2 or 3 days before it runs out - call if I am sick and can't take my medicine - keep a list of all the medicines I take; vitamins and herbals too  - Start using exercise bike - 1 min per day is better than nothing!   Why is this important?   These steps will help you keep on track with your medicines.   Notes:      Patient Stated     Lose weight by decreasing the amount of junk food I eat. I plan to start to do chair and balance exercises. Get up and do more around the house.     Reduce fat intake to X grams per day     Keep trying to improve her diet;  Lowered fat overall          This is a list of the screening recommended for you and due dates:  Health Maintenance  Topic Date Due   Zoster (Shingles) Vaccine (1 of 2) 03/12/2022*   Colon Cancer Screening  06/11/2022*   Mammogram  12/13/2022   Tetanus Vaccine  02/10/2023   Pneumonia Vaccine  Completed   Flu Shot  Completed   DEXA scan (bone density measurement)  Completed   Hepatitis C Screening: USPSTF Recommendation to screen - Ages 18-79 yo.  Completed   HPV Vaccine  Aged Out   COVID-19 Vaccine  Discontinued  *Topic was postponed. The date shown is not the original due date.

## 2021-12-30 ENCOUNTER — Ambulatory Visit
Admission: RE | Admit: 2021-12-30 | Discharge: 2021-12-30 | Disposition: A | Discharge status: Home / Self Care | Payer: Medicare Other | Source: Ambulatory Visit | Attending: Internal Medicine | Admitting: Internal Medicine

## 2021-12-30 DIAGNOSIS — R634 Abnormal weight loss: Secondary | ICD-10-CM | POA: Diagnosis not present

## 2021-12-30 DIAGNOSIS — N289 Disorder of kidney and ureter, unspecified: Secondary | ICD-10-CM | POA: Diagnosis not present

## 2021-12-30 DIAGNOSIS — R10819 Abdominal tenderness, unspecified site: Secondary | ICD-10-CM

## 2021-12-30 DIAGNOSIS — R109 Unspecified abdominal pain: Secondary | ICD-10-CM | POA: Diagnosis not present

## 2021-12-30 DIAGNOSIS — D892 Hypergammaglobulinemia, unspecified: Secondary | ICD-10-CM

## 2022-01-05 ENCOUNTER — Encounter: Payer: Self-pay | Admitting: Internal Medicine

## 2022-01-23 ENCOUNTER — Other Ambulatory Visit: Payer: Self-pay | Admitting: Internal Medicine

## 2022-01-23 DIAGNOSIS — F411 Generalized anxiety disorder: Secondary | ICD-10-CM

## 2022-02-09 ENCOUNTER — Telehealth: Payer: Medicare Other

## 2022-02-15 ENCOUNTER — Other Ambulatory Visit: Payer: Self-pay | Admitting: Internal Medicine

## 2022-02-15 DIAGNOSIS — I10 Essential (primary) hypertension: Secondary | ICD-10-CM

## 2022-03-13 ENCOUNTER — Encounter: Payer: Self-pay | Admitting: Internal Medicine

## 2022-03-13 ENCOUNTER — Ambulatory Visit (INDEPENDENT_AMBULATORY_CARE_PROVIDER_SITE_OTHER): Payer: Medicare Other | Admitting: Internal Medicine

## 2022-03-13 VITALS — BP 144/84 | HR 65 | Temp 97.8°F | Resp 16 | Ht 62.0 in | Wt 206.0 lb

## 2022-03-13 DIAGNOSIS — I10 Essential (primary) hypertension: Secondary | ICD-10-CM | POA: Diagnosis not present

## 2022-03-13 DIAGNOSIS — E876 Hypokalemia: Secondary | ICD-10-CM | POA: Diagnosis not present

## 2022-03-13 DIAGNOSIS — T502X5A Adverse effect of carbonic-anhydrase inhibitors, benzothiadiazides and other diuretics, initial encounter: Secondary | ICD-10-CM | POA: Diagnosis not present

## 2022-03-13 LAB — BASIC METABOLIC PANEL
BUN: 12 mg/dL (ref 6–23)
CO2: 27 mEq/L (ref 19–32)
Calcium: 9.9 mg/dL (ref 8.4–10.5)
Chloride: 101 mEq/L (ref 96–112)
Creatinine, Ser: 0.72 mg/dL (ref 0.40–1.20)
GFR: 83.63 mL/min (ref >60.00)
Glucose, Bld: 142 mg/dL — ABNORMAL HIGH (ref 70–99)
Potassium: 3.5 mEq/L (ref 3.5–5.1)
Sodium: 139 mEq/L (ref 135–145)

## 2022-03-13 LAB — MAGNESIUM: Magnesium: 1.8 mg/dL (ref 1.5–2.5)

## 2022-03-13 NOTE — Patient Instructions (Signed)
Hypertension, Adult High blood pressure (hypertension) is when the force of blood pumping through the arteries is too strong. The arteries are the blood vessels that carry blood from the heart throughout the body. Hypertension forces the heart to work harder to pump blood and may cause arteries to become narrow or stiff. Untreated or uncontrolled hypertension can lead to a heart attack, heart failure, a stroke, kidney disease, and other problems. A blood pressure reading consists of a higher number over a lower number. Ideally, your blood pressure should be below 120/80. The first ("top") number is called the systolic pressure. It is a measure of the pressure in your arteries as your heart beats. The second ("bottom") number is called the diastolic pressure. It is a measure of the pressure in your arteries as the heart relaxes. What are the causes? The exact cause of this condition is not known. There are some conditions that result in high blood pressure. What increases the risk? Certain factors may make you more likely to develop high blood pressure. Some of these risk factors are under your control, including: Smoking. Not getting enough exercise or physical activity. Being overweight. Having too much fat, sugar, calories, or salt (sodium) in your diet. Drinking too much alcohol. Other risk factors include: Having a personal history of heart disease, diabetes, high cholesterol, or kidney disease. Stress. Having a family history of high blood pressure and high cholesterol. Having obstructive sleep apnea. Age. The risk increases with age. What are the signs or symptoms? High blood pressure may not cause symptoms. Very high blood pressure (hypertensive crisis) may cause: Headache. Fast or irregular heartbeats (palpitations). Shortness of breath. Nosebleed. Nausea and vomiting. Vision changes. Severe chest pain, dizziness, and seizures. How is this diagnosed? This condition is diagnosed by  measuring your blood pressure while you are seated, with your arm resting on a flat surface, your legs uncrossed, and your feet flat on the floor. The cuff of the blood pressure monitor will be placed directly against the skin of your upper arm at the level of your heart. Blood pressure should be measured at least twice using the same arm. Certain conditions can cause a difference in blood pressure between your right and left arms. If you have a high blood pressure reading during one visit or you have normal blood pressure with other risk factors, you may be asked to: Return on a different day to have your blood pressure checked again. Monitor your blood pressure at home for 1 week or longer. If you are diagnosed with hypertension, you may have other blood or imaging tests to help your health care provider understand your overall risk for other conditions. How is this treated? This condition is treated by making healthy lifestyle changes, such as eating healthy foods, exercising more, and reducing your alcohol intake. You may be referred for counseling on a healthy diet and physical activity. Your health care provider may prescribe medicine if lifestyle changes are not enough to get your blood pressure under control and if: Your systolic blood pressure is above 130. Your diastolic blood pressure is above 80. Your personal target blood pressure may vary depending on your medical conditions, your age, and other factors. Follow these instructions at home: Eating and drinking  Eat a diet that is high in fiber and potassium, and low in sodium, added sugar, and fat. An example of this eating plan is called the DASH diet. DASH stands for Dietary Approaches to Stop Hypertension. To eat this way: Eat   plenty of fresh fruits and vegetables. Try to fill one half of your plate at each meal with fruits and vegetables. Eat whole grains, such as whole-wheat pasta, brown rice, or whole-grain bread. Fill about one  fourth of your plate with whole grains. Eat or drink low-fat dairy products, such as skim milk or low-fat yogurt. Avoid fatty cuts of meat, processed or cured meats, and poultry with skin. Fill about one fourth of your plate with lean proteins, such as fish, chicken without skin, beans, eggs, or tofu. Avoid pre-made and processed foods. These tend to be higher in sodium, added sugar, and fat. Reduce your daily sodium intake. Many people with hypertension should eat less than 1,500 mg of sodium a day. Do not drink alcohol if: Your health care provider tells you not to drink. You are pregnant, may be pregnant, or are planning to become pregnant. If you drink alcohol: Limit how much you have to: 0-1 drink a day for women. 0-2 drinks a day for men. Know how much alcohol is in your drink. In the [place] one drink equals one 12 oz bottle of beer (355 mL), one 5 oz glass of wine (148 mL), or one 1 oz glass of hard liquor (44 mL). Lifestyle  Work with your health care provider to maintain a healthy body weight or to lose weight. Ask what an ideal weight is for you. Get at least 30 minutes of exercise that causes your heart to beat faster (aerobic exercise) most days of the week. Activities may include walking, swimming, or biking. Include exercise to strengthen your muscles (resistance exercise), such as Pilates or lifting weights, as part of your weekly exercise routine. Try to do these types of exercises for 30 minutes at least 3 days a week. Do not use any products that contain nicotine or tobacco. These products include cigarettes, chewing tobacco, and vaping devices, such as e-cigarettes. If you need help quitting, ask your health care provider. Monitor your blood pressure at home as told by your health care provider. Keep all follow-up visits. This is important. Medicines Take over-the-counter and prescription medicines only as told by your health care provider. Follow directions carefully. Blood  pressure medicines must be taken as prescribed. Do not skip doses of blood pressure medicine. Doing this puts you at risk for problems and can make the medicine less effective. Ask your health care provider about side effects or reactions to medicines that you should watch for. Contact a health care provider if you: Think you are having a reaction to a medicine you are taking. Have headaches that keep coming back (recurring). Feel dizzy. Have swelling in your ankles. Have trouble with your vision. Get help right away if you: Develop a severe headache or confusion. Have unusual weakness or numbness. Feel faint. Have severe pain in your chest or abdomen. Vomit repeatedly. Have trouble breathing. These symptoms may be an emergency. Get help right away. Call 911. Do not wait to see if the symptoms will go away. Do not drive yourself to the hospital. Summary Hypertension is when the force of blood pumping through your arteries is too strong. If this condition is not controlled, it may put you at risk for serious complications. Your personal target blood pressure may vary depending on your medical conditions, your age, and other factors. For most people, a normal blood pressure is less than 120/80. Hypertension is treated with lifestyle changes, medicines, or a combination of both. Lifestyle changes include losing weight, eating a healthy,   low-sodium diet, exercising more, and limiting alcohol. This information is not intended to replace advice given to you by your health care provider. Make sure you discuss any questions you have with your health care provider. Document Revised: 01/10/2021 Document Reviewed: 01/10/2021 Elsevier Patient Education  2023 Elsevier Inc.  

## 2022-03-13 NOTE — Progress Notes (Signed)
Subjective:  Patient ID: Stephanie Pham, female    DOB: 1950-09-06  Age: 71 y.o. MRN: [id number]  CC: Hypertension   HPI Stephanie Pham presents for f/up -  She continues to complain of nonproductive cough and postnasal drip.  I have asked her to start to stop taking the ACE inhibitor but it sounds like she is breaking it in half and still taking it.  She complains of worsening tremors.  She tells me that her legs feel stiff and weak.  Outpatient Medications Prior to Visit  Medication Sig Dispense Refill   acetaminophen (TYLENOL) 500 MG tablet Take 250 mg by mouth 3 (three) times daily as needed.     atenolol (TENORMIN) 50 MG tablet TAKE 1 TABLET BY MOUTH DAILY 100 tablet 1   Cholecalciferol (VITAMIN D3) 50 MCG (2000 UT) CAPS Take 2,000 Units by mouth in the morning and at bedtime.     clorazepate (TRANXENE) 7.5 MG tablet TAKE 1 TABLET BY MOUTH 4 TIMES  DAILY AS NEEDED FOR ANXIETY 400 tablet 1   Krill Oil 300 MG CAPS Take 1,000 mg by mouth daily.     Multiple Vitamins-Minerals (ICAPS AREDS 2 PO) Take 1 capsule by mouth 2 (two) times daily.     potassium chloride SA (KLOR-CON M15) 15 MEQ tablet Take 1 tablet (15 mEq total) by mouth 2 (two) times daily. 180 tablet 1   simvastatin (ZOCOR) 20 MG tablet TAKE 1 TABLET BY MOUTH  DAILY AT 6 PM 100 tablet 1   fosinopril-hydrochlorothiazide (MONOPRIL-HCT) 20-12.5 MG tablet Take 1 tablet by mouth daily.     No facility-administered medications prior to visit.    ROS Review of Systems  Constitutional:  Positive for fatigue. Negative for appetite change, chills, diaphoresis and unexpected weight change.  HENT:  Positive for postnasal drip. Negative for nosebleeds, sinus pressure, sore throat and trouble swallowing.   Respiratory:  Positive for cough. Negative for chest tightness, shortness of breath and wheezing.   Cardiovascular:  Negative for chest pain, palpitations and leg swelling.  Gastrointestinal:  Negative for abdominal pain,  constipation, diarrhea, nausea and vomiting.       + stool urgency  Musculoskeletal: Negative.  Negative for arthralgias and myalgias.  Skin: Negative.  Negative for rash.  Neurological:  Positive for tremors and weakness. Negative for dizziness and light-headedness.  Hematological:  Negative for adenopathy. Does not bruise/bleed easily.  Psychiatric/Behavioral: Negative.      Objective:  BP (!) 144/84 (BP Location: Right Arm, Patient Position: Sitting, Cuff Size: Large)   Pulse 65   Temp 97.8 F (36.6 C) (Oral)   Resp 16   Ht '5\' 2"'$  (1.575 m)   Wt 206 lb (93.4 kg)   SpO2 97%   BMI 37.68 kg/m   BP Readings from Last 3 Encounters:  03/13/22 (!) 144/84  12/06/21 120/84  06/13/21 134/76    Wt Readings from Last 3 Encounters:  03/13/22 206 lb (93.4 kg)  12/11/21 201 lb (91.2 kg)  12/06/21 201 lb (91.2 kg)    Physical Exam Vitals reviewed.  Constitutional:      General: She is not in acute distress.    Appearance: She is ill-appearing (in a wheelchair). She is not toxic-appearing or diaphoretic.  HENT:     Mouth/Throat:     Mouth: Mucous membranes are moist.  Eyes:     General: No scleral icterus.    Conjunctiva/sclera: Conjunctivae normal.  Cardiovascular:     Rate and Rhythm: Normal rate and  regular rhythm.     Heart sounds: No murmur heard. Pulmonary:     Effort: Pulmonary effort is normal.     Breath sounds: No stridor. Rhonchi present. No wheezing or rales.  Abdominal:     General: Abdomen is protuberant. There is no distension.     Palpations: There is no mass.     Tenderness: There is no abdominal tenderness. There is no guarding.     Hernia: No hernia is present.  Musculoskeletal:        General: Normal range of motion.     Cervical back: Neck supple.     Right lower leg: No edema.     Left lower leg: No edema.  Lymphadenopathy:     Cervical: No cervical adenopathy.  Skin:    General: Skin is warm and dry.  Neurological:     Mental Status: She is  alert. Mental status is at baseline.  Psychiatric:        Mood and Affect: Mood normal.        Behavior: Behavior normal.     Lab Results  Component Value Date   WBC 6.7 12/06/2021   HGB 13.6 12/06/2021   HCT 40.2 12/06/2021   PLT 241.0 12/06/2021   GLUCOSE 142 (H) 03/13/2022   CHOL 148 06/13/2021   TRIG 211.0 (H) 06/13/2021   HDL 47.80 06/13/2021   LDLDIRECT 74.0 06/13/2021   ALT 14 12/06/2021   AST 14 12/06/2021   NA 139 03/13/2022   K 3.5 03/13/2022   CL 101 03/13/2022   CREATININE 0.72 03/13/2022   BUN 12 03/13/2022   CO2 27 03/13/2022   TSH 1.79 12/23/2019   INR 1.1 (H) 12/23/2019   HGBA1C 5.7 06/13/2021    MR ABDOMEN WO CONTRAST  Result Date: 01/01/2022 CLINICAL DATA:  Weight loss, unintentional times a few months. Paraproteinemia. Right flank tenderness. EXAM: MRI ABDOMEN WITHOUT CONTRAST TECHNIQUE: Multiplanar multisequence MR imaging was performed without the administration of intravenous contrast. COMPARISON:  CT September 07, 2006. FINDINGS: Lower chest: Elevation of the left hemidiaphragm. Hepatobiliary: No significant hepatic steatosis. No suspicious hepatic lesion on this noncontrast enhanced evaluation. Gallbladder is decompressed or surgically absent. No biliary ductal dilation. Pancreas: Intrinsic T1 signal of the pancreatic parenchyma is within normal limits. No pancreatic ductal dilation. Spleen:  No splenomegaly. Adrenals/Urinary Tract: Bilateral adrenal glands appear normal. No hydronephrosis. Fluid signal subcentimeter left renal lesion is incompletely evaluated without intravenous contrast material but statistically likely reflect a cyst and considered benign requiring no independent imaging follow-up. Stomach/Bowel: Visualized portions within the abdomen are unremarkable. Vascular/Lymphatic: Normal caliber abdominal aorta. No pathologically enlarged abdominal lymph nodes. Other:  No significant abdominal free fluid. Musculoskeletal: No suspicious bone lesions  identified. IMPRESSION: No acute or significant abnormal finding on this noncontrast enhanced MRI of the abdomen. Electronically Signed   By: Dahlia Bailiff M.D.   On: 01/01/2022 14:42    Assessment & Plan:   Zunairah was seen today for hypertension.  Diagnoses and all orders for this visit:  Essential hypertension- Her blood pressure is adequately well-controlled.  I have asked her to remain off of the ACE inhibitor and thiazide diuretic. -     Magnesium; Future -     Basic metabolic panel; Future -     Basic metabolic panel -     Magnesium  Diuretic-induced hypokalemia- Her potassium level is normal now.  I recommended that she not take a thiazide diuretic. -     Magnesium; Future -  Basic metabolic panel; Future -     Basic metabolic panel -     Magnesium   I have discontinued Francis Gaines. Cleaves's fosinopril-hydrochlorothiazide. I am also having her maintain her Krill Oil, acetaminophen, Multiple Vitamins-Minerals (ICAPS AREDS 2 PO), vitamin D3, potassium chloride SA, simvastatin, clorazepate, and atenolol.  No orders of the defined types were placed in this encounter.    Follow-up: Return in about 6 months (around 09/12/2022).  Scarlette Calico, MD

## 2022-03-15 ENCOUNTER — Encounter: Payer: Self-pay | Admitting: Internal Medicine

## 2022-05-01 ENCOUNTER — Other Ambulatory Visit: Payer: Self-pay | Admitting: Internal Medicine

## 2022-05-01 DIAGNOSIS — I10 Essential (primary) hypertension: Secondary | ICD-10-CM

## 2022-05-01 DIAGNOSIS — E876 Hypokalemia: Secondary | ICD-10-CM

## 2022-05-04 ENCOUNTER — Ambulatory Visit: Payer: Medicare Other | Admitting: Internal Medicine

## 2022-05-15 ENCOUNTER — Other Ambulatory Visit: Payer: Self-pay | Admitting: Internal Medicine

## 2022-05-15 DIAGNOSIS — E785 Hyperlipidemia, unspecified: Secondary | ICD-10-CM

## 2022-06-23 ENCOUNTER — Other Ambulatory Visit: Payer: Self-pay | Admitting: Internal Medicine

## 2022-06-23 DIAGNOSIS — E876 Hypokalemia: Secondary | ICD-10-CM

## 2022-06-23 DIAGNOSIS — I10 Essential (primary) hypertension: Secondary | ICD-10-CM

## 2022-06-29 ENCOUNTER — Encounter: Payer: Self-pay | Admitting: Internal Medicine

## 2022-06-29 ENCOUNTER — Other Ambulatory Visit: Payer: Self-pay | Admitting: Internal Medicine

## 2022-06-29 DIAGNOSIS — I10 Essential (primary) hypertension: Secondary | ICD-10-CM

## 2022-06-29 MED ORDER — FOSINOPRIL SODIUM 20 MG PO TABS: 20.0000 mg | ORAL_TABLET | Freq: Every day | ORAL | 0 refills | Status: DC

## 2022-06-29 NOTE — Progress Notes (Unsigned)
Lab Results  Component Value Date   WBC 6.7 12/06/2021   HGB 13.6 12/06/2021   HCT 40.2 12/06/2021   PLT 241.0 12/06/2021   GLUCOSE 142 (H) 03/13/2022   CHOL 148 06/13/2021   TRIG 211.0 (H) 06/13/2021   HDL 47.80 06/13/2021   LDLDIRECT 74.0 06/13/2021   ALT 14 12/06/2021   AST 14 12/06/2021   NA 139 03/13/2022   K 3.5 03/13/2022   CL 101 03/13/2022   CREATININE 0.72 03/13/2022   BUN 12 03/13/2022   CO2 27 03/13/2022   TSH 1.79 12/23/2019   INR 1.1 (H) 12/23/2019   HGBA1C 5.7 06/13/2021

## 2022-07-02 ENCOUNTER — Other Ambulatory Visit: Payer: Self-pay | Admitting: Internal Medicine

## 2022-07-02 DIAGNOSIS — I10 Essential (primary) hypertension: Secondary | ICD-10-CM

## 2022-07-02 MED ORDER — FOSINOPRIL SODIUM 20 MG PO TABS: 20.0000 mg | ORAL_TABLET | Freq: Every day | ORAL | 0 refills | Status: DC

## 2022-07-18 ENCOUNTER — Other Ambulatory Visit: Payer: Self-pay | Admitting: Internal Medicine

## 2022-07-18 DIAGNOSIS — F411 Generalized anxiety disorder: Secondary | ICD-10-CM

## 2022-07-20 ENCOUNTER — Other Ambulatory Visit: Payer: Self-pay | Admitting: Internal Medicine

## 2022-07-20 DIAGNOSIS — I10 Essential (primary) hypertension: Secondary | ICD-10-CM

## 2022-08-26 ENCOUNTER — Other Ambulatory Visit: Payer: Self-pay | Admitting: Internal Medicine

## 2022-08-26 DIAGNOSIS — E876 Hypokalemia: Secondary | ICD-10-CM

## 2022-08-26 DIAGNOSIS — I10 Essential (primary) hypertension: Secondary | ICD-10-CM

## 2022-09-18 ENCOUNTER — Other Ambulatory Visit: Payer: Self-pay | Admitting: Internal Medicine

## 2022-09-18 DIAGNOSIS — I10 Essential (primary) hypertension: Secondary | ICD-10-CM

## 2022-09-19 ENCOUNTER — Other Ambulatory Visit: Payer: Self-pay | Admitting: Internal Medicine

## 2022-09-19 ENCOUNTER — Encounter: Payer: Self-pay | Admitting: Internal Medicine

## 2022-09-19 DIAGNOSIS — I10 Essential (primary) hypertension: Secondary | ICD-10-CM

## 2022-09-19 MED ORDER — FOSINOPRIL SODIUM 20 MG PO TABS: 20.0000 mg | ORAL_TABLET | Freq: Every day | ORAL | 0 refills | Status: DC

## 2022-09-28 ENCOUNTER — Other Ambulatory Visit: Payer: Self-pay | Admitting: Internal Medicine

## 2022-09-28 DIAGNOSIS — I10 Essential (primary) hypertension: Secondary | ICD-10-CM

## 2022-10-13 ENCOUNTER — Other Ambulatory Visit: Payer: Self-pay | Admitting: Internal Medicine

## 2022-10-13 DIAGNOSIS — E785 Hyperlipidemia, unspecified: Secondary | ICD-10-CM

## 2022-10-21 ENCOUNTER — Other Ambulatory Visit: Payer: Self-pay | Admitting: Internal Medicine

## 2022-10-21 DIAGNOSIS — I10 Essential (primary) hypertension: Secondary | ICD-10-CM

## 2022-10-21 DIAGNOSIS — E876 Hypokalemia: Secondary | ICD-10-CM

## 2022-11-01 ENCOUNTER — Encounter (INDEPENDENT_AMBULATORY_CARE_PROVIDER_SITE_OTHER): Payer: Self-pay

## 2022-11-09 ENCOUNTER — Other Ambulatory Visit: Payer: Self-pay | Admitting: Internal Medicine

## 2022-11-09 DIAGNOSIS — F411 Generalized anxiety disorder: Secondary | ICD-10-CM

## 2022-11-09 MED ORDER — CLORAZEPATE DIPOTASSIUM 7.5 MG PO TABS
7.5000 mg | ORAL_TABLET | Freq: Four times a day (QID) | ORAL | 0 refills | Status: DC
Start: 2022-11-09 — End: 2023-02-11

## 2022-11-20 ENCOUNTER — Other Ambulatory Visit: Payer: Self-pay | Admitting: Internal Medicine

## 2022-11-20 DIAGNOSIS — I10 Essential (primary) hypertension: Secondary | ICD-10-CM

## 2022-11-22 ENCOUNTER — Ambulatory Visit (INDEPENDENT_AMBULATORY_CARE_PROVIDER_SITE_OTHER): Payer: Medicare Other

## 2022-11-22 ENCOUNTER — Ambulatory Visit (INDEPENDENT_AMBULATORY_CARE_PROVIDER_SITE_OTHER): Payer: Medicare Other | Admitting: Internal Medicine

## 2022-11-22 ENCOUNTER — Encounter: Payer: Self-pay | Admitting: Internal Medicine

## 2022-11-22 VITALS — BP 144/86 | HR 60 | Temp 98.1°F | Ht 62.0 in

## 2022-11-22 DIAGNOSIS — E876 Hypokalemia: Secondary | ICD-10-CM | POA: Diagnosis not present

## 2022-11-22 DIAGNOSIS — I10 Essential (primary) hypertension: Secondary | ICD-10-CM | POA: Diagnosis not present

## 2022-11-22 DIAGNOSIS — Z0000 Encounter for general adult medical examination without abnormal findings: Secondary | ICD-10-CM | POA: Diagnosis not present

## 2022-11-22 DIAGNOSIS — E781 Pure hyperglyceridemia: Secondary | ICD-10-CM | POA: Diagnosis not present

## 2022-11-22 DIAGNOSIS — M810 Age-related osteoporosis without current pathological fracture: Secondary | ICD-10-CM | POA: Diagnosis not present

## 2022-11-22 DIAGNOSIS — R739 Hyperglycemia, unspecified: Secondary | ICD-10-CM

## 2022-11-22 DIAGNOSIS — Z0001 Encounter for general adult medical examination with abnormal findings: Secondary | ICD-10-CM

## 2022-11-22 DIAGNOSIS — E785 Hyperlipidemia, unspecified: Secondary | ICD-10-CM | POA: Diagnosis not present

## 2022-11-22 DIAGNOSIS — T502X5A Adverse effect of carbonic-anhydrase inhibitors, benzothiadiazides and other diuretics, initial encounter: Secondary | ICD-10-CM

## 2022-11-22 DIAGNOSIS — Z1231 Encounter for screening mammogram for malignant neoplasm of breast: Secondary | ICD-10-CM

## 2022-11-22 DIAGNOSIS — Z23 Encounter for immunization: Secondary | ICD-10-CM | POA: Insufficient documentation

## 2022-11-22 LAB — HEPATIC FUNCTION PANEL
ALT: 15 U/L (ref 0–35)
AST: 16 U/L (ref 0–37)
Albumin: 4 g/dL (ref 3.5–5.2)
Alkaline Phosphatase: 59 U/L (ref 39–117)
Bilirubin, Direct: 0.2 mg/dL (ref 0.0–0.3)
Total Bilirubin: 0.7 mg/dL (ref 0.2–1.2)
Total Protein: 7 g/dL (ref 6.0–8.3)

## 2022-11-22 LAB — URINALYSIS, ROUTINE W REFLEX MICROSCOPIC
Bilirubin Urine: NEGATIVE
Hgb urine dipstick: NEGATIVE
Ketones, ur: NEGATIVE
Leukocytes,Ua: NEGATIVE
Nitrite: NEGATIVE
Specific Gravity, Urine: 1.025 (ref 1.000–1.030)
Urine Glucose: NEGATIVE
Urobilinogen, UA: 0.2 (ref 0.0–1.0)
pH: 6 (ref 5.0–8.0)

## 2022-11-22 LAB — PHOSPHORUS: Phosphorus: 2.7 mg/dL (ref 2.3–4.6)

## 2022-11-22 LAB — CBC WITH DIFFERENTIAL/PLATELET
Basophils Absolute: 0 10*3/uL (ref 0.0–0.1)
Basophils Relative: 0.5 % (ref 0.0–3.0)
Eosinophils Absolute: 0 10*3/uL (ref 0.0–0.7)
Eosinophils Relative: 0.4 % (ref 0.0–5.0)
HCT: 42.7 % (ref 36.0–46.0)
Hemoglobin: 13.8 g/dL (ref 12.0–15.0)
Lymphocytes Relative: 13 % (ref 12.0–46.0)
Lymphs Abs: 0.8 10*3/uL (ref 0.7–4.0)
MCHC: 32.4 g/dL (ref 30.0–36.0)
MCV: 95.9 fl (ref 78.0–100.0)
Monocytes Absolute: 0.4 10*3/uL (ref 0.1–1.0)
Monocytes Relative: 5.8 % (ref 3.0–12.0)
Neutro Abs: 4.9 10*3/uL (ref 1.4–7.7)
Neutrophils Relative %: 80.3 % — ABNORMAL HIGH (ref 43.0–77.0)
Platelets: 248 10*3/uL (ref 150.0–400.0)
RBC: 4.45 Mil/uL (ref 3.87–5.11)
RDW: 13.6 % (ref 11.5–15.5)
WBC: 6.1 10*3/uL (ref 4.0–10.5)

## 2022-11-22 LAB — BASIC METABOLIC PANEL
BUN: 10 mg/dL (ref 6–23)
CO2: 28 meq/L (ref 19–32)
Calcium: 9.2 mg/dL (ref 8.4–10.5)
Chloride: 103 meq/L (ref 96–112)
Creatinine, Ser: 0.59 mg/dL (ref 0.40–1.20)
GFR: 89.7 mL/min (ref >60.00)
Glucose, Bld: 139 mg/dL — ABNORMAL HIGH (ref 70–99)
Potassium: 3.4 meq/L — ABNORMAL LOW (ref 3.5–5.1)
Sodium: 140 meq/L (ref 135–145)

## 2022-11-22 LAB — LIPID PANEL
Cholesterol: 136 mg/dL (ref 0–200)
HDL: 54.9 mg/dL (ref >39.00)
LDL Cholesterol: 59 mg/dL (ref 0–99)
NonHDL: 80.6
Total CHOL/HDL Ratio: 2
Triglycerides: 109 mg/dL (ref 0.0–149.0)
VLDL: 21.8 mg/dL (ref 0.0–40.0)

## 2022-11-22 LAB — TSH: TSH: 0.9 u[IU]/mL (ref 0.35–5.50)

## 2022-11-22 LAB — HEMOGLOBIN A1C: Hgb A1c MFr Bld: 5.4 % (ref 4.6–6.5)

## 2022-11-22 LAB — VITAMIN D 25 HYDROXY (VIT D DEFICIENCY, FRACTURES): VITD: 42.8 ng/mL (ref 30.00–100.00)

## 2022-11-22 NOTE — Patient Instructions (Signed)

## 2022-11-22 NOTE — Progress Notes (Signed)
Subjective:  Patient ID: Stephanie Pham, female    DOB: December 05, 1950  Age: 72 y.o. MRN: [id number]  CC: Annual Exam, Hypertension, and Hyperlipidemia   HPI Stephanie Pham presents for a CPX and f/up ----  Discussed the use of AI scribe software for clinical note transcription with the patient, who gave verbal consent to proceed.  History of Present Illness   The patient presents with a primary complaint of persistent tremors, which she describes as "awful." The tremors appear to be exacerbated by nervousness and stress, particularly when leaving the house, to the point where the patient describes herself as "almost housebound." The patient's anxiety is so severe that she was unsure she would be able to attend the current appointment. Despite taking Atenolol, the patient reports that the medication only provides partial relief and the tremors persist, particularly in situations that cause nervousness.  The patient denies experiencing chest pain or shortness of breath. She declined vaccination against shingles.       Outpatient Medications Prior to Visit  Medication Sig Dispense Refill   acetaminophen (TYLENOL) 500 MG tablet Take 250 mg by mouth 3 (three) times daily as needed.     atenolol (TENORMIN) 50 MG tablet TAKE 1 TABLET BY MOUTH DAILY 100 tablet 0   Cholecalciferol (VITAMIN D3) 50 MCG (2000 UT) CAPS Take 2,000 Units by mouth in the morning and at bedtime.     clorazepate (TRANXENE) 7.5 MG tablet Take 1 tablet (7.5 mg total) by mouth 4 (four) times daily. 400 tablet 0   fosinopril (MONOPRIL) 20 MG tablet TAKE 1 TABLET BY MOUTH EVERY DAY 90 tablet 0   Krill Oil 300 MG CAPS Take 1,000 mg by mouth daily.     Multiple Vitamins-Minerals (ICAPS AREDS 2 PO) Take 1 capsule by mouth 2 (two) times daily.     KLOR-CON M15 15 MEQ TBCR TAKE 1 TABLET BY MOUTH TWICE  DAILY 160 tablet 0   simvastatin (ZOCOR) 20 MG tablet TAKE 1 TABLET BY MOUTH DAILY AT  6 PM 100 tablet 0   No  facility-administered medications prior to visit.    ROS Review of Systems  Constitutional:  Positive for fatigue. Negative for appetite change, diaphoresis and unexpected weight change.  HENT: Negative.    Eyes: Negative.   Respiratory: Negative.  Negative for chest tightness, shortness of breath and wheezing.   Cardiovascular:  Negative for chest pain, palpitations and leg swelling.  Gastrointestinal: Negative.  Negative for abdominal pain, constipation, diarrhea, nausea and vomiting.  Endocrine: Negative.   Genitourinary: Negative.  Negative for difficulty urinating.  Musculoskeletal:  Positive for gait problem. Negative for myalgias.  Skin: Negative.   Neurological:  Positive for tremors and weakness. Negative for dizziness and light-headedness.  Hematological:  Negative for adenopathy. Does not bruise/bleed easily.  Psychiatric/Behavioral:  The patient is nervous/anxious.     Objective:  BP (!) 144/86 (BP Location: Left Arm, Patient Position: Sitting, Cuff Size: Large)   Pulse 60   Temp 98.1 F (36.7 C) (Oral)   Ht 5\' 2"  (1.575 m)   SpO2 98%   BMI 37.68 kg/m   BP Readings from Last 3 Encounters:  11/22/22 (!) 144/86  03/13/22 (!) 144/84  12/06/21 120/84    Wt Readings from Last 3 Encounters:  03/13/22 206 lb (93.4 kg)  12/11/21 201 lb (91.2 kg)  12/06/21 201 lb (91.2 kg)    Physical Exam Vitals reviewed.  Constitutional:      General: She is not in acute  distress.    Appearance: She is obese. She is ill-appearing. She is not toxic-appearing or diaphoretic.  HENT:     Nose: Nose normal.     Mouth/Throat:     Mouth: Mucous membranes are moist.  Eyes:     General: No scleral icterus.    Conjunctiva/sclera: Conjunctivae normal.  Cardiovascular:     Rate and Rhythm: Normal rate and regular rhythm.     Heart sounds: No murmur heard.    No friction rub. No gallop.  Pulmonary:     Effort: Pulmonary effort is normal.     Breath sounds: No stridor. No wheezing,  rhonchi or rales.  Abdominal:     General: Abdomen is protuberant. Bowel sounds are normal.     Palpations: There is no hepatomegaly, splenomegaly or mass.     Tenderness: There is no abdominal tenderness. There is no guarding.  Musculoskeletal:        General: Normal range of motion.     Cervical back: Neck supple.     Right lower leg: No edema.     Left lower leg: No edema.  Skin:    General: Skin is warm and dry.     Findings: No rash.  Neurological:     General: No focal deficit present.     Mental Status: She is alert.     Motor: Weakness and tremor present.     Comments: In a wheechair  Psychiatric:        Attention and Perception: She is inattentive.        Mood and Affect: Mood is anxious. Mood is not depressed. Affect is not flat.        Speech: Speech normal.        Behavior: Behavior normal.        Cognition and Memory: Cognition is impaired. Memory is impaired.     Lab Results  Component Value Date   WBC 6.1 11/22/2022   HGB 13.8 11/22/2022   HCT 42.7 11/22/2022   PLT 248.0 11/22/2022   GLUCOSE 139 (H) 11/22/2022   CHOL 136 11/22/2022   TRIG 109.0 11/22/2022   HDL 54.90 11/22/2022   LDLDIRECT 74.0 06/13/2021   LDLCALC 59 11/22/2022   ALT 15 11/22/2022   AST 16 11/22/2022   NA 140 11/22/2022   K 3.4 (L) 11/22/2022   CL 103 11/22/2022   CREATININE 0.59 11/22/2022   BUN 10 11/22/2022   CO2 28 11/22/2022   TSH 0.90 11/22/2022   INR 1.1 (H) 12/23/2019   HGBA1C 5.4 11/22/2022    MR ABDOMEN WO CONTRAST  Result Date: 01/01/2022 CLINICAL DATA:  Weight loss, unintentional times a few months. Paraproteinemia. Right flank tenderness. EXAM: MRI ABDOMEN WITHOUT CONTRAST TECHNIQUE: Multiplanar multisequence MR imaging was performed without the administration of intravenous contrast. COMPARISON:  CT September 07, 2006. FINDINGS: Lower chest: Elevation of the left hemidiaphragm. Hepatobiliary: No significant hepatic steatosis. No suspicious hepatic lesion on this  noncontrast enhanced evaluation. Gallbladder is decompressed or surgically absent. No biliary ductal dilation. Pancreas: Intrinsic T1 signal of the pancreatic parenchyma is within normal limits. No pancreatic ductal dilation. Spleen:  No splenomegaly. Adrenals/Urinary Tract: Bilateral adrenal glands appear normal. No hydronephrosis. Fluid signal subcentimeter left renal lesion is incompletely evaluated without intravenous contrast material but statistically likely reflect a cyst and considered benign requiring no independent imaging follow-up. Stomach/Bowel: Visualized portions within the abdomen are unremarkable. Vascular/Lymphatic: Normal caliber abdominal aorta. No pathologically enlarged abdominal lymph nodes. Other:  No significant abdominal free  fluid. Musculoskeletal: No suspicious bone lesions identified. IMPRESSION: No acute or significant abnormal finding on this noncontrast enhanced MRI of the abdomen. Electronically Signed   By: Maudry Mayhew M.D.   On: 01/01/2022 14:42    Assessment & Plan:  Senile osteoporosis -     Basic metabolic panel; Future -     VITAMIN D 25 Hydroxy (Vit-D Deficiency, Fractures); Future -     Phosphorus; Future  Essential hypertension- Her BP is well controlled. -     CBC with Differential/Platelet; Future -     TSH; Future -     Urinalysis, Routine w reflex microscopic; Future  Hyperlipidemia with target LDL less than 130 - LDL goal achieved. Doing well on the statin  -     Lipid panel; Future -     Hepatic function panel; Future -     TSH; Future  Hypertriglyceridemia -     Lipid panel; Future  Encounter for general adult medical examination with abnormal findings - Exam completed, labs reviewed, vaccines reviewed and updated, cancer screenings addressed, pt ed material was given.   Flu vaccine need -     Flu Vaccine Trivalent High Dose (Fluad)  Screening mammogram for breast cancer -     Digital Screening Mammogram, Left and Right;  Future  Chronic hyperglycemia -     Hemoglobin A1c; Future     Follow-up: Return in about 6 months (around 05/22/2023).  Sanda Linger, MD

## 2022-11-23 MED ORDER — SIMVASTATIN 20 MG PO TABS
20.0000 mg | ORAL_TABLET | Freq: Every day | ORAL | 1 refills | Status: DC
Start: 2022-11-23 — End: 2023-06-27

## 2022-11-23 MED ORDER — POTASSIUM CHLORIDE ER 15 MEQ PO TBCR
1.0000 | EXTENDED_RELEASE_TABLET | Freq: Two times a day (BID) | ORAL | 1 refills | Status: DC
Start: 2022-11-23 — End: 2023-03-21

## 2022-12-07 ENCOUNTER — Other Ambulatory Visit: Payer: Self-pay | Admitting: Internal Medicine

## 2022-12-07 DIAGNOSIS — I10 Essential (primary) hypertension: Secondary | ICD-10-CM

## 2022-12-08 ENCOUNTER — Other Ambulatory Visit: Payer: Self-pay | Admitting: Internal Medicine

## 2022-12-08 DIAGNOSIS — I10 Essential (primary) hypertension: Secondary | ICD-10-CM

## 2022-12-10 MED ORDER — FOSINOPRIL SODIUM 20 MG PO TABS
20.0000 mg | ORAL_TABLET | Freq: Every day | ORAL | 1 refills | Status: DC
Start: 2022-12-10 — End: 2023-06-27

## 2022-12-20 ENCOUNTER — Telehealth: Payer: Self-pay

## 2022-12-20 NOTE — Telephone Encounter (Signed)
Unsuccessful attempts to reach patient on preferred numbers listed in notes for scheduled AWV. Left message on voicemail okay to reschedule

## 2022-12-24 ENCOUNTER — Ambulatory Visit (INDEPENDENT_AMBULATORY_CARE_PROVIDER_SITE_OTHER): Payer: Medicare Other

## 2022-12-24 VITALS — BP 120/70 | HR 60 | Temp 98.1°F | Ht 62.0 in | Wt 206.0 lb

## 2022-12-24 DIAGNOSIS — Z0000 Encounter for general adult medical examination without abnormal findings: Secondary | ICD-10-CM | POA: Diagnosis not present

## 2022-12-24 NOTE — Progress Notes (Signed)
Subjective:   Stephanie Pham is a 72 y.o. female who presents for Medicare Annual (Subsequent) preventive examination.  Visit Complete: Virtual  I connected with  Stephanie Pham on 12/24/22 by a audio enabled telemedicine application and verified that I am speaking with the correct person using two identifiers.  Patient Location: Home  Provider Location: Office/Clinic  I discussed the limitations of evaluation and management by telemedicine. The patient expressed understanding and agreed to proceed.  Patient Medicare AWV questionnaire was completed by the patient on N/A; I have confirmed that all information answered by patient is correct and no changes since this date.  Cardiac Risk Factors include: advanced age (>62men, >9 women);dyslipidemia;family history of premature cardiovascular disease;hypertension;obesity (BMI >30kg/m2);sedentary lifestyle     Objective:    Today's Vitals   12/24/22 1041 12/24/22 1043  BP: 120/70   Pulse: 60   Temp: 98.1 F (36.7 C)   SpO2: 98%   Weight: 206 lb (93.4 kg)   Height: 5\' 2"  (1.575 m)   PainSc:  7    Body mass index is 37.68 kg/m. Patient self reported BP but rest of vitals were taken from last office visit. Weight was taken from last 2023 visit has it was not documented in last set of notes.      12/24/2022   10:48 AM 12/11/2021   10:40 AM 12/09/2020   10:06 AM 12/08/2019    9:56 AM 11/27/2018   10:35 AM 11/22/2017    9:52 AM 11/21/2016    9:35 AM  Advanced Directives  Does Patient Have a Medical Advance Directive? No No No No No No No  Does patient want to make changes to medical advance directive?      Yes (ED - Information included in AVS)   Would patient like information on creating a medical advance directive? No - Patient declined No - Patient declined No - Patient declined No - Patient declined No - Patient declined  Yes (ED - Information included in AVS)    Current Medications (verified) Outpatient Encounter Medications  as of 12/24/2022  Medication Sig   acetaminophen (TYLENOL) 500 MG tablet Take 250 mg by mouth 3 (three) times daily as needed.   atenolol (TENORMIN) 50 MG tablet TAKE 1 TABLET BY MOUTH DAILY   Cholecalciferol (VITAMIN D3) 50 MCG (2000 UT) CAPS Take 2,000 Units by mouth in the morning and at bedtime.   clorazepate (TRANXENE) 7.5 MG tablet Take 1 tablet (7.5 mg total) by mouth 4 (four) times daily.   fosinopril (MONOPRIL) 20 MG tablet Take 1 tablet (20 mg total) by mouth daily.   Krill Oil 300 MG CAPS Take 1,000 mg by mouth daily.   Multiple Vitamins-Minerals (ICAPS AREDS 2 PO) Take 1 capsule by mouth 2 (two) times daily.   Potassium Chloride ER (KLOR-CON M15) 15 MEQ TBCR Take 1 tablet (15 mEq total) by mouth 2 (two) times daily.   simvastatin (ZOCOR) 20 MG tablet Take 1 tablet (20 mg total) by mouth daily at 6 PM. TAKE 1 TABLET BY MOUTH  DAILY AT 6 PM   No facility-administered encounter medications on file as of 12/24/2022.    Allergies (verified) Codeine, Iohexol, Ivp dye [iodinated contrast media], Minocycline, and Sulfonamide derivatives   History: Past Medical History:  Diagnosis Date   Allergy    Anemia    Blood transfusion without reported diagnosis 1976   Cancer (HCC)    colon cancer 1976   Cataract    both eyes-surgery  Chronic anxiety    Chronic depression    Diverticulosis of colon    patient said it may have been diverticulitis on cipro for a long period   Dyslipidemia    Hemorrhoids    Hyperlipidemia    Hypertension    Internal hemorrhoid    Macular degeneration    Osteoporosis    neck and hip osteopenia   Past Surgical History:  Procedure Laterality Date   ABDOMINAL HYSTERECTOMY     CATARACT EXTRACTION     left eye 05-13-2013   CHOLECYSTECTOMY     COLON SURGERY  2008   COLONOSCOPY     laparotomy and removal of polyp     sigmoid colectomy     Family History  Problem Relation Age of Onset   Fibromyalgia Mother    Hypertension Mother    Other Mother         "hallucinations" and fibromyalgia   Ovarian cysts Mother        "growths"   Heart disease Father    Diabetes Father    Heart attack Father 67       d. due to 2nd heart attack at 50   Breast cancer Maternal Aunt        dx 63s; s/p mastectomy   Other Maternal Aunt        hx of colon surgery in her 68s, lim info   COPD Brother    Heart attack Brother    Kidney disease Brother        smoker; d. 96   Bipolar disorder Brother    Skin cancer Brother        described as a "black mole"   Diabetes Brother    Endometriosis Daughter    Other Daughter        hx of breast biopsies   Diabetes Paternal Aunt    AAA (abdominal aortic aneurysm) Paternal Aunt    Stroke Maternal Grandfather    Other Maternal Grandfather        had an operation on his intestines to address constipation   Stroke Paternal Aunt    Stroke Paternal Aunt    Diabetes Paternal Uncle    Cancer Cousin        d. NOS cancer (maybe liver or lung cancer); +EtOH   Diabetes Paternal Grandmother    Other Paternal Grandmother        intestinal issues   Stroke Paternal Grandfather    Bipolar disorder Other    Colon cancer Neg Hx    Rectal cancer Neg Hx    Stomach cancer Neg Hx    Esophageal cancer Neg Hx    Colon polyps Neg Hx    Social History   Socioeconomic History   Marital status: Married    Spouse name: Not on file   Number of children: 3   Years of education: Not on file   Highest education level: Not on file  Occupational History   Occupation: Retired  Tobacco Use   Smoking status: Never   Smokeless tobacco: Never  Vaping Use   Vaping status: Never Used  Substance and Sexual Activity   Alcohol use: No    Alcohol/week: 0.0 standard drinks of alcohol   Drug use: No   Sexual activity: Not Currently  Other Topics Concern   Not on file  Social History Narrative   Married 1968   2 sons, 1 daughter lost one son in MVA   Full-time homemaker   Social Determinants of Health  Financial Resource  Strain: Low Risk  (12/24/2022)   Overall Financial Resource Strain (CARDIA)    Difficulty of Paying Living Expenses: Not hard at all  Food Insecurity: No Food Insecurity (12/24/2022)   Hunger Vital Sign    Worried About Running Out of Food in the Last Year: Never true    Ran Out of Food in the Last Year: Never true  Transportation Needs: No Transportation Needs (12/24/2022)   PRAPARE - Administrator, Civil Service (Medical): No    Lack of Transportation (Non-Medical): No  Physical Activity: Inactive (12/11/2021)   Exercise Vital Sign    Days of Exercise per Week: 0 days    Minutes of Exercise per Session: 0 min  Stress: Stress Concern Present (12/24/2022)   [employer] of Occupational Health - Occupational Stress Questionnaire    Feeling of Stress : Rather much  Social Connections: Moderately Isolated (12/24/2022)   Social Connection and Isolation Panel [NHANES]    Frequency of Communication with Friends and Family: More than three times a week    Frequency of Social Gatherings with Friends and Family: Never    Attends Religious Services: Never    Database administrator or Organizations: No    Attends Engineer, structural: Never    Marital Status: Married    Tobacco Counseling Counseling given: Not Answered   Clinical Intake:  Pre-visit preparation completed: Yes  Pain : 0-10 Pain Score: 7  Pain Type: Chronic pain, Neuropathic pain Pain Location: Other (Comment) (tremors in neck, hands and legs) Pain Onset: More than a month ago Pain Frequency: Constant Effect of Pain on Daily Activities: not able to easily eat or cook     BMI - recorded: 37 Nutritional Status: BMI > 30  Obese Nutritional Risks: None Diabetes: No  How often do you need to have someone help you when you read instructions, pamphlets, or other written materials from your doctor or pharmacy?: 1 - Never What is the last grade level you completed in school?: 12th grade  Interpreter  Needed?: No  Information entered by :: Elyse Jarvis, CMA   Activities of Daily Living    12/24/2022   10:51 AM  In your present state of health, do you have any difficulty performing the following activities:  Hearing? 0  Vision? 0  Difficulty concentrating or making decisions? 0  Walking or climbing stairs? 0  Dressing or bathing? 0  Doing errands, shopping? 0  Preparing Food and eating ? Y  Using the Toilet? N  In the past six months, have you accidently leaked urine? Y  Do you have problems with loss of bowel control? Y  Managing your Medications? N  Managing your Finances? N  Housekeeping or managing your Housekeeping? N    Patient Care Team: Etta Grandchild, MD as PCP - General (Internal Medicine) Pricilla Riffle, MD as PCP - Cardiology (Cardiology) Cherlyn Roberts, MD as Consulting Physician (Dermatology) Janet Berlin, MD as Consulting Physician (Ophthalmology) Erlene Quan, Vinnie Level, [facility] (Inactive) (Pharmacist)  Indicate any recent Medical Services you may have received from other than [facility] providers in the past year (date may be approximate).     Assessment:   This is a routine wellness examination for Lashawnta.  Hearing/Vision screen Patient denied any hearing difficulty. No hearing aids. Patient does wear corrective lenses.   Goals Addressed               This Visit's Progress     Patient Stated (  pt-stated)        I would like to work on getting out of the house and doing better overall with my health and tremors.        Depression Screen    12/24/2022   10:51 AM 12/11/2021   10:38 AM 12/09/2020   10:20 AM 12/08/2020    9:31 AM 12/08/2019   10:26 AM 11/27/2018   10:35 AM 11/22/2017    9:56 AM  PHQ 2/9 Scores  PHQ - 2 Score 2 1 0 0 0 2 1  PHQ- 9 Score 6     4 3     Fall Risk    12/24/2022   10:51 AM 12/11/2021   10:35 AM 12/09/2020   10:10 AM 12/08/2020    9:31 AM 12/08/2019   11:01 AM  Fall Risk   Falls in the past year? 0 0 0 0 0  Number falls  in past yr: 0 0 0  0  Injury with Fall? 0 0 0  0  Risk for fall due to : History of fall(s);Impaired mobility No Fall Risks No Fall Risks  No Fall Risks  Follow up Falls evaluation completed Falls evaluation completed Falls evaluation completed  Falls evaluation completed;Education provided    MEDICARE RISK AT HOME:    TIMED UP AND GO:  Was the test performed?  No    Cognitive Function: Patient is cogitatively intact.    10/31/2015    9:56 AM  MMSE - Mini Mental State Exam  Not completed: --        12/24/2022   10:52 AM 12/11/2021   10:45 AM 12/08/2019   11:03 AM  6CIT Screen  What Year? 0 points 0 points 0 points  What month? 0 points 0 points 0 points  What time? 0 points 0 points 3 points  Count back from 20 0 points 0 points 0 points  Months in reverse 0 points 2 points 0 points  Repeat phrase 0 points 0 points 0 points  Total Score 0 points 2 points 3 points    Immunizations Immunization History  Administered Date(s) Administered   Fluad Quad(high Dose 65+) 11/27/2018, 12/08/2019, 12/08/2020, 12/06/2021   Fluad Trivalent(High Dose 65+) 11/22/2022   Influenza, High Dose Seasonal PF 12/27/2015, 11/21/2016, 12/23/2017   Influenza,inj,Quad PF,6+ Mos 02/09/2013, 11/17/2013, 12/22/2014   Influenza-Unspecified 12/08/2019   Pneumococcal Conjugate-13 12/28/2014   Pneumococcal Polysaccharide-23 12/27/2015, 12/06/2021   Tdap 02/09/2013   Tetanus 02/09/2013    TDAP status: Up to date  Flu Vaccine status: Up to date  Pneumococcal vaccine status: Up to date  Covid-19 vaccine status: Declined, Education has been provided regarding the importance of this vaccine but patient still declined. Advised may receive this vaccine at local pharmacy or Health Dept.or vaccine clinic. Aware to provide a copy of the vaccination record if obtained from local pharmacy or Health Dept. Verbalized acceptance and understanding.  Qualifies for Shingles Vaccine? Yes   Zostavax completed No    Shingrix Completed?: No.    Education has been provided regarding the importance of this vaccine. Patient has been advised to call insurance company to determine out of pocket expense if they have not yet received this vaccine. Advised may also receive vaccine at local pharmacy or Health Dept. Verbalized acceptance and understanding.  Screening Tests Health Maintenance  Topic Date Due   MAMMOGRAM  12/13/2022   Zoster Vaccines- Shingrix (1 of 2) 02/21/2023 (Originally 12/10/1969)   Colonoscopy  11/22/2023 (Originally 11/15/2021)   DTaP/Tdap/Td (3 -  Td or Tdap) 02/10/2023   Medicare Annual Wellness (AWV)  12/24/2023   Pneumonia Vaccine 67+ Years old  Completed   INFLUENZA VACCINE  Completed   DEXA SCAN  Completed   Hepatitis C Screening  Completed   HPV VACCINES  Aged Out   COVID-19 Vaccine  Discontinued    Health Maintenance  Health Maintenance Due  Topic Date Due   MAMMOGRAM  12/13/2022    Colorectal Cancer Screening: Patient declined  Mammogram status: Ordered 11/22/2022. Pt provided with contact info and advised to call to schedule appt.   Bone Density status: Completed 12/07/2019. Results reflect: Bone density results: OSTEOPOROSIS. Repeat every once years.  Lung Cancer Screening: (Low Dose CT Chest recommended if Age 42-80 years, 20 pack-year currently smoking OR have quit w/in 15years.) does not qualify.   Lung Cancer Screening Referral: N/A  Additional Screening:  Hepatitis C Screening: does qualify; Completed 12/27/2015  Vision Screening: Recommended annual ophthalmology exams for early detection of glaucoma and other disorders of the eye. Is the patient up to date with their annual eye exam?  No  Who is the provider or what is the name of the office in which the patient attends annual eye exams? Dr. Burgess Estelle If pt is not established with a provider, would they like to be referred to a provider to establish care? No .   Dental Screening: Recommended annual dental exams  for proper oral hygiene   Community Resource Referral / Chronic Care Management: CRR required this visit?  No   CCM required this visit?  No     Plan:     I have personally reviewed and noted the following in the patient's chart:   Medical and social history Use of alcohol, tobacco or illicit drugs  Current medications and supplements including opioid prescriptions. Patient is not currently taking opioid prescriptions. Functional ability and status Nutritional status Physical activity Advanced directives List of other physicians Hospitalizations, surgeries, and ER visits in previous 12 months Vitals Screenings to include cognitive, depression, and falls Referrals and appointments  In addition, I have reviewed and discussed with patient certain preventive protocols, quality metrics, and best practice recommendations. A written personalized care plan for preventive services as well as general preventive health recommendations were provided to patient.     Marinus Maw, CMA   12/24/2022   After Visit Summary: (MyChart) Due to this being a telephonic visit, the after visit summary with patients personalized plan was offered to patient via MyChart   Nurse Notes: Information sent in the mail to address on file on starting Advanced Directive.

## 2022-12-24 NOTE — Patient Instructions (Signed)
It was great speaking with you today!  Please schedule your next Medicare Wellness Visit with your Nurse Health Advisor in 1 year by calling [phone]. 

## 2023-02-11 ENCOUNTER — Other Ambulatory Visit: Payer: Self-pay | Admitting: Internal Medicine

## 2023-02-11 DIAGNOSIS — F411 Generalized anxiety disorder: Secondary | ICD-10-CM

## 2023-03-14 ENCOUNTER — Other Ambulatory Visit: Payer: Self-pay | Admitting: Internal Medicine

## 2023-03-14 DIAGNOSIS — E876 Hypokalemia: Secondary | ICD-10-CM

## 2023-03-14 DIAGNOSIS — I10 Essential (primary) hypertension: Secondary | ICD-10-CM

## 2023-03-18 ENCOUNTER — Encounter: Payer: Self-pay | Admitting: Internal Medicine

## 2023-04-28 ENCOUNTER — Other Ambulatory Visit: Payer: Self-pay | Admitting: Internal Medicine

## 2023-04-28 DIAGNOSIS — I10 Essential (primary) hypertension: Secondary | ICD-10-CM

## 2023-04-28 DIAGNOSIS — F411 Generalized anxiety disorder: Secondary | ICD-10-CM

## 2023-05-22 ENCOUNTER — Ambulatory Visit: Payer: Medicare Other | Admitting: Internal Medicine

## 2023-06-25 ENCOUNTER — Ambulatory Visit (INDEPENDENT_AMBULATORY_CARE_PROVIDER_SITE_OTHER): Admitting: Internal Medicine

## 2023-06-25 ENCOUNTER — Encounter: Payer: Self-pay | Admitting: Internal Medicine

## 2023-06-25 VITALS — BP 148/86 | HR 67 | Temp 98.0°F | Resp 16 | Ht 62.0 in | Wt 173.6 lb

## 2023-06-25 DIAGNOSIS — R0609 Other forms of dyspnea: Secondary | ICD-10-CM | POA: Insufficient documentation

## 2023-06-25 DIAGNOSIS — E876 Hypokalemia: Secondary | ICD-10-CM | POA: Diagnosis not present

## 2023-06-25 DIAGNOSIS — K760 Fatty (change of) liver, not elsewhere classified: Secondary | ICD-10-CM

## 2023-06-25 DIAGNOSIS — R739 Hyperglycemia, unspecified: Secondary | ICD-10-CM

## 2023-06-25 DIAGNOSIS — I10 Essential (primary) hypertension: Secondary | ICD-10-CM

## 2023-06-25 DIAGNOSIS — T502X5A Adverse effect of carbonic-anhydrase inhibitors, benzothiadiazides and other diuretics, initial encounter: Secondary | ICD-10-CM

## 2023-06-25 DIAGNOSIS — E785 Hyperlipidemia, unspecified: Secondary | ICD-10-CM

## 2023-06-25 LAB — CBC WITH DIFFERENTIAL/PLATELET
Basophils Absolute: 0 10*3/uL (ref 0.0–0.1)
Basophils Relative: 0.3 % (ref 0.0–3.0)
Eosinophils Absolute: 0 10*3/uL (ref 0.0–0.7)
Eosinophils Relative: 0.4 % (ref 0.0–5.0)
HCT: 40.2 % (ref 36.0–46.0)
Hemoglobin: 13.7 g/dL (ref 12.0–15.0)
Lymphocytes Relative: 15.3 % (ref 12.0–46.0)
Lymphs Abs: 1.1 10*3/uL (ref 0.7–4.0)
MCHC: 34 g/dL (ref 30.0–36.0)
MCV: 94.1 fl (ref 78.0–100.0)
Monocytes Absolute: 0.5 10*3/uL (ref 0.1–1.0)
Monocytes Relative: 6.9 % (ref 3.0–12.0)
Neutro Abs: 5.8 10*3/uL (ref 1.4–7.7)
Neutrophils Relative %: 77.1 % — ABNORMAL HIGH (ref 43.0–77.0)
Platelets: 261 10*3/uL (ref 150.0–400.0)
RBC: 4.28 Mil/uL (ref 3.87–5.11)
RDW: 13.2 % (ref 11.5–15.5)
WBC: 7.5 10*3/uL (ref 4.0–10.5)

## 2023-06-25 LAB — URINALYSIS, ROUTINE W REFLEX MICROSCOPIC
Bilirubin Urine: NEGATIVE
Hgb urine dipstick: NEGATIVE
Ketones, ur: NEGATIVE
Leukocytes,Ua: NEGATIVE
Nitrite: NEGATIVE
RBC / HPF: NONE SEEN (ref 0–?)
Specific Gravity, Urine: 1.02 (ref 1.000–1.030)
Total Protein, Urine: NEGATIVE
Urine Glucose: NEGATIVE
Urobilinogen, UA: 0.2 (ref 0.0–1.0)
pH: 6 (ref 5.0–8.0)

## 2023-06-25 LAB — PROTIME-INR
INR: 1.1 ratio — ABNORMAL HIGH (ref 0.8–1.0)
Prothrombin Time: 11.9 s (ref 9.6–13.1)

## 2023-06-25 LAB — HEPATIC FUNCTION PANEL
ALT: 19 U/L (ref 0–35)
AST: 20 U/L (ref 0–37)
Albumin: 4.7 g/dL (ref 3.5–5.2)
Alkaline Phosphatase: 72 U/L (ref 39–117)
Bilirubin, Direct: 0.1 mg/dL (ref 0.0–0.3)
Total Bilirubin: 0.6 mg/dL (ref 0.2–1.2)
Total Protein: 7.6 g/dL (ref 6.0–8.3)

## 2023-06-25 LAB — TROPONIN I (HIGH SENSITIVITY): High Sens Troponin I: 6 ng/L (ref 2–17)

## 2023-06-25 LAB — BASIC METABOLIC PANEL WITH GFR
BUN: 11 mg/dL (ref 6–23)
CO2: 24 meq/L (ref 19–32)
Calcium: 9.8 mg/dL (ref 8.4–10.5)
Chloride: 105 meq/L (ref 96–112)
Creatinine, Ser: 0.56 mg/dL (ref 0.40–1.20)
GFR: 90.47 mL/min (ref >60.00)
Glucose, Bld: 110 mg/dL — ABNORMAL HIGH (ref 70–99)
Potassium: 4 meq/L (ref 3.5–5.1)
Sodium: 143 meq/L (ref 135–145)

## 2023-06-25 LAB — D-DIMER, QUANTITATIVE: D-Dimer, Quant: 0.8 ug{FEU}/mL — ABNORMAL HIGH (ref <0.50)

## 2023-06-25 LAB — HEMOGLOBIN A1C: Hgb A1c MFr Bld: 5.5 % (ref 4.6–6.5)

## 2023-06-25 LAB — BRAIN NATRIURETIC PEPTIDE: Pro B Natriuretic peptide (BNP): 62 pg/mL (ref 0.0–100.0)

## 2023-06-25 LAB — MAGNESIUM: Magnesium: 2 mg/dL (ref 1.5–2.5)

## 2023-06-25 NOTE — Progress Notes (Unsigned)
 Subjective:  Patient ID: Stephanie Pham, female    DOB: Aug 23, 1950  Age: 73 y.o. MRN: [id number]  CC: Hypertension   HPI Stephanie Pham presents for f/up ----  Discussed the use of AI scribe software for clinical note transcription with the patient, who gave verbal consent to proceed.  History of Present Illness   The patient presents with nervousness and DOE.  She experiences nervousness and shakiness, which she attributes to anxiety. She took a 'nerve pill' before leaving home to help manage these symptoms.  No chest pain or cough today, although she notes that she does not usually have these symptoms.       Outpatient Medications Prior to Visit  Medication Sig Dispense Refill   acetaminophen (TYLENOL) 500 MG tablet Take 250 mg by mouth 3 (three) times daily as needed.     atenolol (TENORMIN) 50 MG tablet TAKE 1 TABLET BY MOUTH DAILY 100 tablet 2   Cholecalciferol (VITAMIN D3) 50 MCG (2000 UT) CAPS Take 2,000 Units by mouth in the morning and at bedtime.     clorazepate (TRANXENE) 7.5 MG tablet TAKE 1 TABLET BY MOUTH 4 TIMES  DAILY 400 tablet 0   fosinopril (MONOPRIL) 20 MG tablet Take 1 tablet (20 mg total) by mouth daily. 90 tablet 1   KLOR-CON M15 15 MEQ tablet TAKE 1 TABLET BY MOUTH TWICE  DAILY 200 tablet 2   Krill Oil 300 MG CAPS Take 1,000 mg by mouth daily.     Multiple Vitamins-Minerals (ICAPS AREDS 2 PO) Take 1 capsule by mouth 2 (two) times daily.     simvastatin (ZOCOR) 20 MG tablet Take 1 tablet (20 mg total) by mouth daily at 6 PM. TAKE 1 TABLET BY MOUTH  DAILY AT 6 PM 100 tablet 1   No facility-administered medications prior to visit.    ROS Review of Systems  Constitutional: Negative.  Negative for appetite change, chills, diaphoresis and fatigue.  HENT: Negative.    Eyes: Negative.   Respiratory:  Positive for shortness of breath. Negative for cough, chest tightness and wheezing.   Cardiovascular:  Negative for chest pain, palpitations and leg  swelling.  Genitourinary:  Negative for difficulty urinating.  Musculoskeletal:  Positive for gait problem.  Neurological:  Positive for tremors. Negative for dizziness, weakness and light-headedness.  Hematological:  Negative for adenopathy. Does not bruise/bleed easily.  Psychiatric/Behavioral:  Positive for confusion and decreased concentration. The patient is nervous/anxious.     Objective:  BP (!) 148/86 (BP Location: Left Arm, Patient Position: Sitting, Cuff Size: Normal)   Pulse 67   Temp 98 F (36.7 C) (Oral)   Resp 16   Ht 5\' 2"  (1.575 m)   Wt 173 lb 9.6 oz (78.7 kg)   SpO2 97%   BMI 31.75 kg/m   BP Readings from Last 3 Encounters:  06/25/23 (!) 148/86  12/24/22 120/70  11/22/22 (!) 144/86    Wt Readings from Last 3 Encounters:  06/25/23 173 lb 9.6 oz (78.7 kg)  12/24/22 206 lb (93.4 kg)  03/13/22 206 lb (93.4 kg)    Physical Exam Vitals reviewed.  Constitutional:      General: She is not in acute distress.    Appearance: She is ill-appearing (in a wheelchair). She is not toxic-appearing or diaphoretic.  HENT:     Nose: Nose normal.     Mouth/Throat:     Mouth: Mucous membranes are moist.  Eyes:     General: No scleral icterus.  Conjunctiva/sclera: Conjunctivae normal.  Cardiovascular:     Rate and Rhythm: Normal rate and regular rhythm. Occasional Extrasystoles are present.    Heart sounds: No murmur heard.    No friction rub. No gallop.     Comments: EKG-- SR with occasional PVC, 62 bpm ++++artifact NS ST abnormality No LVH or Q waves Unchanged  Pulmonary:     Effort: Pulmonary effort is normal.     Breath sounds: No stridor. No wheezing, rhonchi or rales.  Abdominal:     General: Abdomen is flat.     Palpations: There is no mass.     Tenderness: There is no abdominal tenderness. There is no guarding.     Hernia: No hernia is present.  Musculoskeletal:        General: Normal range of motion.     Cervical back: Neck supple.     Right lower  leg: No edema.     Left lower leg: No edema.  Lymphadenopathy:     Cervical: No cervical adenopathy.  Skin:    General: Skin is warm and dry.  Neurological:     Mental Status: She is alert. Mental status is at baseline.     Lab Results  Component Value Date   WBC 6.1 11/22/2022   HGB 13.8 11/22/2022   HCT 42.7 11/22/2022   PLT 248.0 11/22/2022   GLUCOSE 139 (H) 11/22/2022   CHOL 136 11/22/2022   TRIG 109.0 11/22/2022   HDL 54.90 11/22/2022   LDLDIRECT 74.0 06/13/2021   LDLCALC 59 11/22/2022   ALT 15 11/22/2022   AST 16 11/22/2022   NA 140 11/22/2022   K 3.4 (L) 11/22/2022   CL 103 11/22/2022   CREATININE 0.59 11/22/2022   BUN 10 11/22/2022   CO2 28 11/22/2022   TSH 0.90 11/22/2022   INR 1.1 (H) 12/23/2019   HGBA1C 5.4 11/22/2022    MR ABDOMEN WO CONTRAST Result Date: 01/01/2022 CLINICAL DATA:  Weight loss, unintentional times a few months. Paraproteinemia. Right flank tenderness. EXAM: MRI ABDOMEN WITHOUT CONTRAST TECHNIQUE: Multiplanar multisequence MR imaging was performed without the administration of intravenous contrast. COMPARISON:  CT September 07, 2006. FINDINGS: Lower chest: Elevation of the left hemidiaphragm. Hepatobiliary: No significant hepatic steatosis. No suspicious hepatic lesion on this noncontrast enhanced evaluation. Gallbladder is decompressed or surgically absent. No biliary ductal dilation. Pancreas: Intrinsic T1 signal of the pancreatic parenchyma is within normal limits. No pancreatic ductal dilation. Spleen:  No splenomegaly. Adrenals/Urinary Tract: Bilateral adrenal glands appear normal. No hydronephrosis. Fluid signal subcentimeter left renal lesion is incompletely evaluated without intravenous contrast material but statistically likely reflect a cyst and considered benign requiring no independent imaging follow-up. Stomach/Bowel: Visualized portions within the abdomen are unremarkable. Vascular/Lymphatic: Normal caliber abdominal aorta. No pathologically  enlarged abdominal lymph nodes. Other:  No significant abdominal free fluid. Musculoskeletal: No suspicious bone lesions identified. IMPRESSION: No acute or significant abnormal finding on this noncontrast enhanced MRI of the abdomen. Electronically Signed   By: Maudry Mayhew M.D.   On: 01/01/2022 14:42    Assessment & Plan:  Diuretic-induced hypokalemia -     Basic metabolic panel with GFR; Future -     Magnesium; Future  Chronic hyperglycemia -     Basic metabolic panel with GFR; Future -     Hemoglobin A1c; Future  Fatty liver disease, nonalcoholic -     Protime-INR; Future -     Hepatic function panel; Future  Hypertension, unspecified type -     EKG 12-Lead -  CBC with Differential/Platelet; Future -     Urinalysis, Routine w reflex microscopic; Future  DOE (dyspnea on exertion) -     Brain natriuretic peptide; Future -     Troponin I (High Sensitivity); Future -     D-dimer, quantitative; Future     Follow-up: Return in about 6 months (around 12/25/2023).  Sanda Linger, MD

## 2023-06-25 NOTE — Patient Instructions (Signed)
 Hypertension, Adult High blood pressure (hypertension) is when the force of blood pumping through the arteries is too strong. The arteries are the blood vessels that carry blood from the heart throughout the body. Hypertension forces the heart to work harder to pump blood and may cause arteries to become narrow or stiff. Untreated or uncontrolled hypertension can lead to a heart attack, heart failure, a stroke, kidney disease, and other problems. A blood pressure reading consists of a higher number over a lower number. Ideally, your blood pressure should be below 120/80. The first ("top") number is called the systolic pressure. It is a measure of the pressure in your arteries as your heart beats. The second ("bottom") number is called the diastolic pressure. It is a measure of the pressure in your arteries as the heart relaxes. What are the causes? The exact cause of this condition is not known. There are some conditions that result in high blood pressure. What increases the risk? Certain factors may make you more likely to develop high blood pressure. Some of these risk factors are under your control, including: Smoking. Not getting enough exercise or physical activity. Being overweight. Having too much fat, sugar, calories, or salt (sodium) in your diet. Drinking too much alcohol. Other risk factors include: Having a personal history of heart disease, diabetes, high cholesterol, or kidney disease. Stress. Having a family history of high blood pressure and high cholesterol. Having obstructive sleep apnea. Age. The risk increases with age. What are the signs or symptoms? High blood pressure may not cause symptoms. Very high blood pressure (hypertensive crisis) may cause: Headache. Fast or irregular heartbeats (palpitations). Shortness of breath. Nosebleed. Nausea and vomiting. Vision changes. Severe chest pain, dizziness, and seizures. How is this diagnosed? This condition is diagnosed by  measuring your blood pressure while you are seated, with your arm resting on a flat surface, your legs uncrossed, and your feet flat on the floor. The cuff of the blood pressure monitor will be placed directly against the skin of your upper arm at the level of your heart. Blood pressure should be measured at least twice using the same arm. Certain conditions can cause a difference in blood pressure between your right and left arms. If you have a high blood pressure reading during one visit or you have normal blood pressure with other risk factors, you may be asked to: Return on a different day to have your blood pressure checked again. Monitor your blood pressure at home for 1 week or longer. If you are diagnosed with hypertension, you may have other blood or imaging tests to help your health care provider understand your overall risk for other conditions. How is this treated? This condition is treated by making healthy lifestyle changes, such as eating healthy foods, exercising more, and reducing your alcohol intake. You may be referred for counseling on a healthy diet and physical activity. Your health care provider may prescribe medicine if lifestyle changes are not enough to get your blood pressure under control and if: Your systolic blood pressure is above 130. Your diastolic blood pressure is above 80. Your personal target blood pressure may vary depending on your medical conditions, your age, and other factors. Follow these instructions at home: Eating and drinking  Eat a diet that is high in fiber and potassium, and low in sodium, added sugar, and fat. An example of this eating plan is called the DASH diet. DASH stands for Dietary Approaches to Stop Hypertension. To eat this way: Eat  plenty of fresh fruits and vegetables. Try to fill one half of your plate at each meal with fruits and vegetables. Eat whole grains, such as whole-wheat pasta, brown rice, or whole-grain bread. Fill about one  fourth of your plate with whole grains. Eat or drink low-fat dairy products, such as skim milk or low-fat yogurt. Avoid fatty cuts of meat, processed or cured meats, and poultry with skin. Fill about one fourth of your plate with lean proteins, such as fish, chicken without skin, beans, eggs, or tofu. Avoid pre-made and processed foods. These tend to be higher in sodium, added sugar, and fat. Reduce your daily sodium intake. Many people with hypertension should eat less than 1,500 mg of sodium a day. Do not drink alcohol if: Your health care provider tells you not to drink. You are pregnant, may be pregnant, or are planning to become pregnant. If you drink alcohol: Limit how much you have to: 0-1 drink a day for women. 0-2 drinks a day for men. Know how much alcohol is in your drink. In the [place] one drink equals one 12 oz bottle of beer (355 mL), one 5 oz glass of wine (148 mL), or one 1 oz glass of hard liquor (44 mL). Lifestyle  Work with your health care provider to maintain a healthy body weight or to lose weight. Ask what an ideal weight is for you. Get at least 30 minutes of exercise that causes your heart to beat faster (aerobic exercise) most days of the week. Activities may include walking, swimming, or biking. Include exercise to strengthen your muscles (resistance exercise), such as Pilates or lifting weights, as part of your weekly exercise routine. Try to do these types of exercises for 30 minutes at least 3 days a week. Do not use any products that contain nicotine or tobacco. These products include cigarettes, chewing tobacco, and vaping devices, such as e-cigarettes. If you need help quitting, ask your health care provider. Monitor your blood pressure at home as told by your health care provider. Keep all follow-up visits. This is important. Medicines Take over-the-counter and prescription medicines only as told by your health care provider. Follow directions carefully. Blood  pressure medicines must be taken as prescribed. Do not skip doses of blood pressure medicine. Doing this puts you at risk for problems and can make the medicine less effective. Ask your health care provider about side effects or reactions to medicines that you should watch for. Contact a health care provider if you: Think you are having a reaction to a medicine you are taking. Have headaches that keep coming back (recurring). Feel dizzy. Have swelling in your ankles. Have trouble with your vision. Get help right away if you: Develop a severe headache or confusion. Have unusual weakness or numbness. Feel faint. Have severe pain in your chest or abdomen. Vomit repeatedly. Have trouble breathing. These symptoms may be an emergency. Get help right away. Call 911. Do not wait to see if the symptoms will go away. Do not drive yourself to the hospital. Summary Hypertension is when the force of blood pumping through your arteries is too strong. If this condition is not controlled, it may put you at risk for serious complications. Your personal target blood pressure may vary depending on your medical conditions, your age, and other factors. For most people, a normal blood pressure is less than 120/80. Hypertension is treated with lifestyle changes, medicines, or a combination of both. Lifestyle changes include losing weight, eating a healthy,  low-sodium diet, exercising more, and limiting alcohol. This information is not intended to replace advice given to you by your health care provider. Make sure you discuss any questions you have with your health care provider. Document Revised: 01/10/2021 Document Reviewed: 01/10/2021 Elsevier Patient Education  2024 [employer].

## 2023-06-27 MED ORDER — FOSINOPRIL SODIUM 20 MG PO TABS: 20.0000 mg | ORAL_TABLET | Freq: Every day | ORAL | 1 refills | Status: DC

## 2023-06-27 MED ORDER — FOSINOPRIL SODIUM 20 MG PO TABS
20.0000 mg | ORAL_TABLET | Freq: Every day | ORAL | 1 refills | Status: DC
Start: 2023-06-27 — End: 2023-10-11

## 2023-06-27 MED ORDER — SIMVASTATIN 20 MG PO TABS: 20.0000 mg | ORAL_TABLET | Freq: Every day | ORAL | 1 refills | Status: DC

## 2023-06-28 ENCOUNTER — Encounter: Payer: Self-pay | Admitting: Internal Medicine

## 2023-08-08 ENCOUNTER — Other Ambulatory Visit: Payer: Self-pay | Admitting: Internal Medicine

## 2023-08-08 DIAGNOSIS — F411 Generalized anxiety disorder: Secondary | ICD-10-CM

## 2023-09-14 ENCOUNTER — Other Ambulatory Visit: Payer: Self-pay | Admitting: Internal Medicine

## 2023-09-14 DIAGNOSIS — I10 Essential (primary) hypertension: Secondary | ICD-10-CM

## 2023-10-10 ENCOUNTER — Other Ambulatory Visit: Payer: Self-pay | Admitting: Internal Medicine

## 2023-10-10 DIAGNOSIS — I10 Essential (primary) hypertension: Secondary | ICD-10-CM

## 2023-11-09 ENCOUNTER — Other Ambulatory Visit: Payer: Self-pay | Admitting: Internal Medicine

## 2023-11-09 DIAGNOSIS — E785 Hyperlipidemia, unspecified: Secondary | ICD-10-CM

## 2023-11-13 ENCOUNTER — Encounter: Payer: Self-pay | Admitting: Internal Medicine

## 2023-11-15 ENCOUNTER — Other Ambulatory Visit: Payer: Self-pay

## 2023-11-15 ENCOUNTER — Telehealth: Payer: Self-pay | Admitting: Internal Medicine

## 2023-11-15 DIAGNOSIS — F411 Generalized anxiety disorder: Secondary | ICD-10-CM

## 2023-11-15 MED ORDER — CLORAZEPATE DIPOTASSIUM 7.5 MG PO TABS: 7.5000 mg | ORAL_TABLET | Freq: Four times a day (QID) | ORAL | 0 refills | Status: DC

## 2023-11-15 NOTE — Telephone Encounter (Unsigned)
 Copied from CRM [id number]. Topic: Clinical - Medication Refill >> Nov 15, 2023  9:18 AM Mia F wrote: Medication:   Has the patient contacted their pharmacy? Yes (Agent: If no, request that the patient contact the pharmacy for the refill. If patient does not wish to contact the pharmacy document the reason why and proceed with request.) (Agent: If yes, when and what did the pharmacy advise?)  This is the patient's preferred pharmacy:  [facility] - [place], [place] - [address] [place] [address] Phone: [phone] Fax: [phone]  Is this the correct pharmacy for this prescription? Yes If no, delete pharmacy and type the correct one.   Has the prescription been filled recently? No  Is the patient out of the medication? No  Has the patient been seen for an appointment in the last year OR does the patient have an upcoming appointment? Yes  Can we respond through MyChart? Yes  Agent: Please be advised that Rx refills may take up to 3 business days. We ask that you follow-up with your pharmacy.

## 2023-11-15 NOTE — Telephone Encounter (Signed)
 Copied from CRM #[id number]. Topic: Clinical - Prescription Issue >> Nov 15, 2023  9:12 AM Mia F wrote: Reason for CRM: Pt has called for a refill for clorazepate  (TRANXENE ) 7.5 MG tablet a few times and has not received a response. She has also reached out to the pcp with no response. PT says she will be out on 9/4. Her pharmacy is about to cancel out the order due to no response.

## 2023-11-21 NOTE — Telephone Encounter (Signed)
 Patient has received her medication

## 2023-12-01 ENCOUNTER — Other Ambulatory Visit: Payer: Self-pay | Admitting: Internal Medicine

## 2023-12-01 DIAGNOSIS — I10 Essential (primary) hypertension: Secondary | ICD-10-CM

## 2023-12-01 DIAGNOSIS — E876 Hypokalemia: Secondary | ICD-10-CM

## 2023-12-23 ENCOUNTER — Encounter: Payer: Self-pay | Admitting: Internal Medicine

## 2023-12-23 ENCOUNTER — Ambulatory Visit (INDEPENDENT_AMBULATORY_CARE_PROVIDER_SITE_OTHER): Admitting: Internal Medicine

## 2023-12-23 VITALS — BP 134/86 | HR 60 | Temp 98.1°F | Ht 62.0 in | Wt 173.6 lb

## 2023-12-23 DIAGNOSIS — E785 Hyperlipidemia, unspecified: Secondary | ICD-10-CM

## 2023-12-23 DIAGNOSIS — E781 Pure hyperglyceridemia: Secondary | ICD-10-CM

## 2023-12-23 DIAGNOSIS — I10 Essential (primary) hypertension: Secondary | ICD-10-CM

## 2023-12-23 DIAGNOSIS — R739 Hyperglycemia, unspecified: Secondary | ICD-10-CM

## 2023-12-23 NOTE — Progress Notes (Unsigned)
 Subjective:  Patient ID: Stephanie Pham, female    DOB: 1950-10-26  Age: 73 y.o. MRN: [id number]  CC: Medical Management of Chronic Issues (6 month follow up )   HPI AYDIA MAJ presents for f/up ---  Discussed the use of AI scribe software for clinical note transcription with the patient, who gave verbal consent to proceed.  History of Present Illness Stephanie Pham is a 73 year old female who presents with tremors and sleep disturbances.  She experiences tremors and describes a sensation of feeling her heartbeat in the top of her head, which she attributes to nervousness. No chest pain or shortness of breath is associated with these symptoms.  She has difficulty sleeping, often waking up very early and needing to urinate two to three times each night. Although she sometimes falls back asleep, she typically rises at 5:30 AM.  She notes swelling in her ankles, which makes walking difficult. She tries to move around the house every 30 to 40 minutes, walking to the bathroom, and is cautious to avoid falling due to her ankles and feet feeling 'funny'.  Her bowel movements are generally fine, though occasionally they are 'real fast'. No chest pain or shortness of breath is noted when walking to the bathroom.     Outpatient Medications Prior to Visit  Medication Sig Dispense Refill   acetaminophen (TYLENOL) 500 MG tablet Take 250 mg by mouth 3 (three) times daily as needed.     atenolol  (TENORMIN ) 50 MG tablet TAKE 1 TABLET BY MOUTH DAILY 100 tablet 2   Cholecalciferol  (VITAMIN D3) 50 MCG (2000 UT) CAPS Take 2,000 Units by mouth in the morning and at bedtime.     clorazepate  (TRANXENE ) 7.5 MG tablet Take 1 tablet (7.5 mg total) by mouth 4 (four) times daily. 400 tablet 0   fosinopril  (MONOPRIL ) 20 MG tablet TAKE 1 TABLET BY MOUTH DAILY 100 tablet 0   Krill Oil 300 MG CAPS Take 1,000 mg by mouth daily.     Multiple Vitamins-Minerals (ICAPS AREDS 2 PO) Take 1 capsule by mouth 2  (two) times daily.     potassium chloride  SA (KLOR-CON  M15) 15 MEQ tablet TAKE 1 TABLET BY MOUTH TWICE  DAILY 180 tablet 2   simvastatin  (ZOCOR ) 20 MG tablet TAKE 1 TABLET BY MOUTH DAILY AT  6 PM 100 tablet 2   No facility-administered medications prior to visit.    ROS Review of Systems  Constitutional:  Negative for appetite change, chills, diaphoresis, fatigue, fever and unexpected weight change.  HENT: Negative.  Negative for sore throat and trouble swallowing.   Eyes: Negative.   Respiratory: Negative.  Negative for cough, chest tightness, shortness of breath and wheezing.   Cardiovascular:  Negative for chest pain, palpitations and leg swelling.  Gastrointestinal: Negative.  Negative for abdominal pain, constipation, diarrhea, nausea and vomiting.  Endocrine: Negative.   Genitourinary: Negative.  Negative for difficulty urinating.  Musculoskeletal:  Positive for arthralgias and gait problem. Negative for joint swelling.  Skin: Negative.   Neurological:  Positive for tremors. Negative for dizziness and light-headedness.  Hematological:  Negative for adenopathy. Does not bruise/bleed easily.  Psychiatric/Behavioral:  Positive for confusion and decreased concentration. The patient is nervous/anxious.     Objective:  BP 134/86 (BP Location: Left Arm, Patient Position: Sitting, Cuff Size: Normal)   Pulse 60   Temp 98.1 F (36.7 C) (Oral)   Ht 5' 2 (1.575 m)   Wt 173 lb 9.6 oz (78.7 kg)  SpO2 98%   BMI 31.75 kg/m   BP Readings from Last 3 Encounters:  12/23/23 134/86  06/25/23 (!) 148/86  12/24/22 120/70    Wt Readings from Last 3 Encounters:  12/23/23 173 lb 9.6 oz (78.7 kg)  06/25/23 173 lb 9.6 oz (78.7 kg)  12/24/22 206 lb (93.4 kg)    Physical Exam Vitals reviewed.  Constitutional:      General: She is not in acute distress.    Appearance: She is ill-appearing. She is not toxic-appearing or diaphoretic.  HENT:     Nose: Nose normal.     Mouth/Throat:      Mouth: Mucous membranes are moist.  Eyes:     General: No scleral icterus.    Conjunctiva/sclera: Conjunctivae normal.  Cardiovascular:     Rate and Rhythm: Normal rate and regular rhythm.     Heart sounds: No murmur heard.    No friction rub. No gallop.  Pulmonary:     Effort: Pulmonary effort is normal.     Breath sounds: No wheezing, rhonchi or rales.  Abdominal:     Palpations: There is no mass.     Tenderness: There is no abdominal tenderness. There is no guarding.     Hernia: No hernia is present.  Musculoskeletal:        General: Normal range of motion.     Cervical back: Neck supple.     Right lower leg: No edema.     Left lower leg: No edema.  Lymphadenopathy:     Cervical: No cervical adenopathy.  Skin:    General: Skin is warm and dry.  Neurological:     Mental Status: She is alert. Mental status is at baseline.  Psychiatric:        Attention and Perception: She is inattentive.        Mood and Affect: Mood is anxious. Mood is not depressed.        Speech: She is communicative. Speech is tangential. Speech is not delayed.        Behavior: Behavior normal. Behavior is cooperative.        Thought Content: Thought content normal. Thought content is not paranoid or delusional. Thought content does not include homicidal or suicidal ideation.        Cognition and Memory: Cognition is impaired. Memory is impaired.        Judgment: Judgment normal.     Lab Results  Component Value Date   WBC 7.5 06/25/2023   HGB 13.7 06/25/2023   HCT 40.2 06/25/2023   PLT 261.0 06/25/2023   GLUCOSE 110 (H) 06/25/2023   CHOL 136 11/22/2022   TRIG 109.0 11/22/2022   HDL 54.90 11/22/2022   LDLDIRECT 74.0 06/13/2021   LDLCALC 59 11/22/2022   ALT 19 06/25/2023   AST 20 06/25/2023   NA 143 06/25/2023   K 4.0 06/25/2023   CL 105 06/25/2023   CREATININE 0.56 06/25/2023   BUN 11 06/25/2023   CO2 24 06/25/2023   TSH 0.90 11/22/2022   INR 1.1 (H) 06/25/2023   HGBA1C 5.5 06/25/2023     MR ABDOMEN WO CONTRAST Result Date: 01/01/2022 CLINICAL DATA:  Weight loss, unintentional times a few months. Paraproteinemia. Right flank tenderness. EXAM: MRI ABDOMEN WITHOUT CONTRAST TECHNIQUE: Multiplanar multisequence MR imaging was performed without the administration of intravenous contrast. COMPARISON:  CT September 07, 2006. FINDINGS: Lower chest: Elevation of the left hemidiaphragm. Hepatobiliary: No significant hepatic steatosis. No suspicious hepatic lesion on this noncontrast enhanced evaluation. Gallbladder  is decompressed or surgically absent. No biliary ductal dilation. Pancreas: Intrinsic T1 signal of the pancreatic parenchyma is within normal limits. No pancreatic ductal dilation. Spleen:  No splenomegaly. Adrenals/Urinary Tract: Bilateral adrenal glands appear normal. No hydronephrosis. Fluid signal subcentimeter left renal lesion is incompletely evaluated without intravenous contrast material but statistically likely reflect a cyst and considered benign requiring no independent imaging follow-up. Stomach/Bowel: Visualized portions within the abdomen are unremarkable. Vascular/Lymphatic: Normal caliber abdominal aorta. No pathologically enlarged abdominal lymph nodes. Other:  No significant abdominal free fluid. Musculoskeletal: No suspicious bone lesions identified. IMPRESSION: No acute or significant abnormal finding on this noncontrast enhanced MRI of the abdomen. Electronically Signed   By: Reyes Holder M.D.   On: 01/01/2022 14:42    Assessment & Plan:  Hyperlipidemia with target LDL less than 130 -     Lipid panel; Future -     Hepatic function panel; Future -     TSH; Future  Chronic hyperglycemia -     Basic metabolic panel with GFR; Future -     Hemoglobin A1c; Future  Hypertriglyceridemia -     Lipid panel; Future -     Hepatic function panel; Future  Primary hypertension- Her BP is well controlled. -     Basic metabolic panel with GFR; Future -     CBC with  Differential/Platelet; Future -     TSH; Future     Follow-up: Return in about 6 months (around 06/22/2024).  Debby Molt, MD

## 2023-12-23 NOTE — Patient Instructions (Signed)
 Hypertension, Adult High blood pressure (hypertension) is when the force of blood pumping through the arteries is too strong. The arteries are the blood vessels that carry blood from the heart throughout the body. Hypertension forces the heart to work harder to pump blood and may cause arteries to become narrow or stiff. Untreated or uncontrolled hypertension can lead to a heart attack, heart failure, a stroke, kidney disease, and other problems. A blood pressure reading consists of a higher number over a lower number. Ideally, your blood pressure should be below 120/80. The first ("top") number is called the systolic pressure. It is a measure of the pressure in your arteries as your heart beats. The second ("bottom") number is called the diastolic pressure. It is a measure of the pressure in your arteries as the heart relaxes. What are the causes? The exact cause of this condition is not known. There are some conditions that result in high blood pressure. What increases the risk? Certain factors may make you more likely to develop high blood pressure. Some of these risk factors are under your control, including: Smoking. Not getting enough exercise or physical activity. Being overweight. Having too much fat, sugar, calories, or salt (sodium) in your diet. Drinking too much alcohol. Other risk factors include: Having a personal history of heart disease, diabetes, high cholesterol, or kidney disease. Stress. Having a family history of high blood pressure and high cholesterol. Having obstructive sleep apnea. Age. The risk increases with age. What are the signs or symptoms? High blood pressure may not cause symptoms. Very high blood pressure (hypertensive crisis) may cause: Headache. Fast or irregular heartbeats (palpitations). Shortness of breath. Nosebleed. Nausea and vomiting. Vision changes. Severe chest pain, dizziness, and seizures. How is this diagnosed? This condition is diagnosed by  measuring your blood pressure while you are seated, with your arm resting on a flat surface, your legs uncrossed, and your feet flat on the floor. The cuff of the blood pressure monitor will be placed directly against the skin of your upper arm at the level of your heart. Blood pressure should be measured at least twice using the same arm. Certain conditions can cause a difference in blood pressure between your right and left arms. If you have a high blood pressure reading during one visit or you have normal blood pressure with other risk factors, you may be asked to: Return on a different day to have your blood pressure checked again. Monitor your blood pressure at home for 1 week or longer. If you are diagnosed with hypertension, you may have other blood or imaging tests to help your health care provider understand your overall risk for other conditions. How is this treated? This condition is treated by making healthy lifestyle changes, such as eating healthy foods, exercising more, and reducing your alcohol intake. You may be referred for counseling on a healthy diet and physical activity. Your health care provider may prescribe medicine if lifestyle changes are not enough to get your blood pressure under control and if: Your systolic blood pressure is above 130. Your diastolic blood pressure is above 80. Your personal target blood pressure may vary depending on your medical conditions, your age, and other factors. Follow these instructions at home: Eating and drinking  Eat a diet that is high in fiber and potassium, and low in sodium, added sugar, and fat. An example of this eating plan is called the DASH diet. DASH stands for Dietary Approaches to Stop Hypertension. To eat this way: Eat  plenty of fresh fruits and vegetables. Try to fill one half of your plate at each meal with fruits and vegetables. Eat whole grains, such as whole-wheat pasta, brown rice, or whole-grain bread. Fill about one  fourth of your plate with whole grains. Eat or drink low-fat dairy products, such as skim milk or low-fat yogurt. Avoid fatty cuts of meat, processed or cured meats, and poultry with skin. Fill about one fourth of your plate with lean proteins, such as fish, chicken without skin, beans, eggs, or tofu. Avoid pre-made and processed foods. These tend to be higher in sodium, added sugar, and fat. Reduce your daily sodium intake. Many people with hypertension should eat less than 1,500 mg of sodium a day. Do not drink alcohol if: Your health care provider tells you not to drink. You are pregnant, may be pregnant, or are planning to become pregnant. If you drink alcohol: Limit how much you have to: 0-1 drink a day for women. 0-2 drinks a day for men. Know how much alcohol is in your drink. In the [place] one drink equals one 12 oz bottle of beer (355 mL), one 5 oz glass of wine (148 mL), or one 1 oz glass of hard liquor (44 mL). Lifestyle  Work with your health care provider to maintain a healthy body weight or to lose weight. Ask what an ideal weight is for you. Get at least 30 minutes of exercise that causes your heart to beat faster (aerobic exercise) most days of the week. Activities may include walking, swimming, or biking. Include exercise to strengthen your muscles (resistance exercise), such as Pilates or lifting weights, as part of your weekly exercise routine. Try to do these types of exercises for 30 minutes at least 3 days a week. Do not use any products that contain nicotine or tobacco. These products include cigarettes, chewing tobacco, and vaping devices, such as e-cigarettes. If you need help quitting, ask your health care provider. Monitor your blood pressure at home as told by your health care provider. Keep all follow-up visits. This is important. Medicines Take over-the-counter and prescription medicines only as told by your health care provider. Follow directions carefully. Blood  pressure medicines must be taken as prescribed. Do not skip doses of blood pressure medicine. Doing this puts you at risk for problems and can make the medicine less effective. Ask your health care provider about side effects or reactions to medicines that you should watch for. Contact a health care provider if you: Think you are having a reaction to a medicine you are taking. Have headaches that keep coming back (recurring). Feel dizzy. Have swelling in your ankles. Have trouble with your vision. Get help right away if you: Develop a severe headache or confusion. Have unusual weakness or numbness. Feel faint. Have severe pain in your chest or abdomen. Vomit repeatedly. Have trouble breathing. These symptoms may be an emergency. Get help right away. Call 911. Do not wait to see if the symptoms will go away. Do not drive yourself to the hospital. Summary Hypertension is when the force of blood pumping through your arteries is too strong. If this condition is not controlled, it may put you at risk for serious complications. Your personal target blood pressure may vary depending on your medical conditions, your age, and other factors. For most people, a normal blood pressure is less than 120/80. Hypertension is treated with lifestyle changes, medicines, or a combination of both. Lifestyle changes include losing weight, eating a healthy,  low-sodium diet, exercising more, and limiting alcohol. This information is not intended to replace advice given to you by your health care provider. Make sure you discuss any questions you have with your health care provider. Document Revised: 01/10/2021 Document Reviewed: 01/10/2021 Elsevier Patient Education  2024 [employer].

## 2023-12-25 ENCOUNTER — Encounter: Payer: Self-pay | Admitting: Internal Medicine

## 2023-12-26 ENCOUNTER — Ambulatory Visit

## 2024-01-03 ENCOUNTER — Ambulatory Visit (INDEPENDENT_AMBULATORY_CARE_PROVIDER_SITE_OTHER)

## 2024-01-03 VITALS — Ht 62.0 in | Wt 173.0 lb

## 2024-01-03 DIAGNOSIS — Z0000 Encounter for general adult medical examination without abnormal findings: Secondary | ICD-10-CM | POA: Diagnosis not present

## 2024-01-03 NOTE — Patient Instructions (Addendum)
 Stephanie Pham,  Thank you for taking the time for your Medicare Wellness Visit. I appreciate your continued commitment to your health goals. Please review the care plan we discussed, and feel free to reach out if I can assist you further.  Medicare recommends these wellness visits once per year to help you and your care team stay ahead of potential health issues. These visits are designed to focus on prevention, allowing your provider to concentrate on managing your acute and chronic conditions during your regular appointments.  Please note that Annual Wellness Visits do not include a physical exam. Some assessments may be limited, especially if the visit was conducted virtually. If needed, we may recommend a separate in-person follow-up with your provider.  Ongoing Care Seeing your primary care provider every 3 to 6 months helps us  monitor your health and provide consistent, personalized care.   Referrals If a referral was made during today's visit and you haven't received any updates within two weeks, please contact the referred provider directly to check on the status.  Recommended Screenings:  Health Maintenance  Topic Date Due   Zoster (Shingles) Vaccine (1 of 2) 03/24/2024*   Flu Shot  06/16/2024*   DTaP/Tdap/Td vaccine (3 - Td or Tdap) 06/24/2024*   Breast Cancer Screening  06/24/2024*   Colon Cancer Screening  12/22/2024*   Medicare Annual Wellness Visit  01/02/2025   Pneumococcal Vaccine for age over 25  Completed   DEXA scan (bone density measurement)  Completed   Hepatitis C Screening  Completed   Meningitis B Vaccine  Aged Out   COVID-19 Vaccine  Discontinued  *Topic was postponed. The date shown is not the original due date.       12/24/2022   10:48 AM  Advanced Directives  Does Patient Have a Medical Advance Directive? No  Would patient like information on creating a medical advance directive? No - Patient declined   Advance Care Planning is important because  it: Ensures you receive medical care that aligns with your values, goals, and preferences. Provides guidance to your family and loved ones, reducing the emotional burden of decision-making during critical moments.  Vision: Annual vision screenings are recommended for early detection of glaucoma, cataracts, and diabetic retinopathy. These exams can also reveal signs of chronic conditions such as diabetes and high blood pressure.  Dental: Annual dental screenings help detect early signs of oral cancer, gum disease, and other conditions linked to overall health, including heart disease and diabetes.

## 2024-01-03 NOTE — Progress Notes (Signed)
 Subjective:  Please attest and cosign this visit due to patients primary care provider not being in the office at the time the visit was completed.  (Pt of Dr Debby Molt)   Stephanie Pham is a 73 y.o. who presents for a Medicare Wellness preventive visit.  As a reminder, Annual Wellness Visits don't include a physical exam, and some assessments may be limited, especially if this visit is performed virtually. We may recommend an in-person follow-up visit with your provider if needed.  Visit Complete: Virtual I connected with  Stephanie Pham on 01/03/24 by a audio enabled telemedicine application and verified that I am speaking with the correct person using two identifiers.  Patient Location: Home  Provider Location: Office/Clinic  I discussed the limitations of evaluation and management by telemedicine. The patient expressed understanding and agreed to proceed.  Vital Signs: Because this visit was a virtual/telehealth visit, some criteria may be missing or patient reported. Any vitals not documented were not able to be obtained and vitals that have been documented are patient reported.  VideoDeclined- This patient declined Librarian, academic. Therefore the visit was completed with audio only.  Persons Participating in Visit: Patient.  AWV Questionnaire: Yes: Patient Medicare AWV questionnaire was completed by the patient on 01/01/2024; I have confirmed that all information answered by patient is correct and no changes since this date.  Cardiac Risk Factors include: advanced age (>37men, >83 women);dyslipidemia;hypertension;obesity (BMI >30kg/m2)     Objective:    Today's Vitals   01/03/24 0818  Weight: 173 lb (78.5 kg)  Height: 5' 2 (1.575 m)   Body mass index is 31.64 kg/m.     12/24/2022   10:48 AM 12/11/2021   10:40 AM 12/09/2020   10:06 AM 12/08/2019    9:56 AM 11/27/2018   10:35 AM 11/22/2017    9:52 AM 11/21/2016    9:35 AM  Advanced  Directives  Does Patient Have a Medical Advance Directive? No No No No No No  No   Does patient want to make changes to medical advance directive?      Yes (ED - Information included in AVS)    Would patient like information on creating a medical advance directive? No - Patient declined No - Patient declined No - Patient declined No - Patient declined No - Patient declined  Yes (ED - Information included in AVS)      Data saved with a previous flowsheet row definition    Current Medications (verified) Outpatient Encounter Medications as of 01/03/2024  Medication Sig   acetaminophen (TYLENOL) 500 MG tablet Take 250 mg by mouth 3 (three) times daily as needed.   atenolol  (TENORMIN ) 50 MG tablet TAKE 1 TABLET BY MOUTH DAILY   Cholecalciferol  (VITAMIN D3) 50 MCG (2000 UT) CAPS Take 2,000 Units by mouth in the morning and at bedtime.   clorazepate  (TRANXENE ) 7.5 MG tablet Take 1 tablet (7.5 mg total) by mouth 4 (four) times daily.   fosinopril  (MONOPRIL ) 20 MG tablet TAKE 1 TABLET BY MOUTH DAILY   Krill Oil 300 MG CAPS Take 1,000 mg by mouth daily.   Multiple Vitamins-Minerals (ICAPS AREDS 2 PO) Take 1 capsule by mouth 2 (two) times daily.   potassium chloride  SA (KLOR-CON  M15) 15 MEQ tablet TAKE 1 TABLET BY MOUTH TWICE  DAILY   simvastatin  (ZOCOR ) 20 MG tablet TAKE 1 TABLET BY MOUTH DAILY AT  6 PM   No facility-administered encounter medications on file as of 01/03/2024.  Allergies (verified) Codeine, Iohexol, Ivp dye [iodinated contrast media], Minocycline, and Sulfonamide derivatives   History: Past Medical History:  Diagnosis Date   Allergy    Anemia    Blood transfusion without reported diagnosis 1976   Cancer (HCC)    colon cancer 1976   Cataract    both eyes-surgery   Chronic anxiety    Chronic depression    Diverticulosis of colon    patient said it may have been diverticulitis on cipro for a long period   Dyslipidemia    Hemorrhoids    Hyperlipidemia    Hypertension     Internal hemorrhoid    Macular degeneration    Osteoporosis    neck and hip osteopenia   Past Surgical History:  Procedure Laterality Date   ABDOMINAL HYSTERECTOMY     CATARACT EXTRACTION     left eye 05-13-2013   CHOLECYSTECTOMY     COLON SURGERY  2008   COLONOSCOPY     laparotomy and removal of polyp     sigmoid colectomy     Family History  Problem Relation Age of Onset   Fibromyalgia Mother    Hypertension Mother    Other Mother        hallucinations and fibromyalgia   Ovarian cysts Mother        growths   Heart disease Father    Diabetes Father    Heart attack Father 53       d. due to 2nd heart attack at 70   Breast cancer Maternal Aunt        dx 60s; s/p mastectomy   Other Maternal Aunt        hx of colon surgery in her 9s, lim info   COPD Brother    Heart attack Brother    Kidney disease Brother        smoker; d. 55   Bipolar disorder Brother    Skin cancer Brother        described as a black mole   Diabetes Brother    Endometriosis Daughter    Other Daughter        hx of breast biopsies   Diabetes Paternal Aunt    AAA (abdominal aortic aneurysm) Paternal Aunt    Stroke Maternal Grandfather    Other Maternal Grandfather        had an operation on his intestines to address constipation   Stroke Paternal Aunt    Stroke Paternal Aunt    Diabetes Paternal Uncle    Cancer Cousin        d. NOS cancer (maybe liver or lung cancer); +EtOH   Diabetes Paternal Grandmother    Other Paternal Grandmother        intestinal issues   Stroke Paternal Grandfather    Bipolar disorder Other    Colon cancer Neg Hx    Rectal cancer Neg Hx    Stomach cancer Neg Hx    Esophageal cancer Neg Hx    Colon polyps Neg Hx    Social History   Socioeconomic History   Marital status: Married    Spouse name: Not on file   Number of children: 3   Years of education: Not on file   Highest education level: 12th grade  Occupational History   Occupation: Retired   Tobacco Use   Smoking status: Never   Smokeless tobacco: Never  Vaping Use   Vaping status: Never Used  Substance and Sexual Activity   Alcohol use: No  Alcohol/week: 0.0 standard drinks of alcohol   Drug use: No   Sexual activity: Not Currently  Other Topics Concern   Not on file  Social History Narrative   Married 1968   2 sons, 1 daughter lost one son in MVA   Full-time homemaker   Social Drivers of Health   Financial Resource Strain: Low Risk  (01/03/2024)   Overall Financial Resource Strain (CARDIA)    Difficulty of Paying Living Expenses: Not hard at all  Recent Concern: Financial Resource Strain - Medium Risk (01/01/2024)   Overall Financial Resource Strain (CARDIA)    Difficulty of Paying Living Expenses: Somewhat hard  Food Insecurity: No Food Insecurity (01/03/2024)   Hunger Vital Sign    Worried About Running Out of Food in the Last Year: Never true    Ran Out of Food in the Last Year: Never true  Transportation Needs: No Transportation Needs (01/03/2024)   PRAPARE - Administrator, Civil Service (Medical): No    Lack of Transportation (Non-Medical): No  Physical Activity: Inactive (01/03/2024)   Exercise Vital Sign    Days of Exercise per Week: 0 days    Minutes of Exercise per Session: 0 min  Stress: No Stress Concern Present (01/03/2024)   [employer] of Occupational Health - Occupational Stress Questionnaire    Feeling of Stress: Not at all  Recent Concern: Stress - Stress Concern Present (01/01/2024)   [employer] of Occupational Health - Occupational Stress Questionnaire    Feeling of Stress: To some extent  Social Connections: Moderately Isolated (01/03/2024)   Social Connection and Isolation Panel    Frequency of Communication with Friends and Family: More than three times a week    Frequency of Social Gatherings with Friends and Family: Never    Attends Religious Services: Never    Database administrator or Organizations:  No    Attends Engineer, structural: Never    Marital Status: Married    Tobacco Counseling Counseling given: Not Answered    Clinical Intake:  Pre-visit preparation completed: Yes  Pain : No/denies pain     BMI - recorded: 31.64 Nutritional Status: BMI > 30  Obese Nutritional Risks: None Diabetes: No  Lab Results  Component Value Date   HGBA1C 5.5 06/25/2023   HGBA1C 5.4 11/22/2022   HGBA1C 5.7 06/13/2021     How often do you need to have someone help you when you read instructions, pamphlets, or other written materials from your doctor or pharmacy?: 1 - Never  Interpreter Needed?: No  Information entered by :: Verdie Saba, CMA   Activities of Daily Living     01/03/2024    8:23 AM 01/01/2024   11:09 AM  In your present state of health, do you have any difficulty performing the following activities:  Hearing? 0 0  Vision? 0 0  Difficulty concentrating or making decisions? 0 0  Walking or climbing stairs? 1 1  Comment uses a walker   Dressing or bathing? 0 0  Doing errands, shopping? 1 1  Preparing Food and eating ? Y Y  Using the Toilet? N N  In the past six months, have you accidently leaked urine? Stephanie Pham Stephanie Pham  Comment wears a depend   Do you have problems with loss of bowel control? Stephanie Pham Stephanie Pham  Comment wears a depend   Managing your Medications? N N  Managing your Finances? N N  Housekeeping or managing your Housekeeping? Y Y  Comment Spouse helps  Patient Care Team: Joshua Debby CROME, MD as PCP - General (Internal Medicine) Okey Vina GAILS, MD as PCP - Cardiology (Cardiology) Ivin Kocher, MD as Consulting Physician (Dermatology) Patrcia Sharper, MD as Consulting Physician (Ophthalmology) Szabat, Toribio BROCKS, [facility] (Inactive) (Pharmacist) Patrcia Sharper, MD as Consulting Physician (Ophthalmology)  I have updated your Care Teams any recent Medical Services you may have received from other providers in the past year.     Assessment:   This is a  routine wellness examination for Stephanie Pham.  Hearing/Vision screen Hearing Screening - Comments:: Denies hearing difficulties   Vision Screening - Comments:: Wears rx glasses - sees Sharper Patrcia   Goals Addressed               This Visit's Progress     Patient Stated (pt-stated)        Patient stated she plans to get outside more and manage health better - tremors       Depression Screen     01/03/2024    8:26 AM 12/23/2023    9:41 AM 06/25/2023   10:35 AM 12/24/2022   10:51 AM 12/11/2021   10:38 AM 12/09/2020   10:20 AM 12/08/2020    9:31 AM  PHQ 2/9 Scores  PHQ - 2 Score 2 2 0 2 1 0 0  PHQ- 9 Score 6 7  6        Fall Risk     01/03/2024    8:25 AM 01/01/2024   11:09 AM 12/23/2023    9:40 AM 06/25/2023   10:34 AM 12/24/2022   10:51 AM  Fall Risk   Falls in the past year? 0 0 0 0 0  Number falls in past yr: 0 0 0 0 0  Injury with Fall? 0 0 0 0 0  Risk for fall due to : No Fall Risks  No Fall Risks No Fall Risks History of fall(s);Impaired mobility  Follow up Falls evaluation completed;Falls prevention discussed  Falls evaluation completed Falls evaluation completed Falls evaluation completed    MEDICARE RISK AT HOME:  Medicare Risk at Home Any stairs in or around the home?: Yes If so, are there any without handrails?: No Home free of loose throw rugs in walkways, pet beds, electrical cords, etc?: Yes Adequate lighting in your home to reduce risk of falls?: Yes Life alert?: No Use of a cane, walker or w/c?: Yes (walker) Grab Pham in the bathroom?: Yes Shower chair or bench in shower?: Yes Elevated toilet seat or a handicapped toilet?: Yes  TIMED UP AND GO:  Was the test performed?  No  Cognitive Function: 6CIT completed    10/31/2015    9:56 AM  MMSE - Mini Mental State Exam  Not completed: --        01/03/2024    8:31 AM 12/24/2022   10:52 AM 12/11/2021   10:45 AM 12/08/2019   11:03 AM  6CIT Screen  What Year? 0 points 0 points 0 points 0 points  What  month? 0 points 0 points 0 points 0 points  What time? 0 points 0 points 0 points 3 points  Count back from 20 0 points 0 points 0 points 0 points  Months in reverse 0 points 0 points 2 points 0 points  Repeat phrase 0 points 0 points 0 points 0 points  Total Score 0 points 0 points 2 points 3 points    Immunizations Immunization History  Administered Date(s) Administered   Fluad Quad(high Dose 65+) 11/27/2018, 12/08/2019, 12/08/2020,  12/06/2021   Fluad Trivalent(High Dose 65+) 11/22/2022   INFLUENZA, HIGH DOSE SEASONAL PF 12/27/2015, 11/21/2016, 12/23/2017   Influenza,inj,Quad PF,6+ Mos 02/09/2013, 11/17/2013, 12/22/2014   Influenza-Unspecified 12/08/2019   Pneumococcal Conjugate-13 12/28/2014   Pneumococcal Polysaccharide-23 12/27/2015, 12/06/2021   Tdap 02/09/2013   Tetanus 02/09/2013    Screening Tests Health Maintenance  Topic Date Due   Zoster Vaccines- Shingrix  (1 of 2) 03/24/2024 (Originally 12/10/1969)   Influenza Vaccine  06/16/2024 (Originally 10/18/2023)   DTaP/Tdap/Td (3 - Td or Tdap) 06/24/2024 (Originally 02/10/2023)   Mammogram  06/24/2024 (Originally 12/13/2022)   Colonoscopy  12/22/2024 (Originally 11/15/2021)   Medicare Annual Wellness (AWV)  01/02/2025   Pneumococcal Vaccine: 50+ Years  Completed   DEXA SCAN  Completed   Hepatitis C Screening  Completed   Meningococcal B Vaccine  Aged Out   COVID-19 Vaccine  Discontinued    Health Maintenance Items Addressed:  01/03/2024  Additional Screening:  Vision Screening: Recommended annual ophthalmology exams for early detection of glaucoma and other disorders of the eye. Is the patient up to date with their annual eye exam?  No  Who is the provider or what is the name of the office in which the patient attends annual eye exams? [facility]  Dental Screening: Recommended annual dental exams for proper oral hygiene  Community Resource Referral / Chronic Care Management: CRR required this visit?  No   CCM  required this visit?  No   Plan:    I have personally reviewed and noted the following in the patient's chart:   Medical and social history Use of alcohol, tobacco or illicit drugs  Current medications and supplements including opioid prescriptions. Patient is not currently taking opioid prescriptions. Functional ability and status Nutritional status Physical activity Advanced directives List of other physicians Hospitalizations, surgeries, and ER visits in previous 12 months Vitals Screenings to include cognitive, depression, and falls Referrals and appointments  In addition, I have reviewed and discussed with patient certain preventive protocols, quality metrics, and best practice recommendations. A written personalized care plan for preventive services as well as general preventive health recommendations were provided to patient.   Verdie CHRISTELLA Saba, CMA   01/03/2024   After Visit Summary: (MyChart) Due to this being a telephonic visit, the after visit summary with patients personalized plan was offered to patient via MyChart   Notes: Nothing significant to report at this time.

## 2024-01-06 ENCOUNTER — Other Ambulatory Visit: Payer: Self-pay | Admitting: Internal Medicine

## 2024-01-06 DIAGNOSIS — I10 Essential (primary) hypertension: Secondary | ICD-10-CM

## 2024-02-18 ENCOUNTER — Other Ambulatory Visit: Payer: Self-pay | Admitting: Internal Medicine

## 2024-02-18 DIAGNOSIS — F411 Generalized anxiety disorder: Secondary | ICD-10-CM

## 2024-03-08 ENCOUNTER — Other Ambulatory Visit: Payer: Self-pay | Admitting: Internal Medicine

## 2024-03-08 DIAGNOSIS — I10 Essential (primary) hypertension: Secondary | ICD-10-CM

## 2024-06-29 ENCOUNTER — Ambulatory Visit: Admitting: Internal Medicine

## 2025-01-05 ENCOUNTER — Encounter: Admitting: Internal Medicine

## 2025-01-05 ENCOUNTER — Ambulatory Visit
